# Patient Record
Sex: Female | Born: 1970 | Race: White | Hispanic: No | Marital: Married
Health system: Midwestern US, Academic
[De-identification: ages and names within clinical notes are randomized; demographics above are authoritative.]

## PROBLEM LIST (undated history)

## (undated) DIAGNOSIS — J45909 Unspecified asthma, uncomplicated: Secondary | ICD-10-CM

## (undated) LAB — PFT13-PULMONARY FUNCTION TEST
DLCO%: 124
DLCO: 26.2 ml/mmHg sec
FEV1%: 56
FEV1/FVC EXP: 81.03
FEV1/FVC: 48.69 %
FEV1: 1.73 liters
FVC%: 92
FVC: 3.56 liters
RV%: 149
RV: 2.8
TLC%: 107
TLC: 6.15 liters
VC%: 87
VC: 3.35

## (undated) LAB — TOX 9, UR
Amphetamine: >20000 ng/mL
Amphetamine: POSITIVE
Barbiturates: NEGATIVE
Benzodiazepines: NEGATIVE
Cocaine: NEGATIVE
Creatinine, Urine: 269 mg/dL
Mairjuana: NEGATIVE
Methadone, UR: NEGATIVE
Methamphetamine: NEGATIVE
Opiate Quant, Ur: NEGATIVE
PCP Quant, Ur: NEGATIVE
Propoxyphene: NEGATIVE

## (undated) LAB — HM COLOGUARD: HM ColoGuard: NEGATIVE

## (undated) LAB — HM PAP SMEAR
HM Pap smear: NEGATIVE
HM Pap smear: NEGATIVE
HM Pap smear: NORMAL

## (undated) LAB — TSH: TSH: 2.01 u[IU]/mL (ref 0.41–5.90)

## (undated) LAB — FERRITIN: Ferritin: 95

## (undated) LAB — VITAMIN D 25 HYDROXY: Vit D, 25-Hydroxy: 32.1

## (undated) MED FILL — DEXTROAMPHETAMINE-AMPHETAMINE 20 MG TABLET: 20 20 mg | ORAL | 30 days supply | Qty: 30 | Fill #0

## (undated) MED FILL — PEG 3350-ELECTROLYTES 236 GRAM-22.74 GRAM-6.74 GRAM-5.86 GRAM SOLUTION: 236-22.74-6.74 236-22.74-6.74 -5.86 gram | ORAL | 1 days supply | Qty: 4000 | Fill #0

## (undated) MED FILL — ALBUTEROL SULFATE HFA 90 MCG/ACTUATION AEROSOL INHALER: 90 90 mcg/actuation | RESPIRATORY_TRACT | 25 days supply | Qty: 6.7 | Fill #0

## (undated) MED FILL — MONTELUKAST 10 MG TABLET: 10 10 mg | ORAL | 90 days supply | Qty: 90

## (undated) MED FILL — BUPROPION HCL XL 150 MG 24 HR TABLET, EXTENDED RELEASE: 150 150 MG | ORAL | 90 days supply | Qty: 90 | Fill #0

## (undated) MED FILL — ESCITALOPRAM 20 MG TABLET: 20 20 MG | ORAL | 90 days supply | Qty: 90

## (undated) MED FILL — ESCITALOPRAM 20 MG TABLET: 20 20 MG | ORAL | 90 days supply | Qty: 90 | Fill #0

---

## 2005-06-18 NOTE — Unmapped (Signed)
Signed by Cleone Slim MD on 06/18/2005 at 15:15:58      Reason for Visit   Chief Complaint: establishing new pcp    History from: patient    Allergies  No Known Allergies    Medications     LEXAPRO 20 MG TABS (ESCITALOPRAM OXALATE) 1 tab po qd  ADDERALL XR 30 MG CP24 (AMPHETAMINE-DEXTROAMPHETAMINE) 1 tab po qd  ADDERALL 5 MG TABS (AMPHETAMINE-DEXTROAMPHETAMINE) 1 tab po qd  ALBUTEROL SULFATE NEBU (ALBUTEROL SULFATE NEBU) 1-2 puffs prn  ADVAIR DISKUS 250-50 MCG/DOSE MISC (FLUTICASONE-SALMETEROL) 1 puff qam 1 puff qhs  CLARITIN 10 MG TABS (LORATADINE) 1 tab po qd        Vital Signs:   Wt: 124 lbs.      Pulse: 86 (regular)  Resp: 16  BP: 110/62    Pulse Oximetry:   O2 Saturation: 100 % w/ room air Intake recorded by: Les Pou on June 18, 2005 10:20 AM    History of Present Illness: ADD    concentration problems: ongoing despite tx  dx 1.5 yrs ago  tx: adderall helpful x as above (no s/e reported); finds sx in pm less well controlled w/ 5mg  pm dose  past: stratera: ineffective  concerta: migraines        ATTENTION DEFICIT/ HYPERACTIVE SYMPTOMS     Depression   Chief Complaint: depression  Basis for diagnosis: previous history of depression    Associated Symptoms:   Complains of: feeling depressed  Denies: no S/I  Other: no real anxiety  Non-pharmacologic treatment: counseling, exercise  Compliance with tx: good  Medication Tolerance: good  Comments: counselling: marital    stressors: moved here from [place], marital issues, family health issues  Symptoms are: getting worse    Past History  Past Medical History:  Environmental Allergies, Asthma, cervical dysplasia, Anxiety, Depression, ADD    Surgical History:  conization 1991      Family History: father: htn  mother: cancer (?uterine)  siblings: A&W    Social History: Marital Status: married,   Employment Status: homemaker,   Alcohol Use: occasionally  Tobacco Usage:non-smoker      Review of Systems   General: Denies fatigue, weight loss, weight gain.      Respiratory: inc sx since ran out v advair & recent bronchitis (past family doctor suggested advair 500 might be more effective); peak flows usually 300's, not checkign recently  Psychiatric: Complains of see HPI.   Allergic/Immunologic: Complains of hay fever. (mild: compliant with otc claritin)      Physical Examination:     General Appearance: well developed, well nourished, in no acute distress.    Skin Palpation: Normal turgor.    Respiratory:     Effort: No apparent respiratory distress.  Auscultation: Clear to auscultation.    Cardiovascular:     Auscultation: No murmur, normal s1, normal s2.  Edema/Veins: No edema.    Abdomen   Palpation/Auscultation: Non-tender, normal bowel sounds.  Liver/Spleen:  : No organomegaly.    Psychological     Judgement/Insight: behavior within normal limits.    Orientation: oriented to person, oriented to place, oriented to time.    Memory: intact to recent events, intact to remote events.    Mood: appropriate mental status.      Assessment and Plan  Status of Existing Problems:  Assessed ADD as deteriorated - Ceasar Decandia [name] MD  Assessed DEPRESSION as deteriorated - Wilmetta Speiser [name] MD  Assessed ASTHMA NOS W/O STATUS ASTHMATICUS as deteriorated - Nitza Schmid  [name] MD  New Problems:  Dx of ASTHMA NOS W/O STATUS ASTHMATICUS (ICD-493.90)   Dx of ADD (ICD-314.00)   Dx of DEPRESSION (ICD-311)     Medications   New Prescriptions/Refills:  ADVAIR DISKUS 500-50 MCG/DOSE MISC (FLUTICASONE-SALMETEROL) 1 puff bid  #1 diskus x 0 : Alston Berrie [name] MD (06/18/2005)  ADDERALL (10MG )  TAB (AMPHETAMINE-DEXTROAMPHETAMINE) 1 by mouth every afternoon uad  #30 x 0 : Talissa Apple [name] MD (06/18/2005)  ADDERALL XR 30 MG CP24 (AMPHETAMINE-DEXTROAMPHETAMINE) 1 tab po qd  #30 x 0 : Cassondra Stachowski K Lenni Reckner MD (06/18/2005)  ALBUTEROL SULFATE NEBU (ALBUTEROL SULFATE NEBU) 1-2 puffs prn  #30 x 5 : Ethal Gotay K Karena Kinker MD (06/18/2005)  LEXAPRO 20 MG TABS (ESCITALOPRAM OXALATE) 1 tab po qd  #30 x 5 : Sahaj Bona K Teniyah Seivert MD  (06/18/2005)  WELLBUTRIN XL 150 MG TB24 (BUPROPION HCL) one po qd  #30 x 5 : Ailen Strauch [name] MD (06/18/2005)    New medications:  LEXAPRO 20 MG TABS -- 1 tab po qd  ADDERALL XR 30 MG CP24 -- 1 tab po qd  ADDERALL (10MG )  TAB -- 1 by mouth every afternoon uad  Start date: 06/18/2005  ALBUTEROL SULFATE NEBU -- 1-2 puffs prn  ADVAIR DISKUS 500-50 MCG/DOSE MISC -- 1 puff bid  Start date: 06/18/2005  CLARITIN 10 MG TABS -- 1 tab po qd  WELLBUTRIN XL 150 MG TB24 -- one po qd  Start date: 06/18/2005    Assessment and Plan Comments     1035  get records  depn: add WBXL low dose, cont lexapro & councelling  add: inc pm dose v adderall  asthma: temp'ly inc advair to 500.  PREVENTATIVE FEMALE:  pap smear:utd  lipids: utd    Today's Orders   14782 - Ofc Vst, New Level III [CPT-99203]    Disposition:   Return to clinic for Doctor Visit in 1 month(s)               Prescriptions:  ADVAIR DISKUS 500-50 MCG/DOSE MISC (FLUTICASONE-SALMETEROL) 1 puff bid  #1 diskus x 0   Entered and Authorized by: Keiasia Christianson [name] MD   Signed by: Cleone Slim MD on 06/18/2005   Method used: Faxed to ...     [facility]     [address]     [place], [place]  [address]     Ph: [phone]     Fax: [phone]  ADDERALL (10MG )  TAB (AMPHETAMINE-DEXTROAMPHETAMINE) 1 by mouth every afternoon uad  #30 x 0   Entered and Authorized by: Pranish Akhavan [name] MD   Signed by: Cleone Slim MD on 06/18/2005   Method used: Print then Give to Patient  ADDERALL XR 30 MG CP24 (AMPHETAMINE-DEXTROAMPHETAMINE) 1 tab po qd  #30 x 0   Entered and Authorized by: Carston Riedl [name] MD   Signed by: Cleone Slim MD on 06/18/2005   Method used: Print then Give to Patient  ALBUTEROL SULFATE NEBU (ALBUTEROL SULFATE NEBU) 1-2 puffs prn  #30 x 5   Entered and Authorized by: Jamise Pentland [name] MD   Signed by: Cleone Slim MD on 06/18/2005   Method used: Faxed to ...     [facility] [address]     [address]     [place], [place]   [address]     Ph: [phone]     Fax: [phone]  LEXAPRO 20 MG TABS (ESCITALOPRAM OXALATE) 1 tab po qd  #  30 x 5   Entered and Authorized by: Kyrell Ruacho [name] MD   Signed by: Cleone Slim MD on 06/18/2005   Method used: Faxed to ...     [facility] [address]     [address]     [place], [place]  [address]     Ph: [phone]     Fax: [phone]  WELLBUTRIN XL 150 MG TB24 (BUPROPION HCL) one po qd  #30 x 5   Entered and Authorized by: Jantz Main [name] MD   Signed by: Cleone Slim MD on 06/18/2005   Method used: Faxed to ...     [facility] [address]     [address]     [place], [place]  [address]     Ph: [phone]     Fax: [phone]            ]

## 2005-06-23 NOTE — Unmapped (Signed)
Signed by Les Pou on 06/24/2005 at 13:39:42    Phone Note   Call from Pharmacy  Department: Family Medicine  Call for: [facility]    Caller: [facility]  Pharmacy Phone Number: [phone]    FAX: [phone]  Summary of call: WANT TO VERIFY    RCV'D RX FOR ALBUTERAL  NEBU -- DIRECTIONS OF 1-2 PUFFS.  DOES MD WANT NEBULIZER SOLN OR MDI?   Initial call taken by: Anders Simmonds,  June 23, 2005 11:29 AM    Follow-up for Phone Call   ofc to call pt & clarify refill  Follow-up by: Cleone Slim MD,  June 23, 2005 3:10 PM    Additional Follow-up for Phone Call   called patient and left message  Additional Follow-up by: Tia Little,  June 23, 2005 5:22 PM    Additional Follow-up for Phone Call   called patient and left message to call ofc back in regards to rx clarification; no answer; will wait for response  Additional Follow-up by: Tia Little,  June 24, 2005 1:39 PM

## 2005-07-01 NOTE — Unmapped (Signed)
Signed by Lynetta Mare MA on 07/01/2005 at 17:51:54    Phone Note   Call from Pharmacy  Department: Family Medicine  Call for: [facility]    Caller: [facility]  Pharmacy Phone Number: [phone]  Summary of call: NEED TO VERIFY ALBUTEROL - NEBULIZER OR INHALER  Initial call taken by: Verlene Mayer,  July 01, 2005 8:51 AM    Follow-up for Phone Call   called and verified rx with pharmacy  Follow-up by: Fulton Mole MA,  July 01, 2005 5:51 PM

## 2005-07-16 NOTE — Unmapped (Signed)
Signed by Cleone Slim MD on 07/16/2005 at 14:57:33    [facility]  [address]  [address]  [place], [place] [address]  Ph: [phone]  Fax: [phone]     July 16, 2005        [facility]  [address] [place], [place] [address]          RE:  TEST RESULTS    Dear  Ms. Leatherbury,    The following is an interpretation of your most recent tests.  Please take note of any instructions provided or changes to medications that have resulted from your lab work.      X-RAY: Xray results were normal.      Sincerely,        Britteny Fiebelkorn [name] MD

## 2005-07-16 NOTE — Unmapped (Signed)
Signed by Cleone Slim MD on 07/16/2005 at 14:56:33  Patient: Mary [name]  Note: All result statuses are Final unless otherwise noted.    Tests: (1) DIAG-HIP MIN 2-VIEWS LT (562130)    Order NotePricilla Handler Order Number: [id number]    Order Note:     *** VERIFIED ***  [facility]  Reason:  PAIN  Dict.Staff: OJEDA-FOURNIER, HAYDEE    Verified By: Lovenia Shuck   Ver: 07/16/05   2:24 pm  Exams:  DIAG-HIP MIN 2-VIEWS LT      Left hip 2 views dated 07/16/2005.    Clinical History: 34 year old female left hip pain.    Findings:    There are no osseous, articular, or soft tissue abnormalities.    Impression:    Normal appearance to the left hip.  **** end of result ****    Note: An exclamation mark (!) indicates a result that was not dispersed into   the flowsheet.  Document Creation Date: 07/16/2005 2:22 PM  _______________________________________________________________________    (1) Order result status: Final  Collection or observation date-time: 07/16/2005 12:19:00  Requested date-time: 07/16/2005 12:19:00  Receipt date-time:   Reported date-time: 07/16/2005 14:24:43  Referring Physician: Cindi Carbon NON-STAFF  Ordering Physician: Melissa Montane [facility])  Specimen Source:   Source: Duanne Guess Order Number: [id number]  Lab site: [facility]

## 2005-07-16 NOTE — Unmapped (Signed)
Signed by Cleone Slim MD on 07/16/2005 at 12:12:47      Reason for Visit   Chief Complaint: evaluate medications    History from: patient    Allergies  No Known Allergies    Medications   LEXAPRO 20 MG TABS (ESCITALOPRAM OXALATE) 1 tab po qd  ADDERALL XR 30 MG CP24 (AMPHETAMINE-DEXTROAMPHETAMINE) 1 tab po qd  ADDERALL (10MG )  TAB (AMPHETAMINE-DEXTROAMPHETAMINE) 1 by mouth every afternoon uad  ALBUTEROL SULFATE NEBU (ALBUTEROL SULFATE NEBU) 1-2 puffs prn  ADVAIR DISKUS 500-50 MCG/DOSE MISC (FLUTICASONE-SALMETEROL) 1 puff bid  CLARITIN 10 MG TABS (LORATADINE) 1 tab po qd  WELLBUTRIN XL 150 MG TB24 (BUPROPION HCL) one po qd        Vital Signs:   Wt: 126 lbs.      Pulse: 68    Blood Pressure #1: 124/90  Blood Pressure #2: 118/86    Calculations:   Wt chg (lbs): 2  Intake recorded by: Fulton Mole MA on July 16, 2005 11:04 AM          HPI Multiple Chronic   Condition(s): Anxiety/Depression, Asthma.  Additional Dx: ADD  Comments: last visit inc adderall b/o afternoon sx  also started WBXL    Current Status:   Compliance with tx: good  Medication Tolerance: good    ROS/Symptoms:   Patient denies the following respiratory symptoms: uri exposure: sl inc chest tightness  no fever, wheezing; not checking pefr  Patient denies the following psychological symptoms: depression better  concentration better       Review of Systems   Cardiovascular: recent brief (?3 mins) palitaitons, ? irreg; spon resolution, no recurrence since then  Respiratory: Complains of see HPI.   Musculoskeletal: l hip: 12 months, past present during pregnancies; worse w/ prolonged sitting  Psychiatric: Complains of see HPI.       Physical Examination:     General Appearance: well developed, well nourished, in no acute distress.      Neck/Thyroid   Neck/Thyroid:  No thyromegaly.    Respiratory:     Effort: No apparent respiratory distress.  Auscultation: Clear to auscultation.    Cardiovascular:     Auscultation: No murmur, normal s1, normal  s2.    Psychological     Judgement/Insight: Behavior within normal limits.  Mood: Appropriate mental status.      Extremities   LLE Inspection/Palpation Intact, no tenderness.  LLE Range of Motion: Full rom.some discomfort v l hip w/ ext rot'n  Assessment and Plan  Status of Existing Problems:  Assessed ADD as improved - Miriya Cloer [name] MD  Assessed DEPRESSION as improved - Javohn Basey [name] MD  Assessed ASTHMA NOS W/O STATUS ASTHMATICUS as unchanged - Kareli Hossain [name] MD  New Problems:  Dx of HIP PAIN [phone])  Onset: 07/16/2005    Medications   New Prescriptions/Refills:  ADVAIR DISKUS 250-50 MCG/DOSE MISC (FLUTICASONE-SALMETEROL) 1 inhale twice a day  #1 diskus x 4 : Khalil Szczepanik [name] MD (07/16/2005)  ADVAIR DISKUS 500-50 MCG/DOSE MISC (FLUTICASONE-SALMETEROL) 1 puff bid  #1 diskus x 0 : Ludwin Flahive [name] MD (07/16/2005)  ADDERALL (10MG )  TAB (AMPHETAMINE-DEXTROAMPHETAMINE) 1 by mouth every afternoon uad  #30 x 0 : Leelynd Maldonado K Oksana Deberry MD (07/16/2005)  ADDERALL XR 30 MG CP24 (AMPHETAMINE-DEXTROAMPHETAMINE) 1 tab po qd  #30 x 0 : Merick Kelleher [name] MD (07/16/2005)    New medications:  ADVAIR DISKUS 250-50 MCG/DOSE MISC -- 1 inhale twice a day    Assessment and Plan Comments  1. depn: cont w/ current tx  2. VOZ:DGUY w/ current tx, can try dec pm dose v adderall later  3. asthma: cont advair 500 b/o viral exposure for now then dec back to 250.; monitor pefr  4. palpitations: reassured as single episode  5. l  hip pain: xray, prn otc nsaids    Today's Orders   X-ray, Hip, complete, 2+ views [CPT-73510]  99214 - Ofc Vst, Est Level IV [[id number]]    Disposition:   Return to clinic for Doctor Visit in 1 month(s)   Appointment Reason: add & asthma & hip pain              Prescriptions:  ADVAIR DISKUS 250-50 MCG/DOSE MISC (FLUTICASONE-SALMETEROL) 1 inhale twice a day  #1 diskus x 4   Entered and Authorized by: Serra Younan [name] MD   Signed by: Cleone Slim MD on 07/16/2005   Method used: Print then Give to Patient  ADVAIR DISKUS 500-50 MCG/DOSE  MISC (FLUTICASONE-SALMETEROL) 1 puff bid  #1 diskus x 0   Entered and Authorized by: Daquavion Catala [name] MD   Signed by: Cleone Slim MD on 07/16/2005   Method used: Print then Give to Patient  ADDERALL (10MG )  TAB (AMPHETAMINE-DEXTROAMPHETAMINE) 1 by mouth every afternoon uad  #30 x 0   Entered and Authorized by: Corderro Koloski [name] MD   Signed by: Cleone Slim MD on 07/16/2005   Method used: Print then Give to Patient  ADDERALL XR 30 MG CP24 (AMPHETAMINE-DEXTROAMPHETAMINE) 1 tab po qd  #30 x 0   Entered and Authorized by: Tron Flythe [name] MD   Signed by: Cleone Slim MD on 07/16/2005   Method used: Print then Give to Patient            ]

## 2005-08-17 NOTE — Unmapped (Signed)
Signed by Les Pou on 08/17/2005 at 16:33:55    Phone Note   Call from Patient  Call back at Home Phone: [phone]  Caller: patient  Department: Family Medicine  Call for: [facility]    Summary of Call:  *** Prescription Request ***    Requesting Medication:  ADAEROL XR  Rx. Number:     Dose/ Strength: 30MG    Instructions: 1 DAILY    Quantity:    Last Filled Date:    Last Filled By:    Pharmacy Name:    Pharmacy Phone #:    Pharmacy Fax #:    UCP Provider Name:       *** Prescription Request ***    Requesting Medication:  ADEROL  Rx. Number:     Dose/ Strength: 10MG    Instructions: 1 DAILY   Quantity:    Last Filled Date:    Last Filled By:    Pharmacy Name:    Pharmacy Phone #:    Pharmacy Fax #:    UCP Provider Name:         PLEASE CALL WHEN RX READY FOR PICK UP          Initial call taken by: Anders Simmonds,  August 17, 2005 1:11 PM    Prescriptions:  ADDERALL (10MG )  TAB (AMPHETAMINE-DEXTROAMPHETAMINE) 1 by mouth every afternoon uad  #30 x 0   Entered by: Tia Little   Authorized by: Pranathi Winfree [name] MD   Signed by: Les Pou on 08/17/2005   Method used: Print then Give to Patient  ADDERALL XR 30 MG CP24 (AMPHETAMINE-DEXTROAMPHETAMINE) 1 tab po qd  #30 x 0   Entered by: Tia Little   Authorized by: Vivika Poythress [name] MD   Signed by: Les Pou on 08/17/2005   Method used: Print then Give to Patient

## 2005-09-20 NOTE — Unmapped (Signed)
Signed by Cleone Slim MD on 09/20/2005 at 16:30:46    Phone Note   Call from Patient  Call back at Home Phone: [phone]  Patient Cell Phone #: [phone]  Caller: patient  Department: Family Medicine  Call for: [facility]    Summary of Call:  ADDERALL 30MG    ADDERALL 10MG     PT WANTS WANTS TO PICK UP SCRIPTS FOR THEM. CALL HER  AT HOME OR CELL PHONE TO LET HER KNOW WHEN TO PICK  UP.       Initial call taken by: Earnest Conroy,  September 20, 2005 10:41 AM      Follow-up for Phone Call   1 month refll ok  must b seen next month for refills  Follow-up by: Cleone Slim MD,  September 20, 2005 4:30 PM            Prescriptions:  ADDERALL (10MG )  TAB (AMPHETAMINE-DEXTROAMPHETAMINE) 1 by mouth every afternoon uad  #30 x 0   Entered and Authorized by: Zymier Rodgers [name] MD   Signed by: Cleone Slim MD on 09/20/2005   Method used: Print then Give to Patient  ADDERALL XR 30 MG CP24 (AMPHETAMINE-DEXTROAMPHETAMINE) 1 tab po qd  #30 x 0   Entered and Authorized by: Moshe Wenger [name] MD   Signed by: Cleone Slim MD on 09/20/2005   Method used: Print then Give to Patient

## 2005-10-22 NOTE — Unmapped (Signed)
Signed by Cleone Slim MD on 10/22/2005 at 16:19:04      Reason for Visit   Chief Complaint: medication Adderall follow up. c/o back, jaw and bilateral shoulder pain    History from: patient    Allergies  No Known Allergies    Medications   LEXAPRO 20 MG TABS (ESCITALOPRAM OXALATE) 1 tab po qd  ADDERALL XR 30 MG CP24 (AMPHETAMINE-DEXTROAMPHETAMINE) 1 tab po qd  ADDERALL (10MG )  TAB (AMPHETAMINE-DEXTROAMPHETAMINE) 1 by mouth every afternoon uad  ALBUTEROL SULFATE NEBU (ALBUTEROL SULFATE NEBU) 1-2 puffs prn  ADVAIR DISKUS 500-50 MCG/DOSE MISC (FLUTICASONE-SALMETEROL) 1 puff bid  CLARITIN 10 MG TABS (LORATADINE) 1 tab po qd  WELLBUTRIN XL 150 MG TB24 (BUPROPION HCL) one po qd        Vital Signs:   Wt: 132 lbs.      Temperature: 98.1  degrees F   oral  Pulse: 64  BP: 122/82    Calculations:   Wt chg (lbs): 6  Intake recorded by: Verl Dicker on October 22, 2005 2:46 PM          ATTENTION DEFICIT/ HYPERACTIVE SYMPTOMS          FU ADD    Adderall 30mg  qam, 10mg  qpm: doing well x has noticed ? inc circ sx (see ROS).    Past History  Past Medical History (reviewed - no changes required):  Environmental Allergies, Asthma, cervical dysplasia, Anxiety, Depression, ADD    Social History (reviewed - no changes required): Marital Status: married,   Employment Status: employed part-time,   Occupation: Chemical engineer  Alcohol Use: occasionally  Tobacco Usage:non-smoker      Review of Systems   Cardiovascular: episodic hands blood rush: better w/ putting hands in cold water!; not positional  Musculoskeletal: recently having neck/back pain; + h/o TMJ; past NSAIDS +/- t#3  Psychiatric: Complains of see HPI.       Physical Examination:     General Appearance: well developed, well nourished, in no acute distress.      Cardiovascular:     Right Pedal/Radial Pulses: symmetric radial pulse + 2.  normal cap refill  Left Pedal/Radial Pulses: symmetric radial pulse + 2.  normal cap refill    Psychological     Judgement/Insight: good  insight and judgement.    Mood: affect and mood appropriate.      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)     Medications   New Prescriptions/Refills:  TYLENOL/CODEINE #3 TABS (ACETAMINOPHEN-CODEINE TABS) 1 po tid prn pain  #30 x 0 : Markitta Ausburn [name] MD (10/22/2005)  DAYPRO 600 MG TABS (OXAPROZIN) 1 po BID with food prn  #60 x 0 : Nicole Hafley K Makira Holleman MD (10/22/2005)  WELLBUTRIN XL 150 MG TB24 (BUPROPION HCL) one po qd  #30 x 5 : Jessiah Steinhart K Foxx Klarich MD (10/22/2005)  LEXAPRO 20 MG TABS (ESCITALOPRAM OXALATE) 1 tab po qd  #30 x 5 : Roy Tokarz K Marvell Stavola MD (10/22/2005)  ADVAIR DISKUS 500-50 MCG/DOSE MISC (FLUTICASONE-SALMETEROL) 1 puff bid  #1 diskus x 5 : Zani Kyllonen [name] MD (10/22/2005)  ADDERALL (10MG )  TAB (AMPHETAMINE-DEXTROAMPHETAMINE) 1 by mouth every afternoon uad  #30 x 0 : Rickeya Manus [name] MD (10/22/2005)  ADDERALL XR 30 MG CP24 (AMPHETAMINE-DEXTROAMPHETAMINE) 1 tab po qd  #30 x 0 : Kacin Dancy K Thedore Mins MD (10/22/2005)  ADDERALL (10MG )  TAB (AMPHETAMINE-DEXTROAMPHETAMINE) 1 by mouth every afternoon uad  #30 x 0 : Vadhir Mcnay K Latrell Potempa MD (10/22/2005)  ADDERALL XR 30 MG CP24 (AMPHETAMINE-DEXTROAMPHETAMINE) 1 tab po qd  #30 x 0 : Taimane Stimmel K Jacarra Bobak MD (10/22/2005)  ADDERALL (10MG )  TAB (AMPHETAMINE-DEXTROAMPHETAMINE) 1 by mouth every afternoon uad  #30 x 0 : Danuel Felicetti K Shatonya Passon MD (10/22/2005)  ADDERALL XR 30 MG CP24 (AMPHETAMINE-DEXTROAMPHETAMINE) 1 tab po qd  #30 x 0 : Maricus Tanzi [name] MD (10/22/2005)    New medications:  DAYPRO 600 MG TABS -- 1 po BID with food prn  Start date: 10/22/2005  TYLENOL/CODEINE #3 TABS -- 1 po tid prn pain  Start date: 10/22/2005    Assessment and Plan Comments       1.  ADD; stable w/ current tx, cont  2. ? Raynauds: not typical. reluctant to try nifedipine, refer to vasc  surgeon  3. Depn: refill meds  4. asthma: refill advair  5. TMJ: prn DP & T#3 prn    Today's Orders   Vascular Consult [[id number]]  54098 - Ofc Vst, Est Level IV [[id number]]    Disposition:   Return to clinic for Doctor Visit in 3 month(s)               Prescriptions:  TYLENOL/CODEINE #3 TABS (ACETAMINOPHEN-CODEINE TABS) 1 po tid prn pain  #30 x 0   Entered and Authorized by: Keylie Beavers [name] MD   Signed by: Cleone Slim MD on 10/22/2005   Method used: Print then Give to Patient  DAYPRO 600 MG TABS (OXAPROZIN) 1 po BID with food prn  #60 x 0   Entered and Authorized by: Ranessa Kosta [name] MD   Signed by: Cleone Slim MD on 10/22/2005   Method used: Print then Give to Patient  WELLBUTRIN XL 150 MG TB24 (BUPROPION HCL) one po qd  #30 x 5   Entered and Authorized by: Denny Lave [name] MD   Signed by: Cleone Slim MD on 10/22/2005   Method used: Print then Give to Patient  LEXAPRO 20 MG TABS (ESCITALOPRAM OXALATE) 1 tab po qd  #30 x 5   Entered and Authorized by: Fayette Gasner [name] MD   Signed by: Cleone Slim MD on 10/22/2005   Method used: Print then Give to Patient  ADVAIR DISKUS 500-50 MCG/DOSE MISC (FLUTICASONE-SALMETEROL) 1 puff bid  #1 diskus x 5   Entered and Authorized by: Arlin Savona [name] MD   Signed by: Cleone Slim MD on 10/22/2005   Method used: Print then Give to Patient  ADDERALL (10MG )  TAB (AMPHETAMINE-DEXTROAMPHETAMINE) 1 by mouth every afternoon uad  #30 x 0   Entered and Authorized by: Amya Hlad [name] MD   Signed by: Cleone Slim MD on 10/22/2005   Method used: Print then Give to Patient  ADDERALL XR 30 MG CP24 (AMPHETAMINE-DEXTROAMPHETAMINE) 1 tab po qd  #30 x 0   Entered and Authorized by: Blanca Thornton [name] MD   Signed by: Cleone Slim MD on 10/22/2005   Method used: Print then Give to Patient  ADDERALL (10MG )  TAB (AMPHETAMINE-DEXTROAMPHETAMINE) 1 by mouth every afternoon uad  #30 x 0   Entered and Authorized by: Xitlali Kastens [name] MD   Signed by: Cleone Slim MD on 10/22/2005   Method used: Print then Give to  Patient  ADDERALL XR 30 MG CP24 (AMPHETAMINE-DEXTROAMPHETAMINE) 1 tab po qd  #30 x 0   Entered and Authorized by: Ahniya Mitchum [name] MD   Signed by: Cleone Slim MD on 10/22/2005   Method used: Print then Give to Patient  ADDERALL (10MG )  TAB (AMPHETAMINE-DEXTROAMPHETAMINE) 1 by mouth every afternoon uad  #30 x 0   Entered and Authorized by: Aldyn Toon [name] MD   Signed by: Cleone Slim MD on 10/22/2005   Method used: Print then Give to Patient  ADDERALL XR 30 MG CP24 (AMPHETAMINE-DEXTROAMPHETAMINE) 1 tab po qd  #30 x 0   Entered and Authorized by: Xzayvier Fagin [name] MD   Signed by: Cleone Slim MD on 10/22/2005   Method used: Print then Give to Patient            ]

## 2006-01-24 NOTE — Unmapped (Signed)
Signed by Cleone Slim MD on 01/24/2006 at 12:38:47    Phone Note   Patient Call  Callers Cell Phone #: [phone]  Caller: patient  Department: Family Medicine  Call for: [facility]    Reason for Call: refill medication  Summary of Call: ADEROL XR 1 X DAY  ADEROL 10MG  1 X DAY   COMPLETELY OUT OF ADEROL XR  PLEASE CALL : [phone] WHEN READY FOR PICK UP            Initial call taken by: Anders Simmonds,  January 24, 2006 9:57 AM      Follow-up for Phone Call   we cant phone in adderall    must make a fu appt for refills  Follow-up by: Cleone Slim MD,  January 24, 2006 10:42 AM  Prescriptions:  ADDERALL (10MG )  TAB (AMPHETAMINE-DEXTROAMPHETAMINE) 1 by mouth every afternoon uad  #30 x 0   Entered and Authorized by: Deaunna Olarte [name] MD   Signed by: Cleone Slim MD on 01/24/2006   Method used: Print then Give to Patient  ADDERALL XR 30 MG CP24 (AMPHETAMINE-DEXTROAMPHETAMINE) 1 tab po qd  #30 x 0   Entered and Authorized by: Decorey Wahlert [name] MD   Signed by: Cleone Slim MD on 01/24/2006   Method used: Print then Give to Patient

## 2006-02-17 NOTE — Unmapped (Signed)
Signed by Verl Dicker MA on 02/18/2006 at 18:36:25    Phone Note   Patient Call  Callers Cell Phone #: [phone]  Caller: patient  Department: Family Medicine  Call for: [facility]    Reason for Call: refill medication  Summary of Call: REFILL:    ADDERALL XR AND ADDERALL 10MG   ONLY 1 OR 2 TAB LEFT     CALL CELL WHEN READY FOR PICK UP      Initial call taken by: Anders Simmonds,  Feb 17, 2006 12:06 PM      Follow-up for Phone Call   can see tomorrow  its a restricted med: cant phone in, supposed to make appt for refills  Follow-up by: Cleone Slim MD,  Feb 17, 2006 1:00 PM    Additional Follow-up for Phone Call   Pt seen today in office.  phone call completed  Additional Follow-up by: Fulton Mole MA,  Feb 17, 2006 1:10 PM

## 2006-02-18 NOTE — Unmapped (Signed)
Signed by Cleone Slim MD on 02/18/2006 at 10:28:02      Reason for Visit   Chief Complaint: Adderall use follow up and refill.    History from: patient    Allergies  No Known Allergies    Medications   LEXAPRO 20 MG TABS (ESCITALOPRAM OXALATE) 1 tab po qd  ADDERALL XR 30 MG CP24 (AMPHETAMINE-DEXTROAMPHETAMINE) 1 tab po qd  ADDERALL (10MG )  TAB (AMPHETAMINE-DEXTROAMPHETAMINE) 1 by mouth every afternoon uad  ALBUTEROL SULFATE NEBU (ALBUTEROL SULFATE NEBU) 1-2 puffs prn  ADVAIR DISKUS 500-50 MCG/DOSE MISC (FLUTICASONE-SALMETEROL) 1 puff bid  CLARITIN 10 MG TABS (LORATADINE) 1 tab po qd  WELLBUTRIN XL 150 MG TB24 (BUPROPION HCL) one po qd        Vital Signs:   Wt: 127 lbs.      Temperature: 97.8  degrees F   oral  Pulse: 70  BP: 122/80    Pulse Oximetry:   O2 Saturation: 100 %   Calculations:   Wt chg (lbs): -5  Intake recorded by: Verl Dicker on Feb 18, 2006 9:53 AM    Peak Flow   Peak Flow: 380 (goal 500) L/min.          ATTENTION DEFICIT/ HYPERACTIVE SYMPTOMS            ADD: doing ok  concentration: able to stay focused but seems to forget things easily  Tx: Adderall dosing a/t work shifts w/ prn extra 10mg   s/e: none reported.    Past History  Past Medical History (reviewed - no changes required):  Environmental Allergies, Asthma, cervical dysplasia, Anxiety, Depression, ADD  Family History: father: htn  mother: cancer (?uterine)  siblings: A&W  children: asthma/ allergy  Social History (reviewed - no changes required): Marital Status: married,   Employment Status: employed part-time,   Occupation: Charity fundraiser @ [employer]  Alcohol Use: occasionally  Tobacco Usage:non-smoker    Review of Systems   Respiratory: recent mild inc in asthma sx: compliant with advair; using albutrol 1-2x/d  has PF meter, rarely checks it  Psychiatric: Complains of see HPI. depn well controlled w/ both lexapro & WBXL      Physical Examination:     General Appearance: well developed, well nourished, in no acute distress.      Respiratory:        Effort: No apparent respiratory distress.  Auscultation: Clear to auscultation.    Cardiovascular:     Auscultation: No murmur, normal s1, normal s2.    Psychological     Judgement/Insight: Good insight and judgement, behavior within normal limits.  Mood: Appropriate mental status.    Assessment and Plan  Status of Existing Problems:  Assessed ADD as deteriorated - Grizelda Piscopo [name] MD  Assessed DEPRESSION as improved - Ashlan Dignan [name] MD  Assessed ASTHMA NOS W/O STATUS ASTHMATICUS as deteriorated - Tonnie Stillman [name] MD    Medications   New Prescriptions/Refills:  SINGULAIR 10 MG TAB (MONTELUKAST SODIUM) 1 po daily  #30 x 5 : Jermone Geister [name] MD (02/18/2006)  ADDERALL (10MG )  TAB (AMPHETAMINE-DEXTROAMPHETAMINE) 1 by mouth every afternoon uad  #30 x 0 : Marshel Golubski [name] MD (02/18/2006)  ADDERALL (10MG )  TAB (AMPHETAMINE-DEXTROAMPHETAMINE) 1 by mouth every afternoon uad  #30 x 0 : Dashauna Heymann K Carroll Ranney MD (02/18/2006)  ADDERALL XR 15 MG CP24 (AMPHETAMINE-DEXTROAMPHETAMINE) 3 tablets by mouth daily  #90 x 0 : Amar Sippel [name] MD (02/18/2006)  ADDERALL (10MG )  TAB (AMPHETAMINE-DEXTROAMPHETAMINE) 1 by mouth every afternoon uad  #30  x 0 : Clarissa Laird [name] MD (02/18/2006)  ADDERALL XR 15 MG CP24 (AMPHETAMINE-DEXTROAMPHETAMINE) 3 tablets by mouth daily  #90 x 0 : Haedyn Breau K Yanelle Sousa MD (02/18/2006)  ADDERALL XR 15 MG CP24 (AMPHETAMINE-DEXTROAMPHETAMINE) 3 tablets by mouth daily  #90 x 0 : Deoni Cosey [name] MD (02/18/2006)    New medications:  ADDERALL XR 15 MG CP24 -- 3 tablets by mouth daily  Start date: 02/18/2006  SINGULAIR 10 MG TAB -- 1 po daily  Start date: 02/18/2006    Assessment and Plan Comments       1. ADD: inc to 45-55mg /d  2. asthma/allergy: continue w/ current treatment; add singulair  3. Depn: continue w/ current treatment       Today's Orders   99214 - Ofc Vst, Est Level IV [CPT-99214]  Peak Flow [CPT-94150]  Pulse oximetry [CPT-94760]    Disposition:   Return to clinic for Doctor Visit in 3 month(s)               Prescriptions:  SINGULAIR 10  MG TAB (MONTELUKAST SODIUM) 1 po daily  #30 x 5   Entered and Authorized by: Benjamim Harnish [name] MD   Signed by: Cleone Slim MD on 02/18/2006   Method used: Print then Give to Patient  ADDERALL (10MG )  TAB (AMPHETAMINE-DEXTROAMPHETAMINE) 1 by mouth every afternoon uad  #30 x 0   Entered and Authorized by: Marnisha Stampley [name] MD   Signed by: Cleone Slim MD on 02/18/2006   Method used: Print then Give to Patient  ADDERALL (10MG )  TAB (AMPHETAMINE-DEXTROAMPHETAMINE) 1 by mouth every afternoon uad  #30 x 0   Entered and Authorized by: Minnette Merida [name] MD   Signed by: Cleone Slim MD on 02/18/2006   Method used: Print then Give to Patient  ADDERALL XR 15 MG CP24 (AMPHETAMINE-DEXTROAMPHETAMINE) 3 tablets by mouth daily  #90 x 0   Entered and Authorized by: Millicent Blazejewski [name] MD   Signed by: Cleone Slim MD on 02/18/2006   Method used: Print then Give to Patient  ADDERALL (10MG )  TAB (AMPHETAMINE-DEXTROAMPHETAMINE) 1 by mouth every afternoon uad  #30 x 0   Entered and Authorized by: Eudell Mcphee [name] MD   Signed by: Cleone Slim MD on 02/18/2006   Method used: Print then Give to Patient  ADDERALL XR 15 MG CP24 (AMPHETAMINE-DEXTROAMPHETAMINE) 3 tablets by mouth daily  #90 x 0   Entered and Authorized by: Telisa Ohlsen [name] MD   Signed by: Cleone Slim MD on 02/18/2006   Method used: Print then Give to Patient  ADDERALL XR 15 MG CP24 (AMPHETAMINE-DEXTROAMPHETAMINE) 3 tablets by mouth daily  #90 x 0   Entered and Authorized by: Nox Talent [name] MD   Signed by: Cleone Slim MD on 02/18/2006   Method used: Print then Give to Patient            ]

## 2006-05-19 NOTE — Unmapped (Signed)
Signed by Kerrie Pleasure MA on 05/20/2006 at 08:56:47    Phone Note   Patient Call  Call back at Home Phone: [phone]  Caller: patient  Department: Family Medicine  Call for: [facility]    Reason for Call: referral  Summary of Call: *** Prescription Request ***    Requesting Medication:  ADEROL XR  Rx. Number:     Dose/ Strength: 45MG    Instructions:3 15MG S X A DAY    Quantity:    Last Filled Date:    Last Filled By:    Pharmacy Name:    Pharmacy Phone #:    Pharmacy Fax #:    UCP Provider Name:         *** Prescription Request ***    Requesting Medication:  ADEROL   Rx. Number:     Dose/ Strength: 10MG     Instructions:    Quantity:    Last Filled Date:    Last Filled By:    Pharmacy Name:    Pharmacy Phone #:    Pharmacy Fax #:    UCP Provider Name:         WOULD LIKE TO PICK UP TOMORROW MORNING   GOING OUT OF TOWN .Marland KitchenMarland Kitchen         Initial call taken by: Anders Simmonds,  May 19, 2006 4:23 PM      Follow-up for Phone Call   rx printed, can pick up tomorrow  Please call our office to make a follow up appointment.   pls don't call for refills of this medicine in the future: it is a restricted med & the medical board frowns on phone in refills...when its time for a refill its time to be seen   Follow-up by: Cleone Slim MD,  May 19, 2006 5:43 PM    Additional Follow-up for Phone Call   left message for patient  Additional Follow-up by: Fulton Mole MA,  May 19, 2006 6:41 PM    Additional Follow-up for Phone Call   PT INFORMED - WILL PICK UP RX & SCHED APPT  Additional Follow-up by: Verlene Mayer,  May 20, 2006 8:19 AM    Additional Follow-up for Phone Call   this printed under my name yesterday for some reason.please do again  Additional Follow-up by: Kerrie Pleasure MA,  May 20, 2006 8:29 AM  Prescriptions:  ADDERALL (10MG )  TAB (AMPHETAMINE-DEXTROAMPHETAMINE) 1 by mouth every afternoon uad  #30 x 0   Entered by: Kerrie Pleasure MA   Authorized by: Isahia Hollerbach [name] MD   Signed by: Kerrie Pleasure MA on 05/20/2006   Method  used: Print then Give to Patient  ADDERALL XR 15 MG CP24 (AMPHETAMINE-DEXTROAMPHETAMINE) 3 tablets by mouth daily  #90 x 0   Entered by: Kerrie Pleasure MA   Authorized by: Drishti Pepperman [name] MD   Signed by: Kerrie Pleasure MA on 05/20/2006   Method used: Print then Give to Patient  ADDERALL XR 15 MG CP24 (AMPHETAMINE-DEXTROAMPHETAMINE) 3 tablets by mouth daily  #90 x 0   Entered by: Debbie Yearick [name] MD   Authorized by: Kerrie Pleasure MA   Signed by: Cleone Slim MD on 05/19/2006   Method used: Print then Give to Patient  ADDERALL (10MG )  TAB (AMPHETAMINE-DEXTROAMPHETAMINE) 1 by mouth every afternoon uad  #30 x 0   Entered by: Unique Sillas [name] MD   Authorized by: Kerrie Pleasure MA   Signed by: Cleone Slim MD on 05/19/2006  Method used: Print then Give to Patient

## 2006-06-07 NOTE — Unmapped (Signed)
Signed by   LinkLog[employer] on 06/07/2006 at 11:35:08    Appointment status changed to no show by  [employer] on 06/07/2006 11:35 AM.    No Show Comments  ----------------  (UP) FOLLOW UP    Appointment Information  -----------------------  Appt Type:         Date:  Tuesday, June 07, 2006       Time:  11:15 AM for 15 min    Urgency:  Routine    Made By:  Verlene Mayer   To Visit:  [facility] K MD     Reason:  (UP) FOLLOW UP    Appt Comments  -------------  -- 06/07/06 11:35: ([facility]) NO SHOW --  (UP) FOLLOW UP  FOLLOW UP ADD    -- 06/03/06 9:24: ([facility]) BOOKED --  Routine  at 06/07/2006 11:15 AM for 15 min  (UP) FOLLOW UP  FOLLOW UP ADD

## 2006-06-22 NOTE — Unmapped (Signed)
Signed by Cleone Slim MD on 06/22/2006 at 15:05:44      Reason for Visit   Chief Complaint: allergies    History from: patient    Allergies  No Known Allergies    Medications   LEXAPRO 20 MG TABS (ESCITALOPRAM OXALATE) 1 tab po qd  ADDERALL (10MG )  TAB (AMPHETAMINE-DEXTROAMPHETAMINE) 1 by mouth every afternoon uad  ALBUTEROL SULFATE NEBU (ALBUTEROL SULFATE NEBU) 1-2 puffs prn  ADVAIR DISKUS 500-50 MCG/DOSE MISC (FLUTICASONE-SALMETEROL) 1 puff bid  CLARITIN 10 MG TABS (LORATADINE) 1 tab po qd  WELLBUTRIN XL 150 MG TB24 (BUPROPION HCL) one po qd  ADDERALL XR 15 MG CP24 (AMPHETAMINE-DEXTROAMPHETAMINE) 3 tablets by mouth daily  SINGULAIR 10 MG TAB (MONTELUKAST SODIUM) 1 po daily        Vital Signs:       Temperature: 98.1  degrees F  oral  Pulse: 73 (regular)    Blood Pressure #1: 130/90  Blood Pressure #2: 124/84    Pulse Oximetry:   O2 Saturation: 100 %       HPI Multiple Chronic   Condition(s): Anxiety/Depression.  Additional Dx: ADD, allergies/asthma    Current Status:   Compliance with tx: good  Medication Tolerance: good    ROS/Symptoms:   Patient denies any respiratory symptoms.  Patient denies the following psychological symptoms: depn/anxiety  Patient complains of the following psychological symptoms: concentration issues controlled w/ adderall w/o s/e  Denies other symptoms of: worsened allergy sx recently; past ? 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      Physical Examination:     Additional Vitals:   BP: 130/  90    Physical Exam- Detail:   General Appearance: well-developed, well-nourished  and in no acute distress.  Ears: No lesions.  Tympanic membranes translucent, non-bulging.  Canal walls pink, without discharge.  Hearing grossly intact.  Nose/Face: nasal mucosa pale but not swollen  Oral Cavity: Gums pink, good dentition.  Oral mucosa and tongue without lesions.  Respiratory: Respiration un-labored.  Lung fields clear to auscultation.  No wheezing, rales, rhonchi or pleural rub.  Cardiac: S1 and S2 normal.  RRR without murmurs, rubs, gallops.  No JVD.  Psychiatric: Judgement and insight are within normal limits.  Alert and oriented x3.  No mood disorders noted, appropriate affect.        New Medications:  NASONEX 50 MCG/ACT SUSPN (MOMETASONE FUROATE) one spray each nostril bid      Assessment and Plan  Status of Existing Problems:  Assessed ASTHMA NOS W/O STATUS ASTHMATICUS as comment only - Javayah Magaw [name] MD  Assessed DEPRESSION as improved - Lener Ventresca [name] MD  Assessed ADD as improved - Priest Lockridge [name] MD    Medications   New Prescriptions/Refills:  ADDERALL XR 15 MG CP24 (AMPHETAMINE-DEXTROAMPHETAMINE) 3 tablets by mouth daily  #90 x 0 : Laiana Fratus [name] MD (06/22/2006)  ADDERALL (10MG )  TAB (AMPHETAMINE-DEXTROAMPHETAMINE) 1 by mouth every afternoon uad  #30 x 0 : Cassara Nida [name] MD (06/22/2006)  ADDERALL (10MG )  TAB (AMPHETAMINE-DEXTROAMPHETAMINE) 1 by mouth every afternoon uad  #30 x 0 :  Kindra Bickham [name] MD (06/22/2006)  ADDERALL XR 15 MG CP24 (AMPHETAMINE-DEXTROAMPHETAMINE) 3 tablets by mouth daily  #90 x 0 : Atsushi Yom K Onyx Edgley MD (06/22/2006)  SINGULAIR 10 MG TAB (MONTELUKAST SODIUM) 1 po daily  #30 x 5 : Carron Jaggi K Leatta Alewine MD (06/22/2006)  ADDERALL XR 15 MG CP24 (AMPHETAMINE-DEXTROAMPHETAMINE) 3 tablets by mouth daily  #90 x 0 : Leanor Voris K Marcelia Petersen MD (06/22/2006)  WELLBUTRIN XL 150 MG TB24 (BUPROPION HCL) one po qd  #30 x 5 : Maeleigh Buschman K Tayvion Lauder MD (06/22/2006)  ADVAIR DISKUS 500-50 MCG/DOSE MISC (FLUTICASONE-SALMETEROL) 1 puff bid  #1 diskus x 5 : Eliel Dudding K Jalaysia Lobb MD (06/22/2006)  ALBUTEROL SULFATE NEBU (ALBUTEROL SULFATE  NEBU) 1-2 puffs prn  #30 x 5 : Sylvanna Burggraf K Nialah Saravia MD (06/22/2006)  ADDERALL (10MG )  TAB (AMPHETAMINE-DEXTROAMPHETAMINE) 1 by mouth every afternoon uad  #30 x 0 : Bon Dowis K Alexee Delsanto MD (06/22/2006)  LEXAPRO 20 MG TABS (ESCITALOPRAM OXALATE) 1 tab po qd  #30 x 5 : Olanda Downie K Salena Ortlieb MD (06/22/2006)  NASONEX 50 MCG/ACT SUSPN (MOMETASONE FUROATE) one spray each nostril bid  #1 bottle x 2 : Vernette Moise [name] MD (06/22/2006)    New medications:  NASONEX 50 MCG/ACT SUSPN -- one spray each nostril bid  Start date: 06/22/2006    Assessment and Plan Comments   250-302    1. 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 depn/anx: continue w/ current treatment   3. ADD: continue w/ current treatment     Today's Orders   99214 - Ofc Vst, Est Level IV [[id number]]    Disposition:   Return to clinic for Doctor Visit in 3 month(s)             Prescriptions:  ADDERALL XR 15 MG CP24 (AMPHETAMINE-DEXTROAMPHETAMINE) 3 tablets by mouth daily  #90 x 0   Entered and Authorized by: Kian Ottaviano [name] MD   Signed by: Cleone Slim MD on 06/22/2006   Method used: Print then Give to Patient  ADDERALL (10MG )  TAB (AMPHETAMINE-DEXTROAMPHETAMINE) 1 by mouth every afternoon uad  #30 x 0   Entered and Authorized by: Angelina Neece [name] MD   Signed by: Cleone Slim MD on 06/22/2006   Method used: Print then Give to Patient  ADDERALL (10MG )  TAB (AMPHETAMINE-DEXTROAMPHETAMINE) 1 by mouth every afternoon uad  #30 x 0   Entered and Authorized by: Bradrick Kamau [name] MD   Signed by: Cleone Slim MD on 06/22/2006   Method used: Print then Give to Patient  ADDERALL XR 15 MG CP24 (AMPHETAMINE-DEXTROAMPHETAMINE) 3 tablets by mouth daily  #90 x 0   Entered and Authorized by: Willistine Ferrall [name] MD   Signed by: Cleone Slim MD on 06/22/2006   Method used: Print then Give to Patient  SINGULAIR 10 MG TAB (MONTELUKAST SODIUM) 1 po daily  #30 x 5   Entered and Authorized by: Mitchael Luckey [name] MD   Signed by: Cleone Slim MD on  06/22/2006   Method used: Print then Give to Patient  ADDERALL XR 15 MG CP24 (AMPHETAMINE-DEXTROAMPHETAMINE) 3 tablets by mouth daily  #90 x 0   Entered and Authorized by: Kalix Meinecke [name] MD   Signed by: Cleone Slim MD on 06/22/2006   Method used: Print then Give to Patient  WELLBUTRIN XL 150 MG TB24 (BUPROPION HCL) one po qd  #30 x 5   Entered and Authorized by: Tameem Pullara K Thedore Mins  MD   Signed by: Cleone Slim MD on 06/22/2006   Method used: Print then Give to Patient  ADVAIR DISKUS 500-50 MCG/DOSE MISC (FLUTICASONE-SALMETEROL) 1 puff bid  #1 diskus x 5   Entered and Authorized by: Laren Orama [name] MD   Signed by: Cleone Slim MD on 06/22/2006   Method used: Print then Give to Patient  ALBUTEROL SULFATE NEBU (ALBUTEROL SULFATE NEBU) 1-2 puffs prn  #30 x 5   Entered and Authorized by: Andreana Klingerman [name] MD   Signed by: Cleone Slim MD on 06/22/2006   Method used: Print then Give to Patient  ADDERALL (10MG )  TAB (AMPHETAMINE-DEXTROAMPHETAMINE) 1 by mouth every afternoon uad  #30 x 0   Entered and Authorized by: Isadore Bokhari [name] MD   Signed by: Cleone Slim MD on 06/22/2006   Method used: Print then Give to Patient  LEXAPRO 20 MG TABS (ESCITALOPRAM OXALATE) 1 tab po qd  #30 x 5   Entered and Authorized by: Catori Panozzo [name] MD   Signed by: Cleone Slim MD on 06/22/2006   Method used: Print then Give to Patient  NASONEX 50 MCG/ACT SUSPN (MOMETASONE FUROATE) one spray each nostril bid  #1 bottle x 2   Entered and Authorized by: Jemma Rasp [name] MD   Signed by: Cleone Slim MD on 06/22/2006   Method used: Print then Give to Patient                ]

## 2006-06-24 NOTE — Unmapped (Signed)
Signed by Danton Sewer MA on 06/24/2006 at 13:21:52    Phone Note   Other Incoming Call    Call From: [facility]  Caller Phone #: [phone]  Call For: [facility]   Summary of call: WOULD LIKE A CLARIFICATION REGARDING THE ALBUTEROL.  IS IT FOR THE INHALER OR NEBULIZER?  Initial call taken by: Elberta Fortis,  June 24, 2006 11:27 AM      New Medications:  ALBUTEROL SULFATE HFA 108 MCG/ACT AERS (ALBUTEROL SULFATE) two puffs every four hours as needed    Follow-up for Phone Call   please advise  provider notified  Follow-up by: Danton Sewer MA,  June 24, 2006 11:36 AM    Additional Follow-up for Phone Call   sorry: its for the hfa inhaler (its now changed in the emr)  Additional Follow-up by: Cleone Slim MD,  June 24, 2006 12:12 PM    Additional Follow-up for Phone Call   rtnd call to pharmacy to clarify what med  phone call completed  Additional Follow-up by: Danton Sewer MA,  June 24, 2006 1:21 PM

## 2006-07-12 NOTE — Unmapped (Signed)
Signed by Peggye Fothergill MD on 07/14/2006 at 13:40:24      Reason for Visit   Chief Complaint: left hip pain x 4days 8 on pain level scale/ pain moving into right hip as well pain level in right 6    History from: patient    Allergies  No Known Allergies    Medications   LEXAPRO 20 MG TABS (ESCITALOPRAM OXALATE) 1 tab po qd  ADDERALL (10MG )  TAB (AMPHETAMINE-DEXTROAMPHETAMINE) 1 by mouth every afternoon uad  ALBUTEROL SULFATE HFA 108 MCG/ACT AERS (ALBUTEROL SULFATE) two puffs every four hours as needed  ADVAIR DISKUS 500-50 MCG/DOSE MISC (FLUTICASONE-SALMETEROL) 1 puff bid  CLARITIN 10 MG TABS (LORATADINE) 1 tab po qd  WELLBUTRIN XL 150 MG TB24 (BUPROPION HCL) one po qd  ADDERALL XR 15 MG CP24 (AMPHETAMINE-DEXTROAMPHETAMINE) 3 tablets by mouth daily  SINGULAIR 10 MG TAB (MONTELUKAST SODIUM) 1 po daily  NASONEX 50 MCG/ACT SUSPN (MOMETASONE FUROATE) one spray each nostril bid        Vital Signs:   Wt: 128 lbs.      Wt chg (lbs): 1  Temperature: 98.0  degrees F  oral  Pulse: 71 (regular)  BP: 118/78    Pulse Oximetry:   O2 Saturation: 99 %       Joint Pain #1   Chief Complaint: L hip pain  History from: patient  Duration: 4 day(s)    Location:   left hip(s)     Severity:   Current Pain: 6  Typical/Average Pain: 8  Pain Quality/Pattern: throbbing, sharp/stabbing    Modifying Factors:     Aggravating Factors:   Aggravates: bending, heat, job activities, lifting, lying, sitting, standing, walking  Does not aggravate: cold, cough/sneeze, eating    Alleviating Factors:   Alleviates: none  Other: pt took tylenol with n ot much relief.Pain better today than yesterdAY    Past History  Past Medical History (reviewed - no changes required):  Environmental Allergies, Asthma, cervical dysplasia, Anxiety, Depression, ADD  Surgical History (reviewed - no changes required):  conization 1991    Family History (reviewed - no changes required): father: htn  mother: cancer (?uterine)  siblings: A&W  children: asthma/ allergy  Social History  (reviewed - no changes required): Marital Status: married,   Employment Status: employed part-time,   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  Respiratory: Respiration un-labored.  Lung fields clear to auscultation.  No wheezing, rales, rhonchi or pleural rub.  Cardiac: S1 and S2 normal.  RRR without murmurs, rubs, gallops.  No JVD.  Left Lower Extremity: tender on palpation of greater trochanter and L ing ligament,pain on abduction of l hip        New Medications:  NAPROSYN 500 MG TABS (NAPROXEN) 1 by mouth BID  FLEXERIL 10 MG TABS (CYCLOBENZAPRINE HCL) 1 by mouth three times a day PRN  VICODIN 5-500 MG TABS (HYDROCODONE-ACETAMINOPHEN) 1 by mouth three times a day pRN        Medications   New Prescriptions/Refills:  VICODIN 5-500 MG TABS (HYDROCODONE-ACETAMINOPHEN) 1 by mouth three times a day pRN  #30 x 0 : [place] MD (07/12/2006)  FLEXERIL 10 MG TABS (CYCLOBENZAPRINE HCL) 1 by mouth three times a day PRN  #45 x 0 :  Peggye Fothergill MD (07/12/2006)  NAPROSYN 500 MG TABS (NAPROXEN) 1 by mouth BID  #30 x 0 : Peggye Fothergill MD (07/12/2006)    New medications:  NAPROSYN 500 MG TABS -- 1 by mouth BID  Start date: 07/12/2006  FLEXERIL 10 MG TABS -- 1 by mouth three times a day PRN  Start date: 07/12/2006  VICODIN 5-500 MG TABS -- 1 by mouth three times a day pRN  Start date: 07/12/2006    Assessment and Plan Comments   l hip pain strain vs bursitis  supportive maagement  meds  F/u in 2-4 wks or earlier if worsening    Today's Orders   99213 - Ofc Vst, Est Level III [[id number]]            Prescriptions:  VICODIN 5-500 MG TABS (HYDROCODONE-ACETAMINOPHEN) 1 by mouth three times a day pRN  #30 x 0   Entered and Authorized by: Peggye Fothergill MD   Signed by: Peggye Fothergill MD on 07/12/2006   Method used: Print then Give to  Patient  FLEXERIL 10 MG TABS (CYCLOBENZAPRINE HCL) 1 by mouth three times a day PRN  #45 x 0   Entered and Authorized by: Peggye Fothergill MD   Signed by: Peggye Fothergill MD on 07/12/2006   Method used: Print then Give to Patient  NAPROSYN 500 MG TABS (NAPROXEN) 1 by mouth BID  #30 x 0   Entered and Authorized by: Peggye Fothergill MD   Signed by: Peggye Fothergill MD on 07/12/2006   Method used: Print then Give to Patient

## 2006-07-18 LAB — CBC
Hematocrit: 36 % (ref 35.0–45.0)
Hemoglobin: 12 g/dL (ref 11.7–15.5)
MCH: 29.9 pg (ref 27.0–33.0)
MCHC: 33.5 g/dL (ref 32.0–36.0)
MCV: 89.3 fL (ref 80.0–100.0)
Platelets: 312 10*3/uL (ref 140–400)
RBC: 4.03 10*6/uL (ref 3.80–5.10)
RDW: 12.8 % (ref 11.0–15.0)
WBC: 4.7 10*3/uL (ref 3.8–10.8)

## 2006-07-18 LAB — RHEUMATOID FACTOR: Rheumatoid Factor: 4 intl units/mL (ref <14)

## 2006-07-18 LAB — SED RATE: Sed Rate: 22 mm/h — ABNORMAL HIGH (ref <=20)

## 2006-07-18 NOTE — Unmapped (Signed)
Signed by Cleone Slim MD on 07/18/2006 at 12:30:25      Reason for Visit   Chief Complaint: hip pain    History from: patient    Allergies  No Known Allergies    Medications   LEXAPRO 20 MG TABS (ESCITALOPRAM OXALATE) 1 tab po qd  ADDERALL (10MG )  TAB (AMPHETAMINE-DEXTROAMPHETAMINE) 1 by mouth every afternoon uad  ALBUTEROL SULFATE HFA 108 MCG/ACT AERS (ALBUTEROL SULFATE) two puffs every four hours as needed  ADVAIR DISKUS 500-50 MCG/DOSE MISC (FLUTICASONE-SALMETEROL) 1 puff bid  CLARITIN 10 MG TABS (LORATADINE) 1 tab po qd  WELLBUTRIN XL 150 MG TB24 (BUPROPION HCL) one po qd  ADDERALL XR 15 MG CP24 (AMPHETAMINE-DEXTROAMPHETAMINE) 3 tablets by mouth daily  SINGULAIR 10 MG TAB (MONTELUKAST SODIUM) 1 po daily  NASONEX 50 MCG/ACT SUSPN (MOMETASONE FUROATE) one spray each nostril bid  NAPROSYN 500 MG TABS (NAPROXEN) 1 by mouth BID  FLEXERIL 10 MG TABS (CYCLOBENZAPRINE HCL) 1 by mouth three times a day PRN  VICODIN 5-500 MG TABS (HYDROCODONE-ACETAMINOPHEN) 1 by mouth three times a day pRN                Joint Pain #1   Chief Complaint: hip pain    Location:   left hip(s)     Severity:   Current Pain: 1-3  Comment: intermittent severe; constant aching from thigh to knee  last severe episode 3 days ago, lasted 1 h, severe: unable to walk etc; relieved p vicodin  no swleeling or eryhtema  does have some discomfort in other joints, not as severe    Alleviating Factors:   Other: vicodin: as above;     Past History  Past Medical History (reviewed - no changes required):  Environmental Allergies, Asthma, cervical dysplasia, Anxiety, Depression, ADD  Family History (reviewed - no changes required): father: htn  mother: cancer (?uterine)  siblings: A&W  children: asthma/ allergy  remote: bone cancer  Social History (reviewed - no changes required): Marital Status: married,   Employment Status: employed part-time,   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 sleep is not refreshing  Respiratory: compliant with meds, uses albuterol daily      Physical Exam- Detail:   General Appearance: well-developed, well-nourished and in no acute distress.  Left Lower Extremity: hip: FROM, normal strength; tender over greater troch bursa      Comments: xray left hip 1 yr ago normal    New Problems:  PAIN IN JOINT, MULTIPLE SITES (ICD-719.49)      Assessment and Plan  New Problems:  Dx of PAIN IN JOINT, MULTIPLE SITES (ICD-719.49)  Onset: 07/18/2006    Medications   New Prescriptions/Refills:  VICODIN 5-500 MG TABS (HYDROCODONE-ACETAMINOPHEN) 1 by mouth three times a day pRN  #30 x 0 : Mary Alas [name] MD (07/18/2006)    Assessment and Plan Comments     Troch bursisits:Toradol 30mg  im x 1 ,continue w/ current treatment, consider PT +/- ortho referral    Today's Orders   ANA     (ANA) (249) [CPT-86038]  Sedimentation Rate   (ESR) (809) [CPT-85651]  Rheumatoid Factor, qual   (RF) (4418) [CPT-86431]  CBC without Diff   (CBC) (1759) [CPT-85027]  Orthopedic Consult [NUU-72536]  Physical Therapy Evaluation [CPT-97001]  99213 - Ofc Vst, Est Level III [CPT-99213]  Ketorolac (Toradol) per 15mg  [CPT-J1885]    Disposition:   prn  Prescriptions:  VICODIN 5-500 MG TABS (HYDROCODONE-ACETAMINOPHEN) 1 by mouth three times a day pRN  #30 x 0   Entered and Authorized by: Mary Dyches [name] MD   Signed by: Cleone Slim MD on 07/18/2006   Method used: Print then Give to Patient

## 2006-07-18 NOTE — Unmapped (Signed)
Signed by Cleone Slim MD on 07/18/2006 at 20:44:55  Patient: Mary [name]  Note: All result statuses are Final unless otherwise noted.    Tests: (1) CBC (H/H, RBC, INDICES, WBC, PLT) (QDL-1759)   WHITE BLOOD CELL COUNT                              4.7 Thousand/uL             3.8-10.8    RED BLOOD CELL COUNT      4.03 Million/uL             3.80-5.10    HEMOGLOBIN                12.0 g/dL                   56.4-33.2    HEMATOCRIT                36.0 %                      35.0-45.0    MCV                       89.3 fL                     80.0-100.0    MCH                       29.9 pg                     27.0-33.0    MCHC                      33.5 g/dL                   95.1-88.4    RDW                       12.8 %                      11.0-15.0    PLATELET COUNT            312 Thousand/uL             140-400    Note: An exclamation mark (!) indicates a result that was not dispersed into   the flowsheet.  Document Creation Date: 07/18/2006 8:05 PM  _______________________________________________________________________    (1) Order result status: Final  Collection or observation date-time: 07/18/2006 12:38  Requested date-time:   Receipt date-time: 07/18/2006 18:32  Reported date-time: 07/18/2006 20:00  Referring Physician:    Ordering Physician: Melissa Montane [facility])  Specimen Source: B  Source: [name] Order Number: [id number]  Lab site: [facility] [place]      [address]      [name]  [place]  [id number]      -----------------    The following lab values were dispersed to the flowsheet  with no units conversion:      WHITE BLOOD CELL COUNT, 4.7 THOUSAND/UL, (F)  expected units: 10*3/mm3    RED BLOOD CELL COUNT, 4.03 MILLION/UL, (F)  expected units: 10*6/mm3    PLATELET COUNT, 312 THOUSAND/UL, (F)  expected units: 10*3/mm3

## 2006-07-18 NOTE — Unmapped (Signed)
Signed by Cleone Slim MD on 07/20/2006 at 08:39:22  Patient: Mary [name]  Note: All result statuses are Final unless otherwise noted.    Tests: (1) RHEUMATOID FACTOR (QDL-4418)    RHEUMATOID FACTOR         4 IU/mL                     <14    Note: An exclamation mark (!) indicates a result that was not dispersed into   the flowsheet.  Document Creation Date: 07/19/2006 3:57 PM  _______________________________________________________________________    (1) Order result status: Final  Collection or observation date-time: 07/18/2006 12:38  Requested date-time:   Receipt date-time: 07/18/2006 18:32  Reported date-time: 07/19/2006 15:00  Referring Physician:    Ordering Physician: Melissa Montane [facility])  Specimen Source: S  Source: [name] Order Number: [id number]  Lab site: [name] [place]      [address]      [place]  [place]  [address]

## 2006-07-18 NOTE — Unmapped (Signed)
Signed by Cleone Slim MD on 07/19/2006 at 11:16:37  Patient: Mary [name]  Note: All result statuses are Final unless otherwise noted.    Tests: (1) ANACHOICE(TM) SCREEN W/REFL TO TITER, IFA (QDL-249)  ! ANACHOICE(TM) SCREEN      NEGATIVE                    NEGATIVE    Note: An exclamation mark (!) indicates a result that was not dispersed into   the flowsheet.  Document Creation Date: 07/19/2006 11:10 AM  _______________________________________________________________________    (1) Order result status: Final  Collection or observation date-time: 07/18/2006 12:38  Requested date-time:   Receipt date-time: 07/18/2006 18:32  Reported date-time: 07/19/2006 10:00  Referring Physician:    Ordering Physician: Melissa Montane [facility])  Specimen Source: S  Source: [name] Order Number: [id number]  Lab site: [name] [place]      [address]      [place]  [place]  [address]

## 2006-07-18 NOTE — Unmapped (Signed)
Signed by Cleone Slim MD on 07/19/2006 at 08:47:15  Patient: Mary [name]  Note: All result statuses are Final unless otherwise noted.    Tests: (1) SED RATE BY MODIFIED WESTERGREN (QDL-809)   SED RATE BY MODIFIED WESTERGREN                         [H]  22 mm/h                     < OR = 20    Note: An exclamation mark (!) indicates a result that was not dispersed into   the flowsheet.  Document Creation Date: 07/19/2006 2:09 AM  _______________________________________________________________________    (1) Order result status: Final  Collection or observation date-time: 07/18/2006 12:38  Requested date-time:   Receipt date-time: 07/18/2006 18:32  Reported date-time: 07/19/2006 01:00  Referring Physician:    Ordering Physician: Melissa Montane [facility])  Specimen Source: B  Source: [name] Order Number: [id number]  Lab site: [name] [place]      [address]      [place]  [place]  [address]

## 2006-07-20 NOTE — Unmapped (Addendum)
Signed by Cleone Slim MD on 07/20/2006 at 08:41:22    [facility]  [address]  [address]  [place], [place] [address]  Ph: [phone]  Fax: [phone]     July 20, 2006      [facility]  [address] [place], [place] [address]                                                                                           RE:  TEST RESULTS   FAIRES Mary [name]--1971/09/27)         Dear Ms. Mary [name]:      The following is an interpretation of your most recent tests.  Please take note of any instructions provided.  CBC results (Blood Counts):  Normal      Other lab results: Tests for lupus and rheumatoid factor were normal. The test for inflammation was slightly high (normal <20, yours was 22).     Additional Comments:   If the hip is not improving you may want to proceed with either the therapy or orthopedic referral.       Hope you're feeling better,        Cleone Slim MD        Signed by Danton Sewer MA on 07/27/2006 at 17:20:07    lab letter 07/27/2006  wk

## 2006-07-27 NOTE — Unmapped (Signed)
Signed by Lynetta Mare MA on 07/27/2006 at 10:34:30    Phone Note   Patient Call  Call back at Home Phone: [phone]  Caller's Cell Phone #: [phone]  Caller: patient  Department: Family Medicine  Call for: [facility]    Summary of Call: LAB RESULTS FROM LAST WEEK     Initial call taken by: Verlene Mayer,  July 27, 2006 8:23 AM      Follow-up for Phone Call   spoke with patient  phone call completed  Follow-up by: Fulton Mole MA,  July 27, 2006 10:34 AM

## 2006-10-06 NOTE — Unmapped (Signed)
Signed by Danton Sewer MA on 10/07/2006 at 09:55:16    Phone Note   Patient Call  Caller's Cell Phone #: [phone]  Caller: patient  Department: Family Medicine  Call for: [facility]     Summary of Call: PT WOULD LIKE A RX FOR ADDERALL XR ONCE DAILY AND ADDERALL 10 MG AS NEEDED.      WOULD LIKE TO KNOW IF SHE CAN PICK UP TOMORROW.  CAN BE REACHED ON CELL     Initial call taken by: Elberta Fortis,  October 06, 2006 4:11 PM      Follow-up for Phone Call   provider notified  Follow-up by: Danton Sewer MA,  October 06, 2006 5:16 PM    Additional Follow-up for Phone Call   last seen in oct  Please  make a follow up appointment. : i can see tomorrow  pls don't call for refills of this medicine : it is a restricted med & the medical board frowns on phone in refills...when its time for a refill its time to be seen   Additional Follow-up by: Cleone Slim MD,  October 06, 2006 5:21 PM    Additional Follow-up for Phone Call   left mssg needs follow up per dr Thedore Mins  phone call completed, left message for patient  Additional Follow-up by: Danton Sewer MA,  October 07, 2006 9:54 AM

## 2006-10-11 NOTE — Unmapped (Signed)
Signed by Cleone Slim MD on 10/11/2006 at 15:03:56      Reason for Visit   Chief Complaint: FU ADD    History from: patient    Allergies  No Known Allergies    Medications   .med  Current Meds:   LEXAPRO 20 MG TABS (ESCITALOPRAM OXALATE) 1 tab po qd  ADDERALL (10MG )  TAB (AMPHETAMINE-DEXTROAMPHETAMINE) 1 by mouth every afternoon uad  ALBUTEROL SULFATE HFA 108 MCG/ACT AERS (ALBUTEROL SULFATE) two puffs every four hours as needed  ADVAIR DISKUS 500-50 MCG/DOSE MISC (FLUTICASONE-SALMETEROL) 1 puff bid  CLARITIN 10 MG TABS (LORATADINE) 1 tab po qd  WELLBUTRIN XL 150 MG TB24 (BUPROPION HCL) one po qd  ADDERALL XR 15 MG CP24 (AMPHETAMINE-DEXTROAMPHETAMINE) 3 tablets by mouth daily  SINGULAIR 10 MG TAB (MONTELUKAST SODIUM) 1 po daily  NASONEX 50 MCG/ACT SUSPN (MOMETASONE FUROATE) one spray each nostril bid       Vital Signs:   Wt: 128 lbs.      Wt chg (lbs): 0  Temperature: 98.5  degrees F  oral  Pulse: 93 (regular)  BP: 104/68        HPI Multiple Chronic   Condition(s): Arthritis.  Additional Dx: ADD  Comments: overall doing well, compliant with meds w/o s/e; would like to change lexapro to celexa b/o $$ issues    Current Status:   Compliance Comments:   ran out v meds 1 week ago    Past History  Past Medical History (reviewed - no changes required):  Environmental Allergies, Asthma, cervical dysplasia, Anxiety, Depression, ADD  Social History (reviewed - no changes required): Marital Status: married,   Employment Status: employed part-time,   Occupation: Charity fundraiser @ [employer]  Alcohol Use: occasionally  Tobacco Usage:non-smoker    Review of Systems   Psychiatric: Complains of see HPI. depn well controlled w/ both lexapro & WBXL      Physical Examination:   BP: 104/  68    Physical Exam- Detail:   General Appearance: well-developed, well-nourished and in no acute distress.  Psychiatric: Judgement and insight are within normal limits.  Alert and oriented x3.  No mood disorders noted, appropriate affect.        New  Problems:  SCREENING FOR LIPOID DISORDERS (ICD-V77.91)  New Medications:  CELEXA 40 MG TABS (CITALOPRAM HYDROBROMIDE) one by mouth daily      Preventive Maintenance   Pap Smear Due: adv  Lipid Panel Due: adv       Assessment and Plan  Status of Existing Problems:  Assessed ADD as improved - Ethelmae Ringel [name] MD  Assessed DEPRESSION as improved - Lamiyah Schlotter [name] MD  New Problems:  Dx of SCREENING FOR LIPOID DISORDERS (ICD-V77.91)  Onset: 10/11/2006    Medications   New Prescriptions/Refills:  CELEXA 40 MG TABS (CITALOPRAM HYDROBROMIDE) one by mouth daily  #30 x 5 : Atiyah Bauer K Zoraya Fiorenza MD (10/11/2006)  ADDERALL XR 15 MG CP24 (AMPHETAMINE-DEXTROAMPHETAMINE) 3 tablets by mouth daily  #90 x 0 : Mahreen Schewe [name] MD (10/11/2006)  ADDERALL (10MG )  TAB (AMPHETAMINE-DEXTROAMPHETAMINE) 1 by mouth every afternoon uad  #30 x 0 : Clarisse Rodriges K Jonni Oelkers MD (10/11/2006)  ADDERALL XR 15 MG CP24 (AMPHETAMINE-DEXTROAMPHETAMINE) 3 tablets by mouth daily  #90 x 0 : Cailyn Houdek [name] MD (10/11/2006)  ADDERALL (10MG )  TAB (AMPHETAMINE-DEXTROAMPHETAMINE) 1 by mouth every afternoon uad  #30 x 0 : Elena Cothern K Khala Tarte MD (10/11/2006)  ADDERALL XR 15 MG CP24 (AMPHETAMINE-DEXTROAMPHETAMINE) 3 tablets by mouth daily  #90 x 0 :  Brandon Scarbrough [name] MD (10/11/2006)  WELLBUTRIN XL 150 MG TB24 (BUPROPION HCL) one po qd  #30 x 5 : Sopheap Boehle K Tharon Bomar MD (10/11/2006)  ADDERALL (10MG )  TAB (AMPHETAMINE-DEXTROAMPHETAMINE) 1 by mouth every afternoon uad  #30 x 0 : Calena Salem K Torunn Chancellor MD (10/11/2006)  LEXAPRO 20 MG TABS (ESCITALOPRAM OXALATE) 1 tab po qd  #30 x 5 : Zameria Vogl [name] MD (10/11/2006)    New medications:  CELEXA 40 MG TABS -- one by mouth daily  Start date: 10/11/2006    Assessment and Plan Comments   [id number]    1. ADD: continue w/ current treatment   2. Depn: trial w/ change lexapro to celexa    Today's Orders   Lipid Profile   (FATS) (7600) [CPT-80061]  99213 - Ofc Vst, Est Level III [LFY-10175]    Disposition:   Return to clinic for Doctor Visit in 6 month(s)             Prescriptions:  CELEXA  40 MG TABS (CITALOPRAM HYDROBROMIDE) one by mouth daily  #30 x 5   Entered and Authorized by: Elster Corbello [name] MD   Signed by: Cleone Slim MD on 10/11/2006   Method used: Print then Give to Patient  ADDERALL XR 15 MG CP24 (AMPHETAMINE-DEXTROAMPHETAMINE) 3 tablets by mouth daily  #90 x 0   Entered and Authorized by: Torey Regan [name] MD   Signed by: Cleone Slim MD on 10/11/2006   Method used: Print then Give to Patient  ADDERALL (10MG )  TAB (AMPHETAMINE-DEXTROAMPHETAMINE) 1 by mouth every afternoon uad  #30 x 0   Entered and Authorized by: Minas Bonser [name] MD   Signed by: Cleone Slim MD on 10/11/2006   Method used: Print then Give to Patient  ADDERALL XR 15 MG CP24 (AMPHETAMINE-DEXTROAMPHETAMINE) 3 tablets by mouth daily  #90 x 0   Entered and Authorized by: Elia Nunley [name] MD   Signed by: Cleone Slim MD on 10/11/2006   Method used: Print then Give to Patient  ADDERALL (10MG )  TAB (AMPHETAMINE-DEXTROAMPHETAMINE) 1 by mouth every afternoon uad  #30 x 0   Entered and Authorized by: Madelyn Tlatelpa [name] MD   Signed by: Cleone Slim MD on 10/11/2006   Method used: Print then Give to Patient  ADDERALL XR 15 MG CP24 (AMPHETAMINE-DEXTROAMPHETAMINE) 3 tablets by mouth daily  #90 x 0   Entered and Authorized by: Geana Walts [name] MD   Signed by: Cleone Slim MD on 10/11/2006   Method used: Print then Give to Patient  WELLBUTRIN XL 150 MG TB24 (BUPROPION HCL) one po qd  #30 x 5   Entered and Authorized by: Emin Foree [name] MD   Signed by: Cleone Slim MD on 10/11/2006   Method used: Print then Give to Patient  ADDERALL (10MG )  TAB (AMPHETAMINE-DEXTROAMPHETAMINE) 1 by mouth every afternoon uad  #30 x 0   Entered and Authorized by: Scotland Dost [name] MD   Signed by: Cleone Slim MD on 10/11/2006   Method used: Print then Give to Patient  LEXAPRO 20 MG TABS (ESCITALOPRAM OXALATE) 1 tab po qd  #30 x 5   Entered and Authorized by: Kavish Lafitte [name] MD   Signed by: Cleone Slim MD on 10/11/2006   Method used: Print then Give to Patient                ]

## 2006-10-13 NOTE — Unmapped (Signed)
Signed by Danton Sewer MA on 10/14/2006 at 16:34:04    Phone Note   Patient Call  Call back at Home Phone: [phone]  Caller: patient  Department: Family Medicine  Call for: [facility]     Reason for Call: speak with nurse  Summary of Call: PT IS HAVING PROBLEMS WITH FILLING ADDERALL.  NEEDS A PRIOR AUTHORIZATION.  [facility] [phone]  HER INSURANCE IS [employer] [id number] MEMBER ID [id number]    PT HAS BEEN WITH OUT THE MEDICATION FOR SEVERAL DAYS AND IS HAVING SEVERE HEADACHES AND FATIQUE.       Initial call taken by: Elberta Fortis,  October 13, 2006 2:13 PM      Follow-up for Phone Call   PRIOR AUTH NOW REQUIRED DUE TO CHANGES IN [facility] PLANS PER INSURANCE COMPANY  REFERENCE NUMBER FOR 10MG  IS [id number] -[id number]  PRIOR AUTH NUMBER [id number]  Follow-up by: Danton Sewer MA,  October 13, 2006 2:45 PM    Additional Follow-up for Phone Call   PT CALLED TO CHECK STATUS OF THE PA.  Additional Follow-up by: Elberta Fortis,  October 14, 2006 12:57 PM    Additional Follow-up for Phone Call   This caller has called for the 3rd time regarding this same issue. I've changed the priority of this call to URGENT and the patient is requesting a call back as soon as possible.   please call today  Additional Follow-up by: Verlene Mayer,  October 14, 2006 3:48 PM    Additional Follow-up for Phone Call   recvd pa today//called pharmacy-faxed to pharmacy  phone call completed, Rx completed  Additional Follow-up by: Danton Sewer MA,  October 14, 2006 4:34 PM

## 2006-10-13 NOTE — Unmapped (Signed)
Signed by Peggye Fothergill MD on 10/13/2006 at 00:00:00  Medication Prior Authorization      Imported By: Kerrie Pleasure MA 11/08/2006 10:09:35    _____________________________________________________________________    External Attachment:    Please see [employer] EMR for this document.

## 2006-10-13 NOTE — Unmapped (Signed)
Signed by Peggye Fothergill MD on 10/13/2006 at 00:00:00  Medication Prior Authorization      Imported By: Kerrie Pleasure MA 11/08/2006 10:09:43    _____________________________________________________________________    External Attachment:    Please see [employer] EMR for this document.

## 2006-10-14 NOTE — Unmapped (Signed)
Signed by Peggye Fothergill MD on 10/14/2006 at 00:00:00  Insurance Correspondence      Imported By: Kerrie Pleasure MA 11/09/2006 16:47:10    _____________________________________________________________________    External Attachment:    Please see [employer] EMR for this document.

## 2006-10-14 NOTE — Unmapped (Signed)
Signed by Peggye Fothergill MD on 10/14/2006 at 00:00:00  Insurance Correspondence      Imported By: Kerrie Pleasure MA 11/10/2006 16:02:36    _____________________________________________________________________    External Attachment:    Please see [employer] EMR for this document.

## 2006-10-14 NOTE — Unmapped (Signed)
Signed by Peggye Fothergill MD on 10/14/2006 at 00:00:00  Medication Prior Authorization      Imported By: Kerrie Pleasure MA 11/09/2006 16:45:27    _____________________________________________________________________    External Attachment:    Please see [employer] E[employer] for this document.

## 2006-10-14 NOTE — Unmapped (Signed)
Signed by Peggye Fothergill MD on 10/14/2006 at 00:00:00  Insurance Correspondence      Imported By: Kerrie Pleasure MA 11/09/2006 16:47:15    _____________________________________________________________________    External Attachment:    Please see [employer] EMR for this document.

## 2006-11-18 NOTE — Unmapped (Signed)
Signed by Danton Sewer MA on 11/18/2006 at 09:17:36    Phone Note   Patient Call  Call back at Home Phone: [phone]  Caller: patient  Department: Family Medicine  Call for: singh    Summary of Call: flu like symptoms x1wk  appt today.....................     Initial call taken by: Verlene Mayer,  November 18, 2006 8:36 AM      Follow-up for Phone Call   appointment scheduled today  Follow-up by: Danton Sewer MA,  November 18, 2006 9:17 AM

## 2006-11-18 NOTE — Unmapped (Signed)
Signed by Peggye Fothergill MD on 11/18/2006 at 18:40:55      Reason for Visit   Chief Complaint: cough cold,fever, x 4 days chest congestion,headache    History from: patient    Allergies  No Known Allergies    Medications   ADDERALL (10MG )  TAB (AMPHETAMINE-DEXTROAMPHETAMINE) 1 by mouth every afternoon uad  ALBUTEROL SULFATE HFA 108 MCG/ACT AERS (ALBUTEROL SULFATE) two puffs every four hours as needed  ADVAIR DISKUS 500-50 MCG/DOSE MISC (FLUTICASONE-SALMETEROL) 1 puff bid  ADDERALL XR 15 MG CP24 (AMPHETAMINE-DEXTROAMPHETAMINE) 3 tablets by mouth daily  SINGULAIR 10 MG TAB (MONTELUKAST SODIUM) 1 po daily        Vital Signs:   Wt: 133 lbs.      Wt chg (lbs): 5  Temperature: 99.0  degrees F  oral  Pulse: 107 (regular)  BP: 100/68    Pulse Oximetry:   O2 Saturation: 97 %           URI/LRI Symptoms:   Chief complaint: cold  Onset: 2-4 days ago  Duration: 3-4Quality- cough: productive    Sick contacts: yes    Modifying Factors:   Other: Nyquil and day quil.    Associated Symptoms:     General:   Complains of: fatigue, fever, headache    Eyes:   Denies: excessive tearing, itching, redness    ENT:   Complains of: otalgia, post nasal drip, sore throat  Denies: ear drainage, sinus pain, sneezing    GI/GU:   Complains of: loss of appetite  Denies: abdominal pain, decreased urine output, diarrhea, vomiting    Respiratory:   Complains of: cough  Denies: dyspnea, pleuritic chest pain    Musculoskeletal:   Complains of: myalgia      Past History  Past Medical History (reviewed - no changes required):  Environmental Allergies, Asthma, cervical dysplasia, Anxiety, Depression, ADD  Social History (reviewed - no changes required): Marital Status: married,   Employment Status: employed part-time,   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      Physical Examination:   BP: 100/  68    Physical Exam- Detail:   General Appearance:  well-developed, well-nourished and in no acute distress.  Eyes: Sclera white, conjunctiva without injection and pallor.  PERRLA.  EOMI  Ears: tm nl  Nose/Face: Nares boggy mild sinus trendernes  Oropharynx: pp nl  Respiratory: Respiration un-labored.  Lung fields clear to auscultation.  No wheezing, rales, rhonchi or pleural rub.  Neck: Full ROM.  No thyromegaly, no nodules, masses, or tenderness.   Lymphatic: Areas palpated not enlarged:  cervical, supraclavicular.  Cardiac: S1 and S2 normal.  RRR without murmurs, rubs, gallops.  No JVD.  Vascular: No carotid bruits.  No edema or varicosities.  Abdomen: No masses or tenderness. Bowel sounds active x4 quad.  Liver and spleen are without tenderness or enlargement.  No hernias.  Psychiatric: Judgement and insight are within normal limits.  Alert and oriented x3.  No mood disorders noted, appropriate affect.        New Problems:  UPPER RESPIRATORY INFECTION, VIRAL (ICD-465.9)  New Medications:  PROMETHAZINE-CODEINE 6.25-10 MG/5ML SYRP (PROMETHAZINE-CODEINE) 5ml by mouth every 6hrs prn      Preventive Maintenance   Pap Smear Due: adv  Lipid Panel Due: adv       Assessment and Plan  New Problems:  Dx of UPPER RESPIRATORY INFECTION,  VIRAL (ICD-465.9)  Onset: 11/18/2006    Medications   New Prescriptions/Refills:  PROMETHAZINE-CODEINE 6.25-10 MG/5ML SYRP (PROMETHAZINE-CODEINE) 5ml by mouth every 6hrs prn  #228ml x 0 : [place] MD (11/18/2006)    New medications:  PROMETHAZINE-CODEINE 6.25-10 MG/5ML SYRP -- 5ml by mouth every 6hrs prn  Start date: 11/18/2006    Assessment and Plan Comments   Viral infection  Fluids Advil cold and sinus rest   Alavert and f/u if no better.    Today's Orders   99213 - Ofc Vst, Est Level III [[id number]]            Prescriptions:  PROMETHAZINE-CODEINE 6.25-10 MG/5ML SYRP (PROMETHAZINE-CODEINE) 5ml by mouth every 6hrs prn  #274ml x 0   Entered and Authorized by: Peggye Fothergill MD   Signed by: Peggye Fothergill MD on 11/18/2006   Method used: Print then  Give to Patient

## 2006-11-28 LAB — POCT RAPID STREP A: Rapid Strep A Screen: NEGATIVE

## 2006-11-28 NOTE — Unmapped (Signed)
Signed by Cleone Slim MD on 11/28/2006 at 11:46:45      Reason for Visit   Chief Complaint: still sick seen 2-15 congestion     History from: patient    Allergies  No Known Allergies    Medications   ADDERALL (10MG )  TAB (AMPHETAMINE-DEXTROAMPHETAMINE) 1 by mouth every afternoon uad  ALBUTEROL SULFATE HFA 108 MCG/ACT AERS (ALBUTEROL SULFATE) two puffs every four hours as needed  ADVAIR DISKUS 500-50 MCG/DOSE MISC (FLUTICASONE-SALMETEROL) 1 puff bid  ADDERALL XR 15 MG CP24 (AMPHETAMINE-DEXTROAMPHETAMINE) 3 tablets by mouth daily  SINGULAIR 10 MG TAB (MONTELUKAST SODIUM) 1 po daily        Vital Signs:   Ht: 68 in.  Wt: 136 lbs.      BMI: 20.75  BSA: 1.74  Wt chg (lbs): 3  Temperature: 97.5  degrees F  oral  Pulse: 99 (regular)  Resp: 14  BP: 100/66    Intake recorded by: Kerrie Pleasure MA on November 28, 2006 11:11 AM      History of Present Illness: 1. patient presents c/o fever (101 last nite), congestion and sore throat beginning yesterday. sore throat has been very painful. taking 800 mg Advil for pain. some left ear discomfort. denies body aches, head aches, nausea/vomitting.  patient had flu last week and has residual cough. 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 history of recurrent strep.     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      Physical Examination:   BP: 100/  66    Physical Exam- Detail:   General Appearance: well-developed, well-nourished and in no acute distress.  Ears: tm nl  Nose/Face: no sinus ttp  Oropharynx: erythema pharynx w/o exudate  Lymphatic: Areas palpated not enlarged:  cervical,  supraclavicular.      In office Procedures & Tests     Other tests:   Rapid Strep: negative      New Problems:  PHARYNGITIS, ACUTE (ICD-462)      Preventive Maintenance   Pap Smear Due: adv  Lipid Panel Due: adv       Assessment and Plan  New Problems:  Dx of PHARYNGITIS, ACUTE (ICD-462)  Onset: 11/28/2006  Assessment and Plan Comments     ST: salt water gargle . rest , ibup as needed, call if worsening of symptoms     Today's Orders   99213 - Ofc Vst, Est Level III [CPT-99213]  Strep I.D (Office) [CPT-87880]    Disposition:   as scheduled

## 2006-11-30 NOTE — Unmapped (Addendum)
Signed by Claudette Head MD on 11/30/2006 at 17:16:14    Phone Note   Patient Call  Call back at Home Phone: [phone]  Caller: patient  Department: Family Medicine  Call for: Raymir Frommelt    Reason for Call: speak with nurse  Summary of Call: HAD APPT ON [facility] 11/28/06   WANTS TO DISCUSS THE APPT -  REGARDING SYMPTOMS SHE'S STILL HAVING      Initial call taken by: Anders Simmonds,  November 30, 2006 12:11 PM      New Medications:  PENICILLIN V POTASSIUM 250 MG TAB (PENICILLIN V POTASSIUM) one by mouth four times a day x 10 days    Follow-up for Phone Call   provider notified  Follow-up by: Danton Sewer MA,  November 30, 2006 12:15 PM    Additional Follow-up for Phone Call   CALLED AGAIN  --   Additional Follow-up by: Anders Simmonds,  November 30, 2006 3:39 PM    Additional Follow-up for Phone Call   had flu last wk, improved, h/o strep, then had st just like prior strep, and now on 4th day of painful soree throat, awoakens in middle of the night snoring d/t swelling, pain is awful, worried that it could be strep.  not allergic to medications.  No cx done at visit, just rapid.  Will treat presumptively, consider having husband cultured as he also has sx.  If fails to improve in next 2-3 days, recommended to return for rapid and culture, and monospot, eval for tonsillar hypertrophy, consider prednisone if sig. swelling/obstructive sx.    [facility]  [phone]  Additional Follow-up by: Claudette Head MD,  November 30, 2006 5:11 PM    Prescriptions:  PENICILLIN V POTASSIUM 250 MG TAB (PENICILLIN V POTASSIUM) one by mouth four times a day x 10 days  #40 x none   Entered and Authorized by: Claudette Head MD   Signed by: Claudette Head MD on 11/30/2006   Method used: Telephoned to ...     [facility] [address]     [address]     [place], [place]  [address]     Ph: [phone]     Fax: [phone]      Signed by Danton Sewer MA on 11/30/2006 at 17:23:23    called rx in  11/30/06  wk

## 2007-01-16 NOTE — Unmapped (Signed)
Signed by Danton Sewer MA on 01/16/2007 at 15:06:06    Phone Note   Call from Pharmacy    Pharmacy Name: [facility]  Pharmacy Phone Number: [phone]  Call for: Mary [name]  Summary of call: singulair 10mg  #30  last refill 3.5.08  Initial call taken by: Verlene Mayer,  January 16, 2007 2:43 PM      Follow-up for Phone Call   pharmacist called  Follow-up by: Danton Sewer MA,  January 16, 2007 3:06 PM

## 2007-02-08 NOTE — Unmapped (Signed)
Signed by Richarda Osmond Dragisic on 02/08/2007 at 13:22:31    Phone Note   Patient Call  Caller's Cell Phone #: [phone]  Caller: patient  Department: Family Medicine  Call for: [facility]    Reason for Call: speak with nurse  Summary of Call: HAS QUESTION REGARDING MEDICATION - ADDERALL     Initial call taken by: Anders Simmonds,  Feb 08, 2007 12:06 PM      Follow-up for Phone Call   Patient is currently taking Adderall and she is out.  She has an appointment Friday.  Can you hand write another prescription to get her until then.  I will forward this to Dr August Saucer as well since Dr Thedore Mins is not in until Tomorrow and she is struggling.  [phone]  Follow-up by: Richarda Osmond Dragisic,  Feb 08, 2007 12:30 PM    Additional Follow-up for Phone Call   Dr August Saucer printed and signed the RX.  I called patient and left message to say that they were ready at the desk  left message for patient  Additional Follow-up by: Richarda Osmond Dragisic,  Feb 08, 2007 1:22 PM    Prescriptions:  ADDERALL XR 15 MG CP24 (AMPHETAMINE-DEXTROAMPHETAMINE) 3 tablets by mouth daily  #90 x 0   Entered by: Richarda Osmond Dragisic   Authorized by: Peggye Fothergill MD   Signed by: Richarda Osmond Dragisic on 02/08/2007   Method used: Reprint   RxID: [id number]  ADDERALL (10MG )  TAB (AMPHETAMINE-DEXTROAMPHETAMINE) 1 by mouth every afternoon uad  #30 x 0   Entered by: Richarda Osmond Dragisic   Authorized by: Peggye Fothergill MD   Signed by: Richarda Osmond Dragisic on 02/08/2007   Method used: Reprint   RxID: [id number]  ADDERALL (10MG )  TAB (AMPHETAMINE-DEXTROAMPHETAMINE) 1 by mouth every afternoon uad  #30 x 0   Entered by: Peggye Fothergill MD   Authorized by: Cleone Slim MD   Signed by: Peggye Fothergill MD on 02/08/2007   Method used: Print then Give to Patient   RxID: [id number]  ADDERALL XR 15 MG CP24 (AMPHETAMINE-DEXTROAMPHETAMINE) 3 tablets by mouth daily  #90 x 0   Entered by: Peggye Fothergill MD   Authorized by: Cleone Slim MD   Signed by: Peggye Fothergill MD on 02/08/2007   Method used: Print  then Give to Patient   RxID: [id number]

## 2007-02-10 NOTE — Unmapped (Signed)
Signed by Cleone Slim MD on 02/10/2007 at 12:07:20      Reason for Visit   Chief Complaint: add check up     History from: patient    Allergies  No Known Allergies    Medications   ADDERALL (10MG )  TAB (AMPHETAMINE-DEXTROAMPHETAMINE) 1 by mouth every afternoon uad  ALBUTEROL SULFATE HFA 108 MCG/ACT AERS (ALBUTEROL SULFATE) two puffs every four hours as needed  ADVAIR DISKUS 500-50 MCG/DOSE MISC (FLUTICASONE-SALMETEROL) 1 puff bid  ADDERALL XR 15 MG CP24 (AMPHETAMINE-DEXTROAMPHETAMINE) 3 tablets by mouth daily  SINGULAIR 10 MG TAB (MONTELUKAST SODIUM) 1 po daily    Vital Signs:   Ht: 68 in.  Wt: 132 lbs.      BMI: 20.14  BSA: 1.71  Wt chg (lbs): -4  Pulse: 98 (regular)  Resp: 18  BP: 128/76    Intake recorded by: Kerrie Pleasure MA on Feb 10, 2007 11:32 AM        HPI Multiple Chronic   Condition(s): Asthma.  Additional Dx: ADD, Depn  Comments: peak flows: 300-315)  (goal: 475)    ROS/Symptoms:   Patient denies the following respiratory symptoms: wheezing  Patient complains of the following respiratory symptoms: chest tight ? 2o allergies  Patient complains of the following psychological symptoms: has been off all psych meds x several weeks, but mood & concentration have not been as well recently; wants to resume WBXL, would rather not resume lexapro      Past History  Past Medical History (reviewed - no changes required):  Environmental Allergies, Asthma, cervical dysplasia, Anxiety, Depression, ADD  Social History (reviewed - no changes required): Marital Status: married,   Employment Status: employed part-time,   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      Physical Examination:   BP: 128/  76    Physical Exam- Detail:   General Appearance: well-developed, well-nourished and in no acute distress.  Respiratory: dec a/e, soft wheezes bilat  Psychiatric: Judgement and insight are within normal limits.  Alert and  oriented x3.  No mood disorders noted, appropriate affect.        Follow-up for Test Results:     New Medications:  PREDNISONE 10 MG TABS (PREDNISONE) four by mouth daily x 4 days then 2 by mouth daily x 4 days then Use as directed.      Preventive Maintenance   Pap Smear Due: adv  Lipid Panel Due: adv       Assessment and Plan  Status of Existing Problems:  Assessed ADD as improved - Chalisa Kobler [name] MD  Assessed ASTHMA NOS W/O STATUS ASTHMATICUS as deteriorated - Kerrilynn Derenzo [name] MD    Medications   New Prescriptions/Refills:  ADDERALL XR 15 MG CP24 (AMPHETAMINE-DEXTROAMPHETAMINE) 3 tablets by mouth daily  #90 x 0, 02/10/2007, Bay Wayson [name] MD  ADDERALL (10MG )  TAB (AMPHETAMINE-DEXTROAMPHETAMINE) 1 by mouth every afternoon uad  #30 x 0, 02/10/2007, Kartik Fernando K Marialena Wollen MD  ADDERALL XR 15 MG CP24 (AMPHETAMINE-DEXTROAMPHETAMINE) 3 tablets by mouth daily  #90 x 0, 02/10/2007, Kristopher Attwood K Anjelique Makar MD  ADDERALL (10MG )  TAB (AMPHETAMINE-DEXTROAMPHETAMINE) 1 by mouth every afternoon uad  #30 x 0, 02/10/2007, Vinicius Brockman K Lanyla Costello MD  PREDNISONE 10 MG TABS (PREDNISONE) four by mouth daily x 4 days then 2 by mouth daily x 4 days then Use as directed.  #40 x 0, 02/10/2007, Jancie Kercher  [name] MD    New medications:  PREDNISONE 10 MG TABS -- four by mouth daily x 4 days then 2 by mouth daily x 4 days then Use as directed.  Start date: 02/10/2007    Assessment and Plan Comments   [phone]    1. ADD: continue w/ current treatment   2. Exacn v asthma: add prednisone burst  3. Depn: resume WBXL    Today's Orders   99213 - Ofc Vst, Est Level III [[id number]]    Disposition:   Return to clinic for Doctor Visit in 3 month(s)             Prescriptions:  ADDERALL XR 15 MG CP24 (AMPHETAMINE-DEXTROAMPHETAMINE) 3 tablets by mouth daily  #90 x 0   Entered and Authorized by: Toris Laverdiere [name] MD   Signed by: Cleone Slim MD on 02/10/2007   Method used: Print then Give to Patient   RxID: [id number]  ADDERALL (10MG )  TAB (AMPHETAMINE-DEXTROAMPHETAMINE) 1 by mouth every  afternoon uad  #30 x 0   Entered and Authorized by: Kyla Duffy [name] MD   Signed by: Cleone Slim MD on 02/10/2007   Method used: Print then Give to Patient   RxID: [id number]  ADDERALL XR 15 MG CP24 (AMPHETAMINE-DEXTROAMPHETAMINE) 3 tablets by mouth daily  #90 x 0   Entered and Authorized by: Stepanie Graver [name] MD   Signed by: Cleone Slim MD on 02/10/2007   Method used: Print then Give to Patient   RxID: [id number]  ADDERALL (10MG )  TAB (AMPHETAMINE-DEXTROAMPHETAMINE) 1 by mouth every afternoon uad  #30 x 0   Entered and Authorized by: Jakaleb Payer [name] MD   Signed by: Cleone Slim MD on 02/10/2007   Method used: Print then Give to Patient   RxID: [id number]  PREDNISONE 10 MG TABS (PREDNISONE) four by mouth daily x 4 days then 2 by mouth daily x 4 days then Use as directed.  #40 x 0   Entered and Authorized by: Jakalyn Kratky [name] MD   Signed by: Cleone Slim MD on 02/10/2007   Method used: Print then Give to Patient   RxID: [id number]                ]

## 2007-03-20 NOTE — Unmapped (Signed)
Signed by Richarda Osmond Dragisic on 03/20/2007 at 13:21:26    Phone Note   Patient Call  Call back at Home Phone: [phone]  Caller: patient  Department: Family Medicine  Call for: [facility]    Summary of Call: PT SAID SHE LOST HER ADDERALL SCRIPTS AND ADDERALL XR     Initial call taken by: Earnest Conroy,  March 20, 2007 12:07 PM      Follow-up for Phone Call   forwarded to DR Thedore Mins  Follow-up by: Richarda Osmond Dragisic,  March 20, 2007 12:08 PM    Additional Follow-up for Phone Call   as it is a restricted med we can replace this 1 time, but wont be able to do again in the future  Additional Follow-up by: Cleone Slim MD,  March 20, 2007 12:40 PM    Additional Follow-up for Phone Call   patient called and RX left with log at front desk  Additional Follow-up by: Richarda Osmond Dragisic,  March 20, 2007 1:20 PM    Prescriptions:  ADDERALL XR 15 MG CP24 (AMPHETAMINE-DEXTROAMPHETAMINE) 3 tablets by mouth daily  #90 x 0   Entered and Authorized by: Zayven Powe [name] MD   Signed by: Cleone Slim MD on 03/20/2007   Method used: Print then Give to Patient   RxID: [id number]  ADDERALL (10MG )  TAB (AMPHETAMINE-DEXTROAMPHETAMINE) 1 by mouth every afternoon uad  #30 x 0   Entered and Authorized by: Adreena Willits [name] MD   Signed by: Cleone Slim MD on 03/20/2007   Method used: Print then Give to Patient   RxID: [id number]  ADDERALL XR 15 MG CP24 (AMPHETAMINE-DEXTROAMPHETAMINE) 3 tablets by mouth daily  #90 x 0   Entered and Authorized by: Jiyaan Steinhauser [name] MD   Signed by: Cleone Slim MD on 03/20/2007   Method used: Print then Give to Patient   RxID: [id number]  ADDERALL (10MG )  TAB (AMPHETAMINE-DEXTROAMPHETAMINE) 1 by mouth every afternoon uad  #30 x 0   Entered and Authorized by: Luwanda Starr [name] MD   Signed by: Cleone Slim MD on 03/20/2007   Method used: Print then Give to Patient   RxID: [id number]

## 2007-05-18 NOTE — Unmapped (Signed)
Signed by Cleone Slim MD on 05/19/2007 at 07:30:44    Phone Note   Patient Call  Caller: spouse -    Summary of Call: spouse here to for his own appt  Kyeshia c/w adderall & doing well  unable to make fu appt b/o work & getting kids ready for school  asks for 1 month refill & will fu next month  refill given to husband     Initial call taken by: Cleone Slim MD,  May 19, 2007 7:30 AM      Prescriptions:  ADDERALL (10MG )  TAB (AMPHETAMINE-DEXTROAMPHETAMINE) 1 by mouth every afternoon uad  #30 x 0   Entered and Authorized by: Jestine Bicknell [name] MD   Signed by: Cleone Slim MD on 05/18/2007   Method used: Print then Give to Patient   RxID: [id number]

## 2007-05-23 NOTE — Unmapped (Signed)
Signed by Danton Sewer MA on 05/23/2007 at 11:52:36    Phone Note   Patient Call  Caller's Cell Phone #: [phone]  Caller: patient  Department: Family Medicine  Call for: Thedore Mins     Reason for Call: speak with nurse  Summary of Call: DR. Thedore Mins wrote only one part of the adderall medication.  Pt needs the rx for adderall xr as well.       Initial call taken by: Elberta Fortis,  May 23, 2007 10:40 AM      Follow-up for Phone Call   mssg left-pt needs follow up before she runs out  phone call completed, left message for patient  Follow-up by: Danton Sewer MA,  May 23, 2007 11:52 AM    Prescriptions:  ADDERALL XR 15 MG CP24 (AMPHETAMINE-DEXTROAMPHETAMINE) 3 tablets by mouth daily  #90 x 0   Entered by: Danton Sewer MA   Authorized by: Cleone Slim MD   Signed by: Danton Sewer MA on 05/23/2007   Method used: Print then Give to Patient   RxID: [id number]

## 2007-05-25 NOTE — Unmapped (Signed)
Signed by Danton Sewer MA on 05/25/2007 at 10:39:46    Phone Note   Call from Pharmacy    Pharmacy Name: [facility]  Pharmacy Phone Number: [phone]  Pharmacy Fax Number: [phone]  Call for: [facility]  Summary of call: REFILL: SINGULAIR 10MG .  TAKE 1 daily #30.  LAST FILLED: 04/01/07.  RX# [id number].  Initial call taken by: Blanch Media,  May 25, 2007 9:00 AM      Follow-up for Phone Call   phone call completed, pharmacist called  Follow-up by: Danton Sewer MA,  May 25, 2007 10:39 AM    Prescriptions:  SINGULAIR 10 MG TAB (MONTELUKAST SODIUM) 1 po daily  #30 x 1   Entered by: Danton Sewer MA   Authorized by: Cleone Slim MD   Signed by: Danton Sewer MA on 05/25/2007   Method used: Print then Give to Patient   RxID: [id number]

## 2007-05-31 NOTE — Unmapped (Signed)
Signed by Richarda Osmond Dragisic on 06/01/2007 at 20:18:20    Phone Note   Call from Pharmacy    Pharmacy Name: [facility]  Caller: [facility]  Pharmacy Phone Number: [phone]  Call for: Thedore Mins  Summary of call: P.A for Singulair 10mg .   Ins # [id number].  ID# [id number].  Initial call taken by: Blanch Media,  May 31, 2007 2:24 PM      Follow-up for Phone Call   insurance company is faxing a form for dr Isabelle Matt to fill out  Follow-up by: Danton Sewer MA,  May 31, 2007 3:10 PM    Additional Follow-up for Phone Call   FAXED FORMS TO PHARM[facility]Y-[facility] APPROVAL OF SINGULAIR  Additional Follow-up by: Danton Sewer MA,  June 01, 2007 4:41 PM    Additional Follow-up for Phone Call   SINGULAIR HAS BEEN APPROVED, PHARMACY CALLED  Additional Follow-up by: Richarda Osmond Dragisic,  June 01, 2007 8:18 PM

## 2007-05-31 NOTE — Unmapped (Signed)
Signed by Cleone Slim MD on 05/31/2007 at 00:00:00  Insurance Correspondence      Imported By: Kerrie Pleasure MA 07/06/2007 09:03:57    _____________________________________________________________________    External Attachment:    Please see [employer] EMR for this document.

## 2007-06-01 NOTE — Unmapped (Signed)
Signed by Cleone Slim MD on 06/01/2007 at 00:00:00  Insurance Correspondence      Imported By: Kerrie Pleasure MA 06/22/2007 11:18:06    _____________________________________________________________________    External Attachment:    Please see [employer] EMR for this document.

## 2007-06-01 NOTE — Unmapped (Signed)
Signed by Cleone Slim MD on 06/01/2007 at 00:00:00  Insurance Correspondence      Imported By: Kerrie Pleasure MA 06/07/2007 11:08:42    _____________________________________________________________________    External Attachment:    Please see [employer] EMR for this document.

## 2007-06-09 NOTE — Unmapped (Signed)
Signed by Cleone Slim MD on 06/09/2007 at 10:50:32      Reason for Visit   Chief Complaint: regular 3 month follow up and 90 day refills of all medication    History from: patient    Allergies  No Known Allergies    Medications   ADDERALL (10MG )  TAB (AMPHETAMINE-DEXTROAMPHETAMINE) 1 by mouth every afternoon uad  ALBUTEROL SULFATE HFA 108 MCG/ACT AERS (ALBUTEROL SULFATE) two puffs every four hours as needed  ADVAIR DISKUS 500-50 MCG/DOSE MISC (FLUTICASONE-SALMETEROL) 1 puff bid  ADDERALL XR 15 MG CP24 (AMPHETAMINE-DEXTROAMPHETAMINE) 3 tablets by mouth daily  SINGULAIR 10 MG TAB (MONTELUKAST SODIUM) 1 po daily        Vital Signs:   Ht: 68 in.  Wt: 125 lbs.      BMI: 19.07  BSA: 1.68  Wt chg (lbs): -7  Pulse: 78 (regular)  Resp: 18  BP: 112/70    Intake recorded by: Richarda Osmond Dragisic on June 09, 2007 10:18 AM        HPI Multiple Chronic   Condition(s): Allergic Rhinitis, Asthma.  Additional Dx: ADD; arthralgias: hips, recently also b knees & shldrs  Comments: asthma symptoms  worse w/ inc allergy symptoms  recently  5/08: did have to take prednisone    Current Status:   Compliance with tx: good  Compliance Comments:   as a child had to take theophyllines    ROS/Symptoms:   Patient complains of the following respiratory symptoms: having to use albuterol 4-5x/d; chest feels tight; peak flows are lower than usual (usu avg 370; recently averaging  310)    Past History  Past Medical History (reviewed - no changes required):  Environmental Allergies, Asthma, cervical dysplasia, Anxiety, Depression, ADD  Social History (reviewed - no changes required): Marital Status: married,   Employment Status: employed part-time,   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      Physical Examination:   BP: 112/  70    Additional Vitals:   Comments: examined w/ dr Dorna Bloom, resident    Physical Exam- Detail:   General Appearance:  well-developed, well-nourished and in no acute distress.  Respiratory: Respiration un-labored.  Lung fields clear to auscultation.  No wheezing, rales, rhonchi or pleural rub.  Psychiatric: Judgement and insight are within normal limits.  Alert and oriented x3.  No mood disorders noted, appropriate affect.      Comments:   10/07: ANA/RF neg; ESR 22    Follow-up for Test Results:     New Problems:  ALLERGIC RHINITIS, SEASONAL (ICD-477.9)  New Medications:  PREDNISONE 20 MG TABS (PREDNISONE) one by mouth twice daily X 3 days, then one by mouth daily X 4 days  SPIRIVA HANDIHALER CAPS (TIOTROPIUM BROMIDE MONOHYDRATE CAPS) one daily      Preventive Maintenance   Pap Smear Due: adv  Lipid Panel Due: adv       Assessment and Plan  Status of Existing Problems:  Assessed ASTHMA NOS W/O STATUS ASTHMATICUS as deteriorated - Tyrann Donaho [name] MD  Assessed ADD as unchanged - Lanna Labella [name] MD  New Problems:  Dx of ALLERGIC RHINITIS, SEASONAL (ICD-477.9)  Onset: 06/09/2007    Medications   New Prescriptions/Refills:  SPIRIVA HANDIHALER CAPS (TIOTROPIUM BROMIDE MONOHYDRATE CAPS) one daily  #10 x 0, 06/09/2007, Luzelena Heeg K Jefferson Fullam MD  PREDNISONE 20 MG TABS (PREDNISONE) one by mouth twice  daily X 3 days, then one by mouth daily X 4 days  #10 x 0, 06/09/2007, Tevis Conger K Ignacio Lowder MD  ADDERALL XR 15 MG CP24 (AMPHETAMINE-DEXTROAMPHETAMINE) 3 tablets by mouth daily  #90 x 0, 06/09/2007, Alonna Bartling [name] MD  ADDERALL (10MG )  TAB (AMPHETAMINE-DEXTROAMPHETAMINE) 1 by mouth every afternoon uad  #30 x 0, 06/09/2007, Clayden Withem K Daneen Volcy MD  ADDERALL XR 15 MG CP24 (AMPHETAMINE-DEXTROAMPHETAMINE) 3 tablets by mouth daily  #90 x 0, 06/09/2007, Arda Keadle [name] MD  ADDERALL (10MG )  TAB (AMPHETAMINE-DEXTROAMPHETAMINE) 1 by mouth every afternoon uad  #30 x 0, 06/09/2007, Stepfanie Yott [name] MD  ADDERALL (10MG )  TAB (AMPHETAMINE-DEXTROAMPHETAMINE) 1 by mouth every afternoon uad  #30 x 0, 06/09/2007, Lizzette Carbonell K Rayla Pember MD  ADDERALL XR 15 MG CP24 (AMPHETAMINE-DEXTROAMPHETAMINE) 3 tablets by  mouth daily  #90 x 0, 06/09/2007, Oluwadamilare Tobler K Brilyn Tuller MD  ALBUTEROL SULFATE HFA 108 MCG/ACT AERS (ALBUTEROL SULFATE) two puffs every four hours as needed  #1 hfa x 5, 06/09/2007, Laporshia Hogen K Vylette Strubel MD  ADVAIR DISKUS 500-50 MCG/DOSE MISC (FLUTICASONE-SALMETEROL) 1 puff bid  #3 diskus x 3, 06/09/2007, Remi Rester K Kynedi Profitt MD  SINGULAIR 10 MG TAB (MONTELUKAST SODIUM) 1 po daily  #90 x 3, 06/09/2007, Kinslei Labine [name] MD    New medications:  PREDNISONE 20 MG TABS -- one by mouth twice daily X 3 days, then one by mouth daily X 4 days  Start date: 06/09/2007  [facility] HANDIHALER CAPS -- one daily  Start date: 06/09/2007    Assessment and Plan Comments   1035-1049    1. ADD: continue w/ current treatment   2. asthma: continue w/ current treatment, trial w/ prednisone for few days and trial w/ samples v spiriva; refer to dr Ninfa Linden  3. arthralgias: will monitor    Today's Orders   Allergy Consult [[id number]]  99214 - Ofc Vst, Est Level IV [[id number]]    Disposition:   Return to clinic for Doctor Visit in 3 month(s)             Prescriptions:  SPIRIVA HANDIHALER CAPS (TIOTROPIUM BROMIDE MONOHYDRATE CAPS) one daily  #10 x 0   Entered and Authorized by: Lesieli Bresee [name] MD   Signed by: Cleone Slim MD on 06/09/2007   Method used: Samples Given   RxID: [id number]  PREDNISONE 20 MG TABS (PREDNISONE) one by mouth twice daily X 3 days, then one by mouth daily X 4 days  #10 x 0   Entered and Authorized by: Luana Tatro [name] MD   Signed by: Cleone Slim MD on 06/09/2007   Method used: Print then Give to Patient   RxID: [id number]  ADDERALL XR 15 MG CP24 (AMPHETAMINE-DEXTROAMPHETAMINE) 3 tablets by mouth daily  #90 x 0   Entered and Authorized by: Dolly Harbach [name] MD   Signed by: Cleone Slim MD on 06/09/2007   Method used: Print then Give to Patient   RxID: [id number]  ADDERALL (10MG )  TAB (AMPHETAMINE-DEXTROAMPHETAMINE) 1 by mouth every afternoon uad  #30 x 0   Entered and Authorized by: Idell Hissong [name] MD   Signed by: Cleone Slim MD on  06/09/2007   Method used: Print then Give to Patient   RxID: [id number]  ADDERALL XR 15 MG CP24 (AMPHETAMINE-DEXTROAMPHETAMINE) 3 tablets by mouth daily  #90 x 0   Entered and Authorized by: Contrina Orona [name] MD   Signed by: Cleone Slim MD on 06/09/2007   Method used: Print then Give to Patient   RxID:  [id number]  ADDERALL (10MG )  TAB (AMPHETAMINE-DEXTROAMPHETAMINE) 1 by mouth every afternoon uad  #30 x 0   Entered and Authorized by: Lakiah Dhingra [name] MD   Signed by: Cleone Slim MD on 06/09/2007   Method used: Print then Give to Patient   RxID: [id number]  ADDERALL (10MG )  TAB (AMPHETAMINE-DEXTROAMPHETAMINE) 1 by mouth every afternoon uad  #30 x 0   Entered and Authorized by: Georgana Romain [name] MD   Signed by: Cleone Slim MD on 06/09/2007   Method used: Print then Give to Patient   RxID: [id number]  ADDERALL XR 15 MG CP24 (AMPHETAMINE-DEXTROAMPHETAMINE) 3 tablets by mouth daily  #90 x 0   Entered and Authorized by: Bryker Fletchall [name] MD   Signed by: Cleone Slim MD on 06/09/2007   Method used: Print then Give to Patient   RxID: [id number]  ALBUTEROL SULFATE HFA 108 MCG/ACT AERS (ALBUTEROL SULFATE) two puffs every four hours as needed  #1 hfa x 5   Entered and Authorized by: Lue Sykora [name] MD   Signed by: Cleone Slim MD on 06/09/2007   Method used: Print then Give to Patient   RxID: [id number]  ADVAIR DISKUS 500-50 MCG/DOSE MISC (FLUTICASONE-SALMETEROL) 1 puff bid  #3 diskus x 3   Entered and Authorized by: Bedford Winsor [name] MD   Signed by: Cleone Slim MD on 06/09/2007   Method used: Print then Give to Patient   RxID: [id number]  SINGULAIR 10 MG TAB (MONTELUKAST SODIUM) 1 po daily  #90 x 3   Entered and Authorized by: Storm Sovine [name] MD   Signed by: Cleone Slim MD on 06/09/2007   Method used: Print then Give to Patient   RxID: [id number]                ]

## 2007-08-21 LAB — OFFICE VISIT LAB RESULTS
Bilirubin Urine: NEGATIVE
Glucose, UA: NEGATIVE
Ketones, UA: NEGATIVE
Leukocytes, UA: NEGATIVE
Nitrite, UA: NEGATIVE
Specific Gravity, UA: 1.01
Urobilinogen, UA: NORMAL
pH, UA: 5

## 2007-08-21 NOTE — Unmapped (Signed)
Signed by Peggye Fothergill MD on 08/22/2007 at 16:23:25      Reason for Visit   Chief Complaint: rule out uti    History from: patient    Allergies  No Known Allergies    Medications   ADDERALL (10MG )  TAB (AMPHETAMINE-DEXTROAMPHETAMINE) 1 by mouth every afternoon uad  ALBUTEROL SULFATE HFA 108 MCG/ACT AERS (ALBUTEROL SULFATE) two puffs every four hours as needed  ADVAIR DISKUS 500-50 MCG/DOSE MISC (FLUTICASONE-SALMETEROL) 1 puff bid  ADDERALL XR 15 MG CP24 (AMPHETAMINE-DEXTROAMPHETAMINE) 3 tablets by mouth daily  SINGULAIR 10 MG TAB (MONTELUKAST SODIUM) 1 po daily  SPIRIVA HANDIHALER CAPS (TIOTROPIUM BROMIDE MONOHYDRATE CAPS) one daily        Vital Signs:   Ht: 66.5 in.  Wt: 128 lbs.      BMI: 20.42  BSA: 1.67  Wt chg (lbs): 3  Temperature: 98.1   oral  Pulse: 60 (regular)  Resp: 18  BP: 112/76  Cuff size: regular    Pulse Oximetry:   O2 Saturation: 99 % Urine Dip: Done       History of Present Illness: Mary [name]  here w ith  symptoms  of  pubic  pressure  f or  2  mths  Urinary  frequency  dysuria  s ince   3  days   No blood  in urineNo  vaginal symptoms  Mary [name]  tells  me   she  is  so  busy a t  work  that s he  has  to hold  her  urine  for  12  hrs  before  she  can  relieve  self  No  odor   or  blood  in the  urine  Denies  back  pain  also  Mary [name]  wants a   refill of  spiriva  She  thinks  it  is  helping  her  s x  of  asthma  she  ahs  been  feeling  better  no a cute  s x     Past History  Past Medical History (reviewed - no changes required):  Environmental Allergies, Asthma, cervical dysplasia, Anxiety, Depression, ADD  Surgical History (reviewed - no changes required):  conization 1991    Family History (reviewed - no changes required): father: htn  mother: cancer (?uterine)  siblings: A&W  children: asthma/ allergy  remote: bone cancer  Social History (reviewed - no changes required): Marital Status: married,   Employment Status: employed part-time,   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      Physical Examination:   BP: 112/  76    Physical Exam- Detail:   General Appearance: well-developed, well-nourished and in no acute distress.  Respiratory: Respiration un-labored.  Lung fields clear to auscultation.  No wheezing, rales, rhonchi or pleural rub.  Neck: No thyromegaly.  No nodules, masses or tenderness.  Lymphatic: Areas palpated not enlarged:  cervical, supraclavicular.  Cardiac: S1 and S2 normal.  RRR without murmurs, rubs, gallops.  No JVD.  Vascular: No carotid bruits.  No edema or varicosities.  Abdomen: No masses ,only  suprapubic  tenderness. Bowel sounds active x4 quad.  Liver and spleen are without tenderness or enlargement.  No hernias.No  CVA  tenderness  Psychiatric: Judgement and insight are within normal limits.  Alert and oriented x3.  No mood  disorders noted, appropriate affect.  Musculoskeletal: Gait coordinated and smooth.  Digits are without clubbing or cyanosis.      In office Procedures & Tests     Routine Urinalysis     Physical characteristics   Color: yellow  Appearance: clear    Chemical measurements   Glucose (mg/dL): negative  Bilirubin: negative  Ketone (mg/dL): negative  Spec. Gravity: 1.010  Blood: non-hemolyzed trace  pH: 5  Protein (mg/dL): trace  Urobilinogen (mg/dL): normal  Nitrite: negative  Leukocytes: negative         Follow-up for Test Results:     New Problems:  FREQUENCY, URINARY (ICD-788.41)  New Medications:  BACTRIM DS   TABS (SULFAMETHOXAZOLE-TRIMETHOPRIM TABS) 1  by mouth  bid      Preventive Maintenance   Pap Smear Due: adv  Lipid Panel Due: adv    Assessment and Plan  New Problems:  Dx of FREQUENCY, URINARY (ICD-788.41)  Onset: 08/21/2007    Medications   New Prescriptions/Refills:  ADVAIR DISKUS 500-50 MCG/DOSE MISC (FLUTICASONE-SALMETEROL) 1 puff bid  #1 x 0, 08/22/2007, Peggye Fothergill MD  SPIRIVA HANDIHALER CAPS (TIOTROPIUM BROMIDE MONOHYDRATE CAPS) one daily   #20 x 0, 08/22/2007, Peggye Fothergill MD  SPIRIVA HANDIHALER CAPS (TIOTROPIUM BROMIDE MONOHYDRATE CAPS) one daily  #10 x 0, 08/21/2007, Peggye Fothergill MD  BACTRIM DS   TABS (SULFAMETHOXAZOLE-TRIMETHOPRIM TABS) 1  by mouth  bid  #6 x 0, 08/21/2007, Peggye Fothergill MD    New medications:  BACTRIM DS   TABS -- 1  by mouth  bid  Start date: 08/21/2007    Assessment and Plan Comments   Urinary  symptoms  Urine f or  culture  Bactrim  presumptively  asthma  controlled   spiriva samples  a nd  rx  given continue  singulair  f/u  for   CPE.      Today's Orders   Urine Culture   (UC) (395) [CPT-87088]  99214 - Ofc Vst, Est Level IV [CPT-99214]  UA Dipstick (Office) [CPT-81002]            Prescriptions:  ADVAIR DISKUS 500-50 MCG/DOSE MISC (FLUTICASONE-SALMETEROL) 1 puff bid  #1 x 0   Entered and Authorized by: Peggye Fothergill MD   Signed by: Peggye Fothergill MD on 08/22/2007   Method used: Samples Given   RxID: [id number]  SPIRIVA HANDIHALER CAPS (TIOTROPIUM BROMIDE MONOHYDRATE CAPS) one daily  #20 x 0   Entered and Authorized by: Peggye Fothergill MD   Signed by: Peggye Fothergill MD on 08/22/2007   Method used: Samples Given   RxID: [id number]  SPIRIVA HANDIHALER CAPS (TIOTROPIUM BROMIDE MONOHYDRATE CAPS) one daily  #10 x 0   Entered and Authorized by: Peggye Fothergill MD   Signed by: Peggye Fothergill MD on 08/21/2007   Method used: Print then Give to Patient   RxID: [id number]  BACTRIM DS   TABS (SULFAMETHOXAZOLE-TRIMETHOPRIM TABS) 1  by mouth  bid  #6 x 0   Entered and Authorized by: Peggye Fothergill MD   Signed by: Peggye Fothergill MD on 08/21/2007   Method used: Print then Give to Patient   RxID: [id number]                ]

## 2007-08-21 NOTE — Unmapped (Addendum)
Signed by Peggye Fothergill MD on 08/23/2007 at 06:41:03  Patient: Mary [name]  Note: All result statuses are Final unless otherwise noted.    Tests: (1) CULTURE, URINE, ROUTINE (QDL-395)  ! CULTURE                   .              CULTURE, URINE, ROUTINE                 MICRO NUMBER:      [id number]        TEST STATUS:       FINAL        SPECIMEN SOURCE:   URINE        SPECIMEN COMMENTS: ADEQUATE        RESULT:            NO GROWTH    Note: An exclamation mark (!) indicates a result that was not dispersed into   the flowsheet.  Document Creation Date: 08/22/2007 5:20 PM  _______________________________________________________________________    (1) Order result status: Final  Collection or observation date-time: 08/21/2007  Requested date-time:   Receipt date-time: 08/21/2007 18:08  Reported date-time: 08/22/2007 17:00  Referring Physician:    Ordering Physician: Maree Erie [facility])  Specimen Source: R  Source: [name] Order Number: [id number]  Lab site: [name]      [address]      [place]  [place]  [address]  Signed by Peggye Fothergill MD on 08/23/2007 at 06:42:33    Pt has  appt t omorrow,will  discuss  the  results  then.

## 2007-08-24 NOTE — Unmapped (Signed)
Signed by Peggye Fothergill MD on 08/25/2007 at 10:37:59      Reason for Visit   Chief Complaint: CPE and Pap    History from: patient    Allergies  No Known Allergies    Medications     Vital Signs:   Ht: 65.75 in.  Wt: 126 lbs.      BMI: 20.57  BSA: 1.64  Wt chg (lbs): -2  Pulse: 80  LMP: 08/10/2007  BP: 120/76    Visual Exam   Corrective lenses: none    Acuity   Left: 20/20  Right: 20/25    Audiometry Screening   Left ear-500 hz: 25  Right ear-500 hz: 25  Left ear-1000 hz: 25  Right ear-1000 hz: 25  Left ear-2000 hz: 25  Right ear-2000 hz: 25  Left ear-4000 hz: 40  Right ear-4000 hz: 40        History of Present Illness   Chief Complaint: pap    Menses   Still having periods? Yes  LMP: 08/10/2007  Reliability: approximate  Character: normal  Dysmenorrhea: mild to  moderate  Duration of flow: 3 days  Frequency: every 30 days    GU Cancer Screening   Uterus: present  Ovaries: present  Abnormal Bleeding: No  Past Abnormal Pap: Yes    Breast Cancer Screening   Mass/Discharge: no  FH Breast CA: No  Comments: Ha s  saline  implants    Sexuality   Sexual Contact: yes  Birth Control: yes  Type: Vasectomy  Sexual Orientation: heterosexual  # of Partners: 1    Associated Symptoms:   Denies: dysuria, dyspareunia, fever, pelvic pain, perineal pain, vaginal discharge      Past History  Past Medical History (reviewed - no changes required):  Environmental Allergies, Asthma, cervical dysplasia, Anxiety, Depression, ADD  Surgical History (reviewed - no changes required):  conization 1991    Family History (reviewed - no changes required): father: htn  mother: cancer (?uterine)  siblings: A&W  children: asthma/ allergy  remote: bone cancer  Social History (reviewed - no changes required): Marital Status: married,   Employment Status: employed part-time,   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   Eyes: Denies any specific  issues at this time.   Ears/Nose/Throat: Denies any specific issues at this time.   Breast: Denies any specific issues at this time.   Cardiovascular: Denies any specific issues at this time.   Respiratory: Denies any specific issues at this time.   Gastrointestinal: Denies any specific issues at this time.   Genitourinary: Denies any specific issues at this time.   Musculoskeletal: Denies any specific issues at this time. c/o  of  her  fingers  occ  going  red  and s wollen  .No  numbness or  tingling.  Skin: Denies any specific issues at this time.   Neurologic: Denies any specific issues at this time.   Psychiatric: Denies any specific issues at this time.   Endocrine: Denies any specific issues at this time.   Heme/Lymphatic: Denies any specific issues at this time.   Allergic/Immunologic: Complains of seasonal allergy symptoms. Denies any specific issues at this time.       Physical Examination:   BP: 120/  76    Physical Exam- Detail:   General Appearance: well-developed, well-nourished and in no acute distress.  Eyes: Sclera  white, conjunctiva without injection and pallor.  PERRLA.  EOMI  Ears: No lesions.  Tympanic membranes translucent, non-bulging.  Canal walls pink, without discharge.  Hearing grossly intact.  Nose/Face: Mucosa and turbinates pink, septum midline. No polyps, no discharge, no lesions.  Oropharynx: Normal appearance.  No erythema, exudate or mass. No tonsillar swelling.  Oral Cavity: Gums pink, good dentition.  Oral mucosa and tongue without lesions.  Respiratory: Respiration un-labored.  Lung fields clear to auscultation.  No wheezing, rales, rhonchi or pleural rub.  Neck: No thyromegaly.  No nodules, masses or tenderness.  Lymphatic: Areas palpated not enlarged:  cervical, supraclavicular.  Breast: Breasts symmetrical.  No lumps, masses, discharge, tenderness or dimpling.  Cardiac: S1 and S2 normal.  RRR without murmurs, rubs, gallops.  No JVD.  Vascular: No carotid bruits.  No edema or  varicosities.  Abdomen: No masses or tenderness. Bowel sounds active x4 quad.  Liver and spleen are without tenderness or enlargement.  No hernias.  Rectal Exam: ext  hemmorrhoids  present.  Female G/U: No external masses, lesions, scars, rashes, or swelling of vulva.  Labia, clitoris, vaginal orifice, and urethral meatus intact without discharge.    Uterus/Adnexa Uterus midline, not enlarged, non-tender, firm and smooth.  No adnexal masses or tenderness.  Cervix Cervix pink and without lesions or discharge.  Chaperone: present  Initials: ellison  Neurologic: Cranial nerves 2 through 12 intact.  Deep tendon reflexes 2+ bilaterally.  Sensation intact.  No spasticity.  Strength is 5/5 in upper and lower extremities bilaterally.  Psychiatric: Judgement and insight are within normal limits.  Alert and oriented x3.  No mood disorders noted, appropriate affect.  Musculoskeletal: Gait coordinated and smooth.  Digits are without clubbing or cyanosis.           Follow-up for Test Results:     New Problems:  PREVENTIVE HEALTH CARE (ICD-V70.0)  ROUTINE GYNECOLOGICAL EXAMINATION (ICD-V72.31)  SCREENING OTHER&UNSPEC CARDIOVASCULAR CONDITIONS (ICD-V81.2)  SCREENING, DIABETES MELLITUS (ICD-V77.1)  SCREENING FOR THYROID DISORDER (ICD-V77.0)  SCREENING, IRON DEFICIENCY ANEMIA (ICD-V78.0)      Preventive Maintenance   Pap Smear Due: adv  Lipid Panel Due: adv    Assessment and Plan  New Problems:  Dx of PREVENTIVE HEALTH CARE (ICD-V70.0)  Onset: 08/24/2007  Dx of ROUTINE GYNECOLOGICAL EXAMINATION (ICD-V72.31)  Onset: 08/24/2007  Dx of SCREENING OTHER&UNSPEC CARDIOVASCULAR CONDITIONS (ICD-V81.2)  Onset: 08/24/2007  Dx of SCREENING, DIABETES MELLITUS (ICD-V77.1)  Onset: 08/24/2007  Dx of SCREENING FOR THYROID DISORDER (ICD-V77.0)  Onset: 08/24/2007  Dx of SCREENING, IRON DEFICIENCY ANEMIA (ICD-V78.0)  Onset: 08/24/2007  Assessment and Plan Comments   Cpe  screening labs.    Today's Orders   Lipid Profile   (FATS) (7600) [CPT-80061]  CBC  without Diff   (CBC) (1759) [CPT-85027]  Glucose   (GLU) (483) [CPT-82947]  TSH   (TSH) (899) [MWN-02725]  Cytology, Pap / HPV Reflex  (ThinPrep) (36644) [IHK-74259]  56387 - Preventive, Estab, 18-39 yr [CPT-99395]  Audiometry / Pure Tone Hearing [CPT-92552]  Visual Acuity - Bilateral [CPT-99173]    Disposition:   as needed                         ]

## 2007-09-04 NOTE — Unmapped (Addendum)
Signed by Peggye Fothergill MD on 09/04/2007 at 09:19:55              September 04, 2007      [facility]  [address] [place], [place] [address]                                                                                           RE:  TEST RESULTS   SIRMONS Lust--Aug 21, 1971)         Dear Ms. Lamphear:      The following is an interpretation of your most recent tests.  Please take note of any instructions provided.  Pap Smear:  normal - repeat in 1 year         Sincerely,        Peggye Fothergill MD          Signed by Richarda Osmond Dragisic on 09/04/2007 at 13:01:41    .ls

## 2007-09-26 NOTE — Unmapped (Signed)
Signed by Cleone Slim MD on 09/26/2007 at 13:40:22      Reason for Visit   Chief Complaint: med refills    History from: patient    Allergies  No Known Allergies    Medications   ADDERALL (10MG )  TAB (AMPHETAMINE-DEXTROAMPHETAMINE) 1 by mouth every afternoon uad  ALBUTEROL SULFATE HFA 108 MCG/ACT AERS (ALBUTEROL SULFATE) two puffs every four hours as needed  ADVAIR DISKUS 500-50 MCG/DOSE MISC (FLUTICASONE-SALMETEROL) 1 puff bid  ADDERALL XR 15 MG CP24 (AMPHETAMINE-DEXTROAMPHETAMINE) 3 tablets by mouth daily  SINGULAIR 10 MG TAB (MONTELUKAST SODIUM) 1 po daily  SPIRIVA HANDIHALER CAPS (TIOTROPIUM BROMIDE MONOHYDRATE CAPS) one daily        Vital Signs:   Ht: 65.75 in.  Wt: 131 lbs.      BMI: 21.38  BSA: 1.67  Wt chg (lbs): 5  Temperature: 98.6  degrees F  oral  Pulse: 84 (regular)  BP: 118/84    Pulse Oximetry:   O2 Saturation: 100 %   Intake recorded by: Danton Sewer MA on September 26, 2007 1:23 PM        ATTENTION DEFICIT/ HYPERACTIVE SYMPTOMS     Other Comments about ADD Symptoms:     ADD fu  tx: adderall: c/w this, does take an occl afternoon dose, dosing v afternoon depends on what she is doing at that time (ex work)  s/e: some insomnia noted occly  concentration: current tx regimen helps a lot      Past History  Past Medical History (reviewed - no changes required):  Environmental Allergies, Asthma, cervical dysplasia, Anxiety, Depression, ADD  Social History (reviewed - no changes required): Marital Status: married,   Employment Status: employed part-time,   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       Physical Examination:   BP: 118/  84    Physical Exam- Detail:   General Appearance: well-developed, well-nourished and in no acute distress.  Respiratory: Respiration un-labored.  Lung fields clear to  auscultation.  No wheezing, rales, rhonchi or pleural rub.  Cardiac: S1 and S2 normal.  RRR without murmurs, rubs, gallops.   Psychiatric: Judgement and insight are within normal limits.  Alert and oriented x3.  No mood disorders noted, appropriate affect.           Follow-up for Test Results:     New Medications:  ADVAIR DISKUS 250-50 MCG/DOSE MISC (FLUTICASONE-SALMETEROL) one inhale twice a day      Preventive Maintenance   Pap Smear Due: adv  Lipid Panel Due: adv      Medications   New Prescriptions/Refills:  ADDERALL XR 15 MG CP24 (AMPHETAMINE-DEXTROAMPHETAMINE) 3 tablets by mouth daily  #90 x 0, 09/26/2007, Dayvion Sans K Ynez Eugenio MD  ADDERALL (10MG )  TAB (AMPHETAMINE-DEXTROAMPHETAMINE) 1 by mouth every afternoon uad  #30 x 0, 09/26/2007, Blain Hunsucker K Eriyanna Kofoed MD  ADDERALL XR 15 MG CP24 (AMPHETAMINE-DEXTROAMPHETAMINE) 3 tablets by mouth daily  #90 x 0, 09/26/2007, Jace Dowe K Avery Klingbeil MD  ADDERALL (10MG )  TAB (AMPHETAMINE-DEXTROAMPHETAMINE) 1 by mouth every afternoon uad  #30 x 0, 09/26/2007, Dimetri Armitage K Koa Zoeller MD  ADDERALL XR 15 MG CP24 (AMPHETAMINE-DEXTROAMPHETAMINE) 3 tablets by mouth daily  #90 x 0, 09/26/2007, Yevonne Yokum K Jquan Egelston MD  ADDERALL (10MG )  TAB (AMPHETAMINE-DEXTROAMPHETAMINE) 1 by mouth  every afternoon uad  #30 x 0, 09/26/2007, Sai Moura [name] MD  ADVAIR DISKUS 250-50 MCG/DOSE MISC (FLUTICASONE-SALMETEROL) one inhale twice a day  #1 diskus x 3, 09/26/2007, Varnell Donate [name] MD    New medications:  ADVAIR DISKUS 250-50 MCG/DOSE MISC -- one inhale twice a day  Start date: 09/26/2007    Assessment and Plan Comments   125-    1. ADD: continue w/ current treatment   2. asthma: can try dec dose v advair to 250 dose until spring    Today's Orders   99213 - Ofc Vst, Est Level III [[id number]]            Prescriptions:  ADDERALL XR 15 MG CP24 (AMPHETAMINE-DEXTROAMPHETAMINE) 3 tablets by mouth daily  #90 x 0   Entered and Authorized by: Kilynn Fitzsimmons [name] MD   Signed by: Cleone Slim MD on 09/26/2007   Method used: Print then Give to  Patient   RxID: [id number]  ADDERALL (10MG )  TAB (AMPHETAMINE-DEXTROAMPHETAMINE) 1 by mouth every afternoon uad  #30 x 0   Entered and Authorized by: Daquan Crapps [name] MD   Signed by: Cleone Slim MD on 09/26/2007   Method used: Print then Give to Patient   RxID: [id number]  ADDERALL XR 15 MG CP24 (AMPHETAMINE-DEXTROAMPHETAMINE) 3 tablets by mouth daily  #90 x 0   Entered and Authorized by: Terralyn Matsumura [name] MD   Signed by: Cleone Slim MD on 09/26/2007   Method used: Print then Give to Patient   RxID: [id number]  ADDERALL (10MG )  TAB (AMPHETAMINE-DEXTROAMPHETAMINE) 1 by mouth every afternoon uad  #30 x 0   Entered and Authorized by: Mikailah Morel [name] MD   Signed by: Cleone Slim MD on 09/26/2007   Method used: Print then Give to Patient   RxID: [id number]  ADDERALL XR 15 MG CP24 (AMPHETAMINE-DEXTROAMPHETAMINE) 3 tablets by mouth daily  #90 x 0   Entered and Authorized by: Hildred Pharo [name] MD   Signed by: Cleone Slim MD on 09/26/2007   Method used: Print then Give to Patient   RxID: [id number]  ADDERALL (10MG )  TAB (AMPHETAMINE-DEXTROAMPHETAMINE) 1 by mouth every afternoon uad  #30 x 0   Entered and Authorized by: Casanova Schurman [name] MD   Signed by: Cleone Slim MD on 09/26/2007   Method used: Print then Give to Patient   RxID: [id number]  ADVAIR DISKUS 250-50 MCG/DOSE MISC (FLUTICASONE-SALMETEROL) one inhale twice a day  #1 diskus x 3   Entered and Authorized by: Mikhaela Zaugg [name] MD   Signed by: Cleone Slim MD on 09/26/2007   Method used: Print then Give to Patient   RxID: [id number]

## 2007-10-24 NOTE — Unmapped (Signed)
Signed by Richarda Osmond Dragisic on 10/24/2007 at 10:40:56    Phone Note   Call from Pharmacy  Pharmacy Name: [facility]  Pharmacy Phone Number: [phone]  Pharmacy Fax Number: [phone]  Summary of call: Faxed rx refill request: Budeprion XL 150mg  tab.  Qty: 30.  Take 1 tab by mouth everyday.   Initial call taken by: Marinell Blight,  October 24, 2007 9:44 AM    Follow-up for Phone Call   forwarded to Dr Thedore Mins, I do not see this medication in her medications list  Follow-up by: Richarda Osmond Dragisic,  October 24, 2007 9:50 AM      Additional Follow-up for Phone Call   looks like she was taking this (wellbutrin)last year, last refill in jan #30 w/ 5 refills  ?resuming or been taking all along?  Additional Follow-up by: Cleone Slim MD,  October 24, 2007 10:35 AM    Additional Follow-up for Phone Call   medicatino called in and attempted to call patient to find out status of taking the wellbutrin but no one was home, message left for patient to return our call  Additional Follow-up by: Richarda Osmond Dragisic,  October 24, 2007 10:40 AM  Prescriptions:  WELLBUTRIN XL 150 MG  TB24 (BUPROPION HCL) 1 by mouth daily  #30 x 5   Entered and Authorized by: Brayam Boeke [name] MD   Signed by: Richarda Osmond Dragisic on 10/24/2007   Method used: Telephoned to ...     [facility] [address]     [address]     [place], [place]  [address]     Ph: [phone]     Fax: [phone]   RxID: [id number]

## 2007-10-26 ENCOUNTER — Inpatient Hospital Stay

## 2007-10-26 NOTE — Unmapped (Signed)
Signed by   [employer] on 10/26/2007 at 17:03:29  Patient: Mary [name]  Note: All result statuses are Final unless otherwise noted.    Tests: (1) DIAG-FOOT 2-VIEWS LT ([id number])    Order NotePricilla Handler Order Number: [id number]    Order Note:     *** VERIFIED ***  [facility]  Reason:  SPRAIN THIS AM  Dict.Staff: Donia Pounds [phone]    Verified By: Donia Pounds     Ver: 10/26/07   5:03 pm  Exams:  DIAG-ANKLE MIN 3-VIEWS LT  DIAG-FOOT 2-VIEWS LT    Left ankle 3 views and left foot 4 views dated 10/26/2007    Indication: Injury    Findings:    AP, lateral and oblique views of the left ankle show no evidence  of acute fracture. There is normal alignment at the ankle joint  with normal appearance of the articular surfaces.    AP, lateral and oblique views of the left foot also show no  evidence of acute fracture or other bony abnormality. The  articular surfaces are normal in appearance.    Impression left ankle:    No acute fracture.    Impression left foot:    No acute fracture.  **** end of result ****    Note: An exclamation mark (!) indicates a result that was not dispersed into   the flowsheet.  Document Creation Date: 10/26/2007 5:03 PM  _______________________________________________________________________    (1) Order result status: Final  Collection or observation date-time: 10/26/2007 16:34:00  Requested date-time: 10/26/2007 16:34:00  Receipt date-time:   Reported date-time: 10/26/2007 17:03:30  Referring Physician: Cindi Carbon NON-STAFF  Ordering Physician: Johnnette Gourd [facility])  Specimen Source:   Source: QRS  Filler Order Number: [id number]  Lab site: [facility]

## 2007-10-26 NOTE — Unmapped (Signed)
Signed by Johnnette Gourd MD on 10/26/2007 at 17:26:25    Phone Note   Outgoing Call    Call placed by: Johnnette Gourd MD,  October 26, 2007 5:25 PM  Summary of call: called home to give results of x-ray. no answer.

## 2007-10-26 NOTE — Unmapped (Signed)
Signed by Johnnette Gourd MD on 10/26/2007 at 17:35:55      Reason for Visit   Chief Complaint: was playing tennis today and jumped up landed sideways on left foot     History from: patient    Allergies  No Known Allergies    Medications     Vital Signs:   Wt: 126 lbs.      BMI: 20.57  BSA: 1.64  Wt chg (lbs): -5  Pulse: 88  BP: 124/82    Intake recorded by: Fulton Mole MA on October 26, 2007 3:49 PM    History of Present Illness   Chief Complaint: Ankle sprain  Pt was playing tennis this afternoon and landed awkewardly on her left ankle.  She denies every having a sprained ankle before or any broken bones.      Past History  Past Medical History (reviewed - no changes required):  Environmental Allergies, Asthma, cervical dysplasia, Anxiety, Depression, ADD  Surgical History (reviewed - no changes required):  conization 1991    Family History (reviewed - no changes required): father: htn  mother: cancer (?uterine)  siblings: A&W  children: asthma/ allergy  remote: bone cancer  Social History (reviewed - no changes required): Marital Status: married,   Employment Status: employed part-time,   Occupation: Charity fundraiser @ [employer]  Alcohol Use: occasionally  Tobacco Usage:non-smoker    Review of Systems   General: Denies fevers, chills, sweats, anorexia, fatigue, malaise, weight loss, weight gain, insomnia.   Eyes: Denies blurring, diplopia, irritation, discharge, vision loss, eye pain, photophobia, redness.   Ears/Nose/Throat: Denies decreased hearing, dysphagia, ear discharge, earache, facial pain, headaches, hoarsness, mouth ulcers/sores, nasal congestion, nasal ulcers/sores, nosebleeds, post nasal drip, rhinorrhea, sinus pain, sinusitis, sneezing, sore throat, tinnitus.   Breast: Denies bleeding, bone pain, dermatitis/skin abnormality, discharge, discoloration, multiple masses, painful breast, single mass, visual change.   Cardiovascular: Denies chest pains, dizziness, dyspnea on exertion, dyspnea at rest, palpitations,  peripheral edema, PND,  presyncope, orthopnea, syncope.   Respiratory: Denies dyspnea, tachypnea, excessive sputum, hemoptysis, wheezing, productive cough, dry cough, choking, clubbing, sputum, stridor, exercise limitation, chest wall deformity, frequent throat clearing, chest pain, cyanosis, nocturnal cough, exercise-induced cough,  hypersomnelance, daytime sleepiness.   Gastrointestinal: Denies nausea, vomiting, diarrhea, constipation, change in bowel habits, abdominal pain, melena, hematochezia, jaundice, spitting, encopresis, hematemesis, abdominal distention, edema, ascites, belching, dysphagia, early satiety, heartburn/indigestion, regurgitation/reflux.   Genitourinary: Denies vaginal discharge, incontinence, day time wetting, dysuria, hematuria, urinary frequency, amenorrhea, menorrhagia, abnormal vaginal bleeding, pelvic pain, nocturia, flank pain, weak stream, precipitance, incomplete empty, retention, enuresis, perineal rash, tea-colored urine.   Musculoskeletal: Complains of joint pain, joint redness, stiffness. Denies arthritis, extremity deformity, back pain, neck pain, joint swelling, muscle cramps, muscle weakness, muscle pains, warmth, limp.   Skin: Denies hair loss, rash, skin color change, birthmarks, growths, dry skin, moles, warts, bites, pruritus, acne, nail disease, petechia, purpura, suspicious lesions.   Neurologic: Denies headache, transient paralysis, weakness, paresthesias, seizures, syncope, tremors, vertigo, poor coordination, history of falls.   Psychiatric: Denies behavioral changes, depression, anxiety, memory loss, mental disturbance, suicidal ideation, homicidal ideation, hallucinations, paranoia, learning disability, hyperactivity.   Endocrine: Denies cold intolerance, heat intolerance, polydipsia, polyphagia, polyuria, weight change, growth problems.   Heme/Lymphatic: Denies bone pain, abnormal bruising,  bleeding, enlarged lymph nodes, node tenderness, pallor.    Allergic/Immunologic: Denies hay fever, HIV exposure, itching eyes, nasal congestion, persistent infections, seasonal allergy symptoms, urticaria.       Physical Examination:   BP: 124/  82  Physical Exam- Detail:   General Appearance: well-developed, well-nourished and in no acute distress.  Eyes: Sclera white, conjunctiva without injection and pallor.  PERRLA.  EOMI  Ears: No lesions.  Tympanic membranes translucent, non-bulging.  Canal walls pink, without discharge.  Hearing grossly intact.  Nose/Face: Mucosa and turbinates pink, septum midline. No polyps, no discharge, no lesions.  Oropharynx: Normal appearance.  No erythema, exudate or mass. No tonsillar swelling.  Oral Cavity: Gums pink, good dentition.  Oral mucosa and tongue without lesions.  Respiratory: Respiration un-labored.  Lung fields clear to auscultation.  No wheezing, rales, rhonchi or pleural rub.  Neck: No thyromegaly.  No nodules, masses or tenderness.  Lymphatic: Areas palpated not enlarged:  cervical, supraclavicular.  Cardiac: S1 and S2 normal.  RRR without murmurs, rubs, gallops.   Left Lower Extremity: Abnormal - tender left dorsum and laterall part of foot proximally, movements normal and weight bearing slight discomfort      Labs/Tests Reviewed  Hemoglobin: 12.0 (07/18/2006 12:38:00 PM)  Hematocrit: 36.0 (07/18/2006 12:38:00 PM)  MCV: 89.3 (07/18/2006 12:38:00 PM)  MCH: 29.9 (07/18/2006 12:38:00 PM)  WBC: 4.7 THOUSAND/UL (07/18/2006 12:38:00 PM)  Platelets: 312 THOUSAND/UL (07/18/2006 12:38:00 PM)  Pap smear: Neg (08/24/2007 12:00:00 AM)       Follow-up for Test Results:     New Problems:  ANKLE SPRAIN (ICD-845.00)      Preventive Maintenance   Pap Smear Due: adv  Lipid Panel Due: adv              Assessment and Plan  New Problems:  Dx of ANKLE SPRAIN (ICD-845.00)   left Onset: 10/26/2007  Assessment and Plan Comments   Ice packs and ACE wrap to left ankle and foot area. can try NSAIDS and or Tylenol.  Patient Instructions   see as  needed      Today's Orders   X-ray, Foot, 2 views [CPT-73620]  X-ray, Ankle- Min 3 View [[id number]]  99213 - Ofc Vst, Est Level III [[id number]]

## 2007-10-26 NOTE — Unmapped (Signed)
Signed by   [employer] on 10/26/2007 at 17:03:30  Patient: Mary [name]  Note: All result statuses are Final unless otherwise noted.    Tests: (1) DIAG-ANKLE MIN 3-VIEWS LT (161096)    Order NotePricilla Handler Order Number: [id number]    Order Note:     *** VERIFIED ***  [facility]  Reason:  SPRAIN THIS AM  Dict.Staff: Donia Pounds [phone]    Verified By: Donia Pounds     Ver: 10/26/07   5:03 pm  Exams:  DIAG-ANKLE MIN 3-VIEWS LT  DIAG-FOOT 2-VIEWS LT    Left ankle 3 views and left foot 4 views dated 10/26/2007    Indication: Injury    Findings:    AP, lateral and oblique views of the left ankle show no evidence  of acute fracture. There is normal alignment at the ankle joint  with normal appearance of the articular surfaces.    AP, lateral and oblique views of the left foot also show no  evidence of acute fracture or other bony abnormality. The  articular surfaces are normal in appearance.    Impression left ankle:    No acute fracture.    Impression left foot:    No acute fracture.  **** end of result ****    Note: An exclamation mark (!) indicates a result that was not dispersed into   the flowsheet.  Document Creation Date: 10/26/2007 5:03 PM  _______________________________________________________________________    (1) Order result status: Final  Collection or observation date-time: 10/26/2007 16:34:00  Requested date-time: 10/26/2007 16:34:00  Receipt date-time:   Reported date-time: 10/26/2007 17:03:30  Referring Physician: Cindi Carbon NON-STAFF  Ordering Physician: Johnnette Gourd [facility])  Specimen Source:   Source: QRS  Filler Order Number: [id number]  Lab site: [facility]

## 2007-10-27 NOTE — Unmapped (Addendum)
Signed by Peggye Fothergill MD on 10/27/2007 at 16:02:37    Phone Note   Patient Call  Caller's Cell Phone #: [phone]  Caller: patient  Department: Family Medicine  Call for: [facility]     Reason for Call: insurance question, test results  Summary of Call: xray done yesterday   in a lot of pain      Initial call taken by: Anders Simmonds,  October 27, 2007 3:41 PM      Follow-up for Phone Call   forwarded to dr August Saucer  Follow-up by: Richarda Osmond Dragisic,  October 27, 2007 3:52 PM    Additional Follow-up for Phone Call   informed  Xrays  are  normal.  Additional Follow-up by: Peggye Fothergill MD,  October 27, 2007 4:02 PM    Signed by Peggye Fothergill MD on 11/07/2007 at 15:32:39          Clinical Lists Changes    Orders:  Added new Service order of Specimen Handling (CPT-99000) - Signed  Added new Service order of Pap Smear ([id number]) - Signed

## 2007-12-28 NOTE — Unmapped (Signed)
Signed by Cleone Slim MD on 12/28/2007 at 10:58:40      Reason for Visit   Chief Complaint: adderall refill     History from: patient    Allergies  No Known Allergies    Medications   ADDERALL (10MG )  TAB (AMPHETAMINE-DEXTROAMPHETAMINE) 1 by mouth every afternoon uad  ALBUTEROL SULFATE HFA 108 MCG/ACT AERS (ALBUTEROL SULFATE) two puffs every four hours as needed  ADVAIR DISKUS 500-50 MCG/DOSE MISC (FLUTICASONE-SALMETEROL) 1 puff bid  ADDERALL XR 15 MG CP24 (AMPHETAMINE-DEXTROAMPHETAMINE) 3 tablets by mouth daily  SINGULAIR 10 MG TAB (MONTELUKAST SODIUM) 1 po daily  SPIRIVA HANDIHALER CAPS (TIOTROPIUM BROMIDE MONOHYDRATE CAPS) one daily  ADVAIR DISKUS 250-50 MCG/DOSE MISC (FLUTICASONE-SALMETEROL) one inhale twice a day  WELLBUTRIN XL 150 MG  TB24 (BUPROPION HCL) 1 by mouth daily        Vital Signs:   Ht: 6575 in.  Wt: 127 lbs.      BMI: 0.00  BSA: 46.39  Wt chg (lbs): 1  Pulse: 88 (regular)  Resp: 18  BP: 118/82    Intake recorded by: Kerrie Pleasure MA on December 28, 2007 10:34 AM    History of Present Illness   Chief Complaint: fu add  c/w adderall 45mg /d; occly takes 10mg  adderall depending on work duties etc  s/e: none if doenst take for few days gets fatigue;   concentration: satisfied w/ concentration w/ current tx; if misses a dose does notice concentration worse    Past History  Past Medical History (reviewed - no changes required):  Environmental Allergies, Asthma, cervical dysplasia, Anxiety, Depression, ADD  Social History (reviewed - no changes required): Marital Status: married,   Employment Status: employed part-time,   Occupation: Chemical engineer  Alcohol Use: occasionally  Tobacco Usage:non-smoker    Review of Systems   Respiratory: recently inc advair to 500, symptoms r better controlled  Psychiatric: Complains of see HPI.       Physical Examination:   BP: 118/  82    Physical Exam- Detail:   General Appearance: well-developed, well-nourished and in no acute distress.  Respiratory: Respiration un-labored.   Lung fields clear to auscultation.  No wheezing, rales, rhonchi or pleural rub.  Psychiatric: Judgement and insight are within normal limits.  Alert and oriented x3.  No mood disorders noted, appropriate affect.           Follow-up for Test Results:         Preventive Maintenance   Pap Smear Due: adv  Lipid Panel Due: adv            Prescriptions:  ADDERALL XR 15 MG CP24 (AMPHETAMINE-DEXTROAMPHETAMINE) 3 tablets by mouth daily  #90 x 0   Entered and Authorized by: Nishika Parkhurst [name] MD   Signed by: Cleone Slim MD on 12/28/2007   Method used: Print then Give to Patient   RxID: [id number]  ADDERALL (10MG )  TAB (AMPHETAMINE-DEXTROAMPHETAMINE) 1 by mouth every afternoon uad  #30 x 0   Entered and Authorized by: Ximenna Fonseca [name] MD   Signed by: Cleone Slim MD on 12/28/2007   Method used: Print then Give to Patient   RxID: [id number]  ADDERALL XR 15 MG CP24 (AMPHETAMINE-DEXTROAMPHETAMINE) 3 tablets by mouth daily  #90 x 0   Entered and Authorized by: Frederico Gerling [name] MD   Signed by: Cleone Slim MD on 12/28/2007   Method used: Print then Give to Patient   RxID: [id number]  ADDERALL (10MG )  TAB (AMPHETAMINE-DEXTROAMPHETAMINE) 1  by mouth every afternoon uad  #30 x 0   Entered and Authorized by: Trevyon Swor [name] MD   Signed by: Cleone Slim MD on 12/28/2007   Method used: Print then Give to Patient   RxID: [id number]  ADDERALL XR 15 MG CP24 (AMPHETAMINE-DEXTROAMPHETAMINE) 3 tablets by mouth daily  #90 x 0   Entered and Authorized by: Aundra Espin [name] MD   Signed by: Cleone Slim MD on 12/28/2007   Method used: Print then Give to Patient   RxID: [id number]  ADDERALL (10MG )  TAB (AMPHETAMINE-DEXTROAMPHETAMINE) 1 by mouth every afternoon uad  #30 x 0   Entered and Authorized by: Kunaal Walkins [name] MD   Signed by: Cleone Slim MD on 12/28/2007   Method used: Print then Give to Patient   RxID: [id number]  ADVAIR DISKUS 500-50 MCG/DOSE MISC (FLUTICASONE-SALMETEROL) 1 puff bid  #1 x 1   Entered and Authorized  by: Danney Bungert [name] MD   Signed by: Cleone Slim MD on 12/28/2007   Method used: Print then Give to Patient   RxID: [id number]      Assessment and Plan  Status of Existing Problems:  Assessed ADD as improved - Zerina Hallinan [name] MD  Assessed ASTHMA NOS W/O STATUS ASTHMATICUS as deteriorated - Terald Jump [name] MD    Medications   New Prescriptions/Refills:  ADDERALL XR 15 MG CP24 (AMPHETAMINE-DEXTROAMPHETAMINE) 3 tablets by mouth daily  #90 x 0, 12/28/2007, Shalunda Lindh K San Rua MD  ADDERALL (10MG )  TAB (AMPHETAMINE-DEXTROAMPHETAMINE) 1 by mouth every afternoon uad  #30 x 0, 12/28/2007, Denham Mose K Sundiata Ferrick MD  ADDERALL XR 15 MG CP24 (AMPHETAMINE-DEXTROAMPHETAMINE) 3 tablets by mouth daily  #90 x 0, 12/28/2007, Khali Perella K Jamarie Mussa MD  ADDERALL (10MG )  TAB (AMPHETAMINE-DEXTROAMPHETAMINE) 1 by mouth every afternoon uad  #30 x 0, 12/28/2007, Jahaziel Francois K Kadra Kohan MD  ADDERALL XR 15 MG CP24 (AMPHETAMINE-DEXTROAMPHETAMINE) 3 tablets by mouth daily  #90 x 0, 12/28/2007, Trinnity Breunig K Brieanna Nau MD  ADDERALL (10MG )  TAB (AMPHETAMINE-DEXTROAMPHETAMINE) 1 by mouth every afternoon uad  #30 x 0, 12/28/2007, Drayton Tieu K Alexarae Oliva MD  ADVAIR DISKUS 500-50 MCG/DOSE MISC (FLUTICASONE-SALMETEROL) 1 puff bid  #1 x 1, 12/28/2007, Shalise Rosado [name] MD    Assessment and Plan Comments     1. ADD: continue w/ current treatment  2. asthma: cont w/ 500-advair w/ as needed albuterol for now    Today's Orders   99213 - Ofc Vst, Est Level III [[id number]]    Disposition:   Return to clinic for Doctor Visit in 3 month(s)

## 2008-02-28 NOTE — Unmapped (Signed)
Signed by Danton Sewer MA on 02/29/2008 at 08:41:14    Phone Note   Call from Pharmacy  Pharmacy Name: [facility]  Pharmacy Phone Number: [phone]  Pharmacy Fax Number: [phone]  Reason for Call: needs renewal  Details for Reason: Spiriva  Qty: 30.  Inhale contents of one cap once daily using handihaler.    Initial call taken by: Marinell Blight,  Feb 28, 2008 3:05 PM    Follow-up for Phone Call   forwarded todr   Follow-up by: Kerrie Pleasure MA,  Feb 28, 2008 3:31 PM      Additional Follow-up for Phone Call   Phone Call Completed, Prescription Completed  Additional Follow-up by: Danton Sewer MA,  Feb 29, 2008 8:40 AM  Prescriptions:  SPIRIVA HANDIHALER CAPS (TIOTROPIUM BROMIDE MONOHYDRATE CAPS) one daily  #30 x 4   Entered and Authorized by: Nimah Uphoff [name] MD   Signed by: Cleone Slim MD on 02/29/2008   Method used: Telephoned to ...     [facility] - [address]     [address]     [place], [place]  [address]     Ph: [phone]     Fax: [phone]   RxID: [id number]

## 2008-03-28 NOTE — Unmapped (Signed)
Signed by Sung Amabile MA on 03/28/2008 at 11:45:41    Phone Note   Patient Call    [facility] Cell Phone #: [phone]  Caller: patient  Call for: Thedore Mins    Follow-up for Phone Call   Spoke with patient; she will be in tomorrow for medication refill.  Follow-up by: Sung Amabile MA,  March 28, 2008 11:45 AM      Reason for call: Adderall 10mg  and 40mg .     Pharmacy Information:   [facility]: [phone].    Summary of Call:  Calling for a medication f/u appt. tomorrow w/Dr. August Saucer.      Initial call taken by: Marinell Blight,  March 28, 2008 11:10 AM

## 2008-03-29 NOTE — Unmapped (Signed)
Signed by Peggye Fothergill MD on 04/01/2008 at 06:50:15      Reason for Visit   Chief Complaint: med refills    History from: patient    Allergies  No Known Allergies    Medications   ADDERALL (10MG )  TAB (AMPHETAMINE-DEXTROAMPHETAMINE) 1 by mouth every afternoon uad  ALBUTEROL SULFATE HFA 108 MCG/ACT AERS (ALBUTEROL SULFATE) two puffs every four hours as needed  ADVAIR DISKUS 500-50 MCG/DOSE MISC (FLUTICASONE-SALMETEROL) 1 puff bid  ADDERALL XR 15 MG CP24 (AMPHETAMINE-DEXTROAMPHETAMINE) 3 tablets by mouth daily  SINGULAIR 10 MG TAB (MONTELUKAST SODIUM) 1 po daily  SPIRIVA HANDIHALER CAPS (TIOTROPIUM BROMIDE MONOHYDRATE CAPS) one daily  ADVAIR DISKUS 250-50 MCG/DOSE MISC (FLUTICASONE-SALMETEROL) one inhale twice a day  WELLBUTRIN XL 150 MG  TB24 (BUPROPION HCL) 1 by mouth daily        Vital Signs:   Wt: 124 lbs.      BMI: 0.00  BSA: 45.92  Wt chg (lbs): -3  Temperature: 98.3  degrees F  oral  Pulse: 92 (regular)  BP: 110/72  Cuff size: regular    Pulse Oximetry:   O2 Saturation: 98 %   Intake recorded by: Danton Sewer MA on March 29, 2008 10:54 AM    History of Present Illness   pt  ha s h/o Addd  on adderall  she  tells me  she is  doing  very well  with the  stimulants  an d she is  the only person using  her prescribed  meds and is not  abusing  no fatigue  chest pain otr inc  work of breathing.  Concentrtion much improved      Review of Systems  Refer to HPI for review of systems documentation.      Physical Examination:   BP: 110/  72    Physical Exam- Detail:   General Appearance: well-developed, well-nourished and in no acute distress.  Respiratory: Respiration un-labored.  Lung fields clear to auscultation.  No wheezing, rales, rhonchi or pleural rub.  Cardiac: S1 and S2 normal.  RRR without murmurs, rubs, gallops.  No JVD.  Psychiatric: Judgement and insight are within normal limits.  Alert and oriented x3.  No mood disorders noted, appropriate affect.          Preventive Maintenance   Pap Smear Due: adv  Lipid  Panel Due: adv            Prescriptions:  ADDERALL XR 15 MG CP24 (AMPHETAMINE-DEXTROAMPHETAMINE) 3 tablets by mouth daily  #90 x 0   Entered and Authorized by: Peggye Fothergill MD   Signed by: Peggye Fothergill MD on 03/29/2008   Method used: Print then Give to Patient   RxID: [id number]  ADDERALL (10MG )  TAB (AMPHETAMINE-DEXTROAMPHETAMINE) 1 by mouth every afternoon uad  #30 x 0   Entered and Authorized by: Peggye Fothergill MD   Signed by: Peggye Fothergill MD on 03/29/2008   Method used: Print then Give to Patient   RxID: [id number]  ADDERALL XR 15 MG CP24 (AMPHETAMINE-DEXTROAMPHETAMINE) 3 tablets by mouth daily  #90 x 0   Entered and Authorized by: Peggye Fothergill MD   Signed by: Peggye Fothergill MD on 03/29/2008   Method used: Print then Give to Patient   RxID: [id number]  ADDERALL (10MG )  TAB (AMPHETAMINE-DEXTROAMPHETAMINE) 1 by mouth every afternoon uad  #30 x 0   Entered and Authorized by: Peggye Fothergill MD   Signed by: Peggye Fothergill MD on 03/29/2008   Method used: Print  then Give to Patient   RxID: [id number]  ADDERALL XR 15 MG CP24 (AMPHETAMINE-DEXTROAMPHETAMINE) 3 tablets by mouth daily  #90 x 0   Entered and Authorized by: Peggye Fothergill MD   Signed by: Peggye Fothergill MD on 03/29/2008   Method used: Print then Give to Patient   RxID: [id number]  ADDERALL (10MG )  TAB (AMPHETAMINE-DEXTROAMPHETAMINE) 1 by mouth every afternoon uad  #30 x 0   Entered and Authorized by: Peggye Fothergill MD   Signed by: Peggye Fothergill MD on 03/29/2008   Method used: Print then Give to Patient   RxID: [id number]      Assessment and Plan   ADD doing  well  refilled  meds for   follow up in  3 mths.    Medications   New Prescriptions/Refills:  ADDERALL XR 15 MG CP24 (AMPHETAMINE-DEXTROAMPHETAMINE) 3 tablets by mouth daily  #90 x 0, 03/29/2008, Peggye Fothergill MD  ADDERALL (10MG )  TAB (AMPHETAMINE-DEXTROAMPHETAMINE) 1 by mouth every afternoon uad  #30 x 0, 03/29/2008, Peggye Fothergill MD  ADDERALL XR 15 MG CP24 (AMPHETAMINE-DEXTROAMPHETAMINE) 3 tablets by mouth  daily  #90 x 0, 03/29/2008, Peggye Fothergill MD  ADDERALL (10MG )  TAB (AMPHETAMINE-DEXTROAMPHETAMINE) 1 by mouth every afternoon uad  #30 x 0, 03/29/2008, Peggye Fothergill MD  ADDERALL XR 15 MG CP24 (AMPHETAMINE-DEXTROAMPHETAMINE) 3 tablets by mouth daily  #90 x 0, 03/29/2008, Peggye Fothergill MD  ADDERALL (10MG )  TAB (AMPHETAMINE-DEXTROAMPHETAMINE) 1 by mouth every afternoon uad  #30 x 0, 03/29/2008, Peggye Fothergill MD    Today's Orders   [phone] - Ofc Vst, Est Level III [[id number]]

## 2008-05-17 ENCOUNTER — Inpatient Hospital Stay

## 2008-05-17 NOTE — Unmapped (Signed)
Signed by Cleone Slim MD on 05/17/2008 at 00:00:00  cervical spine      Imported By: Scharlene Corn 01/18/2009 10:51:55    _____________________________________________________________________    External Attachment:    Please see [employer] EMR for this document.

## 2008-05-31 NOTE — Unmapped (Signed)
Signed by Cleone Slim MD on 05/31/2008 at 10:31:17      Reason for Visit   Chief Complaint: fu ADD, asthma      Allergies  No Known Allergies    Medications   Current Meds:   ADDERALL (10MG )  TAB (AMPHETAMINE-DEXTROAMPHETAMINE) 1 by mouth every afternoon uad  ALBUTEROL SULFATE HFA 108 MCG/ACT AERS (ALBUTEROL SULFATE) two puffs every four hours as needed  ADVAIR DISKUS 500-50 MCG/DOSE MISC (FLUTICASONE-SALMETEROL) 1 puff bid  ADDERALL XR 15 MG CP24 (AMPHETAMINE-DEXTROAMPHETAMINE) 3 tablets by mouth daily  SINGULAIR 10 MG TAB (MONTELUKAST SODIUM) 1 po daily  SPIRIVA HANDIHALER CAPS (TIOTROPIUM BROMIDE MONOHYDRATE CAPS) one daily  ADVAIR DISKUS 250-50 MCG/DOSE MISC (FLUTICASONE-SALMETEROL) one inhale twice a day  WELLBUTRIN XL 150 MG  TB24 (BUPROPION HCL) 1 by mouth daily       Vital Signs:   Ht: 66 in.  Wt: 122 lbs.      BMI: 19.76  BSA: 1.62  Wt chg (lbs): -2  Temperature: 98.1  degrees F  oral  Pulse: 72 (regular)  Resp: 12    Blood Pressure #1: 114/70, right arm        HPI Multiple Chronic   Condition(s): Asthma.  Additional Dx: ADD    Current Status:   Compliance with tx: good  Medication Tolerance: good    ROS/Symptoms:   Patient complains of the following respiratory symptoms: rarely has to use albuterol; advair v. 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      Physical Examination:   BP: 114/  70    Physical Exam- Detail:   General Appearance: well-developed, well-nourished and  in no acute distress.  Oropharynx: Normal appearance.  No erythema, exudate or mass. No tonsillar swelling.  Respiratory: Respiration un-labored.  Lung fields clear to auscultation.  No wheezing, rales, rhonchi or pleural rub.  Psychiatric: Judgement and insight are within normal limits.  Alert and oriented x3.  No mood disorders noted, appropriate affect.          Preventive Maintenance   Pap Smear Due: adv  Lipid Panel Due: adv        Influenza Immunization History:     Influenza # 1:  Historical (05/31/2008)        Prescriptions:  ADDERALL XR 15 MG CP24 (AMPHETAMINE-DEXTROAMPHETAMINE) 3 tablets by mouth daily  #90 x 0   Entered and Authorized by: Haytham Maher [name] MD   Signed by: Cleone Slim MD on 05/31/2008   Method used: Print then Give to Patient   RxID: [id number]  ADDERALL (10MG )  TAB (AMPHETAMINE-DEXTROAMPHETAMINE) 1 by mouth every afternoon uad  #30 x 0   Entered and Authorized by: Shalika Arntz [name] MD   Signed by: Cleone Slim MD on 05/31/2008   Method used: Print then Give to Patient   RxID: [id number]  ADDERALL XR 15 MG CP24 (AMPHETAMINE-DEXTROAMPHETAMINE) 3 tablets by mouth daily  #90 x 0   Entered and Authorized by: Kastiel Simonian [name] MD   Signed  by: Cleone Slim MD on 05/31/2008   Method used: Print then Give to Patient   RxID: [id number]  ADDERALL (10MG )  TAB (AMPHETAMINE-DEXTROAMPHETAMINE) 1 by mouth every afternoon uad  #30 x 0   Entered and Authorized by: Emmanual Gauthreaux [name] MD   Signed by: Cleone Slim MD on 05/31/2008   Method used: Print then Give to Patient   RxID: [id number]  ADDERALL XR 15 MG CP24 (AMPHETAMINE-DEXTROAMPHETAMINE) 3 tablets by mouth daily  #90 x 0   Entered and Authorized by: [name] MD   Signed by: Cleone Slim MD on 05/31/2008   Method used: Print then Give to Patient   RxID: [id number]  ADDERALL (10MG )  TAB (AMPHETAMINE-DEXTROAMPHETAMINE) 1 by mouth every afternoon uad  #30 x 0   Entered and Authorized by: Torell Minder [name] MD   Signed by: Cleone Slim  MD on 05/31/2008   Method used: Print then Give to Patient   RxID: [id number]  ALBUTEROL SULFATE HFA 108 MCG/ACT AERS (ALBUTEROL SULFATE) two puffs every four hours as needed  #3 hfa x 3   Entered and Authorized by: Keera Altidor [name] MD   Signed by: Cleone Slim MD on 05/31/2008   Method used: Print then Give to Patient   RxID: [id number]  ADVAIR DISKUS 250-50 MCG/DOSE MISC (FLUTICASONE-SALMETEROL) one inhale twice a day  #3 diskus x 1   Entered and Authorized by: Shaliyah Taite [name] MD   Signed by: Cleone Slim MD on 05/31/2008   Method used: Print then Give to Patient   RxID: [id number]  SINGULAIR 10 MG TAB (MONTELUKAST SODIUM) 1 po daily  #90 x 3   Entered and Authorized by: Farran Amsden [name] MD   Signed by: Cleone Slim MD on 05/31/2008   Method used: Print then Give to Patient   RxID: [id number]  ADVAIR DISKUS 500-50 MCG/DOSE MISC (FLUTICASONE-SALMETEROL) 1 puff bid  #3 x 1   Entered and Authorized by: Ski Polich [name] MD   Signed by: Cleone Slim MD on 05/31/2008   Method used: Print then Give to Patient   RxID: [id number]      Assessment and Plan   1015-1030    1. ADD: continue w/ current treatment   2. asthma: continue w/ current treatment; utd w/ fluvax    Problems   Status of Existing Problems:  Assessed ASTHMA NOS W/O STATUS ASTHMATICUS as improved - Melanie Openshaw [name] MD  Assessed ADD as improved - Saree Krogh [name] MD    Medications   New Prescriptions/Refills:  ADDERALL XR 15 MG CP24 (AMPHETAMINE-DEXTROAMPHETAMINE) 3 tablets by mouth daily  #90 x 0, 05/31/2008, Clarabel Marion K Beverlie Kurihara MD  ADDERALL (10MG )  TAB (AMPHETAMINE-DEXTROAMPHETAMINE) 1 by mouth every afternoon uad  #30 x 0, 05/31/2008, Tanja Gift K Denya Buckingham MD  ADDERALL XR 15 MG CP24 (AMPHETAMINE-DEXTROAMPHETAMINE) 3 tablets by mouth daily  #90 x 0, 05/31/2008, Aharon Carriere K Luqman Perrelli MD  ADDERALL (10MG )  TAB (AMPHETAMINE-DEXTROAMPHETAMINE) 1 by mouth every afternoon uad  #30 x 0, 05/31/2008, Lise Pincus K Carlin Mamone MD  ADDERALL XR 15 MG CP24 (AMPHETAMINE-DEXTROAMPHETAMINE)  3 tablets by mouth daily  #90 x 0, 05/31/2008, Brittyn Salaz K Chirag Krueger MD  ADDERALL (10MG )  TAB (AMPHETAMINE-DEXTROAMPHETAMINE) 1 by mouth every afternoon uad  #30 x 0, 05/31/2008, Jonael Paradiso K Ermine Stebbins MD  ALBUTEROL SULFATE HFA 108 MCG/ACT AERS (ALBUTEROL SULFATE) two puffs every four hours as needed  #3 hfa x 3, 05/31/2008, Rheda Kassab [name] MD  ADVAIR DISKUS 250-50 MCG/DOSE MISC (FLUTICASONE-SALMETEROL) one  inhale twice a day  #3 diskus x 1, 05/31/2008, Reilyn Nelson K Lorence Nagengast MD  SINGULAIR 10 MG TAB (MONTELUKAST SODIUM) 1 po daily  #90 x 3, 05/31/2008, Karita Dralle [name] MD  ADVAIR DISKUS 500-50 MCG/DOSE MISC (FLUTICASONE-SALMETEROL) 1 puff bid  #3 x 1, 05/31/2008, Gamble Enderle [name] MD    Today's Orders   [phone] - Ofc Vst, Est Level III [GNF-62130]    Disposition:   Return to clinic for Doctor Visit in 3 month(s)

## 2008-06-03 NOTE — Unmapped (Signed)
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

## 2008-10-07 NOTE — Unmapped (Signed)
Signed by Cleone Slim MD on 10/07/2008 at 16:17:22      Reason for Visit   Chief Complaint: follow up on meds    History from: patient    Allergies  No Known Allergies    Medications   Current Meds:   ADDERALL (10MG )  TAB (AMPHETAMINE-DEXTROAMPHETAMINE) 1 by mouth every afternoon uad  ALBUTEROL SULFATE HFA 108 MCG/ACT AERS (ALBUTEROL SULFATE) two puffs every four hours as needed  ADDERALL XR 15 MG CP24 (AMPHETAMINE-DEXTROAMPHETAMINE) 3 tablets by mouth daily        Vital Signs:   Ht: 66 in.  Wt: 122 lbs.      BMI: 19.76  BSA: 1.62  Wt chg (lbs): 0  Temperature: 97.9  degrees F  oral  Pulse: 72  BP: 108/70  Cuff size: regular    Pulse Oximetry:   O2 Saturation: 100 %   Intake recorded by: Lillia Mountain MA on October 07, 2008 3:55 PM    History of Present Illness   Chief Complaint: FU ADD  pt  has h/o ADD  on adderall  c/w tx, takes adderall closer to beginning v shift w/ the extra as needed 10mg  dose   doing  very well  with the  stimulants    s/e: none reported x occl sleep disturbance, altho may b r/t shift  Concentrtion much improved: main issue is @ work    Past History  Past Medical History (reviewed - no changes required):  Environmental Allergies, Asthma, Chronic Back Pain, cervical dysplasia, Anxiety, Depression, ADD  Social History (reviewed - no changes required): Marital Status: married,   Employment Status: employed part-time,   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      Physical Examination:   BP: 108/  70    Physical Exam- Detail:   General Appearance: well-developed, well-nourished and in no acute distress.  Psychiatric: Judgement and insight are within normal limits.  Alert and oriented x3.  No mood disorders noted, appropriate affect.          Preventive Maintenance   Pap Smear Due: adv  Smoking Status: non-smoker  Lipid Panel Due: adv            Prescriptions:  ADDERALL XR 15 MG CP24  (AMPHETAMINE-DEXTROAMPHETAMINE) 3 tablets by mouth daily  #90 x 0   Entered and Authorized by: Zyair Russi [name] MD   Signed by: Cleone Slim MD on 10/07/2008   Method used: Print then Give to Patient   RxID: [id number]  ADDERALL (10MG )  TAB (AMPHETAMINE-DEXTROAMPHETAMINE) 1 by mouth every afternoon uad  #30 x 0   Entered and Authorized by: Yosgar Demirjian [name] MD   Signed by: Cleone Slim MD on 10/07/2008   Method used: Print then Give to Patient   RxID: [id number]  ADDERALL XR 15 MG CP24 (AMPHETAMINE-DEXTROAMPHETAMINE) 3 tablets by mouth daily  #90 x 0   Entered and Authorized by: Teya Otterson [name] MD   Signed by: Cleone Slim MD on 10/07/2008   Method used: Print then Give to Patient   RxID: [id number]  ADDERALL (10MG )  TAB (AMPHETAMINE-DEXTROAMPHETAMINE) 1 by mouth every afternoon uad  #30 x 0   Entered and Authorized by: Negan Grudzien [name] MD   Signed by: Cleone Slim MD on 10/07/2008   Method used: Print then Give to Patient   RxID: [id number]  ADDERALL  XR 15 MG CP24 (AMPHETAMINE-DEXTROAMPHETAMINE) 3 tablets by mouth daily  #90 x 0   Entered and Authorized by: Junaid Wurzer [name] MD   Signed by: Cleone Slim MD on 10/07/2008   Method used: Print then Give to Patient   RxID: [id number]  ADDERALL (10MG )  TAB (AMPHETAMINE-DEXTROAMPHETAMINE) 1 by mouth every afternoon uad  #30 x 0   Entered and Authorized by: Daena Alper [name] MD   Signed by: Cleone Slim MD on 10/07/2008   Method used: Print then Give to Patient   RxID: [id number]      Assessment and Plan   [phone]    1. ADD: doing well, continue w/ current treatment     Problems   Status of Existing Problems:  Assessed ADD as improved - Jamacia Jester [name] MD    Medications   New Prescriptions/Refills:  ADDERALL XR 15 MG CP24 (AMPHETAMINE-DEXTROAMPHETAMINE) 3 tablets by mouth daily  #90 x 0, 10/07/2008, Rushawn Capshaw [name] MD  ADDERALL (10MG )  TAB (AMPHETAMINE-DEXTROAMPHETAMINE) 1 by mouth every afternoon uad  #30 x 0, 10/07/2008, Riggin Cuttino K Risa Auman MD  ADDERALL XR  15 MG CP24 (AMPHETAMINE-DEXTROAMPHETAMINE) 3 tablets by mouth daily  #90 x 0, 10/07/2008, Otila Starn K Neal Oshea MD  ADDERALL (10MG )  TAB (AMPHETAMINE-DEXTROAMPHETAMINE) 1 by mouth every afternoon uad  #30 x 0, 10/07/2008, Ellanie Oppedisano K Nettye Flegal MD  ADDERALL XR 15 MG CP24 (AMPHETAMINE-DEXTROAMPHETAMINE) 3 tablets by mouth daily  #90 x 0, 10/07/2008, Leightyn Cina K Annaleah Arata MD  ADDERALL (10MG )  TAB (AMPHETAMINE-DEXTROAMPHETAMINE) 1 by mouth every afternoon uad  #30 x 0, 10/07/2008, Elonzo Sopp [name] MD    Today's Orders   [phone] - Ofc Vst, Est Level II [SWF-09323]    Disposition:   Return to clinic for Doctor Visit in 3 month(s)

## 2008-11-28 NOTE — Unmapped (Signed)
Signed by Cleone Slim MD on 11/28/2008 at 12:20:55      Reason for Visit   Chief Complaint: sore throat x 3 days    History from: patient    Allergies  No Known Allergies    Medications   ADDERALL (10MG )  TAB (AMPHETAMINE-DEXTROAMPHETAMINE) 1 by mouth every afternoon uad  ALBUTEROL SULFATE HFA 108 MCG/ACT AERS (ALBUTEROL SULFATE) two puffs every four hours as needed  ADDERALL XR 15 MG CP24 (AMPHETAMINE-DEXTROAMPHETAMINE) 3 tablets by mouth daily    Vital Signs:   Wt: 128 lbs.      BMI: 20.73  BSA: 1.66  Wt chg (lbs): 6  Temperature: 98.1   oral  Pulse: 75 (regular)  BP: 116/72  Cuff size: regular    History of Present Illness   Chief Complaint: sore throat  38 yo WF w/ sore throat x 3 days. Son had strep. Admits H/A, congestion, ears pop, denies cough. Throat feels sore and raw, not tingling. Has not tried any tx yet. Had flu shot. Denies myalgias. Admits fatigue. Denies chest pain or shortness of breath.    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      Physical Examination:   BP: 116/  72    Physical Exam- Detail:   General Appearance: well-developed, well-nourished and in no acute distress.  Ears: Right TM pink, translucent, non-bulging. Left TM pink, difficult to distinguish landmarks 2nd to cerumen obstruction.  Nose/Face: Nares patent. No facial TTP. Face symmetric.  Oropharynx: Slightly erythematous, no exudate or tonsillar enlargemtn. 5 mm polyp right of uvula.  Oral Cavity: Good dentition, pink mucosa.  Lymphatic: RIght and left submandib adenopathy, no cervical or post auric adenopathy.  Psychiatric: Judgement and  insight are within normal limits.  Alert and oriented x3.  No mood disorders noted, appropriate affect.           New Medications:  WELLBUTRIN XL 150 MG XR24H-TAB (BUPROPION HCL) 1 by mouth daily  ZITHROMAX Z-PAK TABS (AZITHROMYCIN TABS) Use as directed.  PREDNISONE 20 MG TABS (PREDNISONE) one by mouth twice daily X 3 days, then one by mouth daily X 4 days      Preventive Maintenance   Pap Smear Due: adv  Lipid Panel Due: adv            Prescriptions:  WELLBUTRIN XL 150 MG XR24H-TAB (BUPROPION HCL) 1 by mouth daily  #30 x 5   Entered and Authorized by: Margaux Engen [name] MD   Signed by: Cleone Slim MD on 11/28/2008   Method used: Print then Give to Patient   RxID: [id number]  PREDNISONE 20 MG TABS (PREDNISONE) one by mouth twice daily X 3 days, then one by mouth daily X 4 days  #10 x 0   Entered and Authorized by: Eniya Cannady [name] MD   Signed by: Cleone Slim MD on 11/28/2008   Method used: Print then Give to Patient   RxID: [id number]  ZITHROMAX Z-PAK TABS (AZITHROMYCIN TABS) Use as directed.  #1 x 0   Entered and Authorized by: Antionio Negron [name] MD   Signed by: Cleone Slim MD on 11/28/2008  Method used: Print then Give to Patient   RxID: [id number]      Assessment and Plan   [phone]    1. pharyngitis: see below  2. asthma: stable; if symptoms worsen start pred; flushot utd; shd get pneumococcal shot later  3. depn: resume wellbutrin    Medications   New Prescriptions/Refills:  WELLBUTRIN XL 150 MG XR24H-TAB (BUPROPION HCL) 1 by mouth daily  #30 x 5, 11/28/2008, Mahmud Keithly K Konnor Vondrasek MD  PREDNISONE 20 MG TABS (PREDNISONE) one by mouth twice daily X 3 days, then one by mouth daily X 4 days  #10 x 0, 11/28/2008, Ariza Evans [name] MD  ZITHROMAX Z-PAK TABS (AZITHROMYCIN TABS) Use as directed.  #1 x 0, 11/28/2008, Kinzleigh Kandler [name] MD    Today's Orders   [phone] - Ofc Vst, Est Level IV [CPT-99214]  Strep I.D (Office) [CPT-87880]    Patient Instructions   Do salt water gargles 3-4 times per day for your sore throat  safety  net antibiotics: fill only if your symptoms progress over next 3-5 days.   start prednisone if your peak flows start to drop    Disposition:   as scheduled   Appointment Reason: adderall refills are due in 6 weeks

## 2008-11-28 NOTE — Unmapped (Signed)
Signed by Cleone Slim MD on 11/28/2008 at 12:15:27      Work Excuse   This patient was seen in the office 11/28/2008.  May not return to work until 12/02/2008.

## 2009-01-17 NOTE — Unmapped (Signed)
Signed by Blair Hailey on 01/17/2009 at 16:24:15    Phone Note   Patient Call    [facility] Cell Phone #: [phone]  Follow-up for Phone Call   ok  pls don't call for refills of this medicine in the future: it is a restricted med & the medical board frowns on phone in refills...when its time for a refill its time to be seen   Follow-up by: Cleone Slim MD,  January 17, 2009 4:08 PM      Additional Follow-up for Phone Call   lmom w/pt that scrip is ready for pick up in office.  Additional Follow-up by: Blair Hailey,  January 17, 2009 4:24 PM  Prescriptions:  ADDERALL XR 15 MG CP24 (AMPHETAMINE-DEXTROAMPHETAMINE) 3 tablets by mouth daily  #90 x 0   Entered by: Boykin Nearing   Authorized by: Cypher Paule [name] MD   Signed by: Cleone Slim MD on 01/17/2009   Method used: Print then Give to Patient   RxID: [id number]    LOV 11/28/08  Last refill 10/07/08    Summary of Call:  Need written prescription today of ADDERALL XR 15 MG CP24 (AMPHETAMINE-DEXTROAMPHETAMINE) 3 tablets by mouth daily  Patient needs today.        Initial call taken by: Delight Hoh,  January 17, 2009 12:07 PM

## 2009-02-20 NOTE — Unmapped (Signed)
Signed by Peggye Fothergill MD on 02/22/2009 at 13:34:14      Reason for Visit   Chief Complaint: ADD    History from: patient    Allergies  No Known Allergies    Medications   ADDERALL (10MG )  TAB (AMPHETAMINE-DEXTROAMPHETAMINE) 1 by mouth every afternoon uad  ALBUTEROL SULFATE HFA 108 MCG/ACT AERS (ALBUTEROL SULFATE) two puffs every four hours as needed  ADDERALL XR 15 MG CP24 (AMPHETAMINE-DEXTROAMPHETAMINE) 3 tablets by mouth daily  WELLBUTRIN XL 150 MG XR24H-TAB (BUPROPION HCL) 1 by mouth daily       Vital Signs:   Wt: 128 lbs.      BMI: 20.73  BSA: 1.66  Wt chg (lbs): 0  Temperature: 98.1  degrees F  oral    Patient appears to be in acute distress: no  BP: 118/76    Intake recorded by: Boykin Nearing on Feb 20, 2009 11:47 AM    History of Present Illness   c/o chest pain 3  weeks ago  pressure radiated to L arm ,was  sittnig in dentist waiting room   symptoms  lasted  for 2-3 min  no sweating or  dizziness   apeptite ok  scale 1-10  dull  2/10  similar episode 1 week ago  no h/o HLP low cardiac  risk profile    also her e for refill of  adderall which helps her a dd symptoms.    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      Physical Examination:   BP: 118/  76    Physical Exam- Detail:   General Appearance: well-developed, well-nourished  and in no acute distress.  Skin: No suspicious rashes or lesions.  Respiratory: Respiration un-labored.  Lung fields clear to auscultation.  No wheezing, rales, rhonchi or pleural rub.  Neck: No thyromegaly.  No nodules, masses or tenderness.  Lymphatic: Areas palpated not enlarged:  cervical, supraclavicular.  Breast: no chest wall tenderness to palpation  Cardiac: S1 and S2 normal.  RRR without murmurs, rubs, gallops.  No JVD.  Vascular: No carotid bruits.  No edema or varicosities.  Abdomen: soft   Neurologic: cranial nervbes 2-12 intact ,no focal defcit  Psychiatric: Judgement and insight are within normal limits.  Alert and oriented x3.  No mood disorders noted, appropriate affect.  Musculoskeletal: Gait coordinated and smooth.  Digits are without clubbing or cyanosis.           New Problems:  CHEST PAIN (ICD-786.50)  New Medications:  ADVAIR DISKUS 500-50 MCG/DOSE MISC (FLUTICASONE-SALMETEROL) 1  inhalation bid      Preventive Maintenance   Pap Smear Due: adv  Lipid Panel Due: adv            Prescriptions:  ADVAIR DISKUS 500-50 MCG/DOSE MISC (FLUTICASONE-SALMETEROL) 1  inhalation bid  #1  x 3   Entered and Authorized by: Peggye Fothergill MD   Signed by: Peggye Fothergill MD on 02/20/2009   Method used: Print then Give to Patient   RxID: [id number]  ADDERALL XR 15 MG CP24 (AMPHETAMINE-DEXTROAMPHETAMINE) 3 tablets by mouth daily  #90 x 0   Entered and Authorized by: Peggye Fothergill MD   Signed by: Peggye Fothergill MD on 02/20/2009   Method used: Print then Give to Patient   RxID: [id number]  ADDERALL (10MG )  TAB (AMPHETAMINE-DEXTROAMPHETAMINE) 1 by mouth every afternoon uad  #30 x 0   Entered and Authorized by: Peggye Fothergill MD   Signed by: Peggye Fothergill MD on 02/20/2009   Method used: Print then Give to Patient   RxID: [id number]  ADDERALL XR 15 MG CP24 (AMPHETAMINE-DEXTROAMPHETAMINE) 3 tablets by mouth daily  #90 x 0   Entered and Authorized by: Peggye Fothergill MD   Signed by: Peggye Fothergill MD on 02/20/2009   Method used: Print  then Give to Patient   RxID: [id number]  ADDERALL (10MG )  TAB (AMPHETAMINE-DEXTROAMPHETAMINE) 1 by mouth every afternoon uad  #30 x 0   Entered and Authorized by: Peggye Fothergill MD   Signed by: Peggye Fothergill MD on 02/20/2009   Method used: Print then Give to Patient   RxID: [id number]  ADDERALL (10MG )  TAB (AMPHETAMINE-DEXTROAMPHETAMINE) 1 by mouth every afternoon uad  #30 x 0   Entered and Authorized by: Peggye Fothergill MD   Signed by: Peggye Fothergill MD on 02/20/2009   Method used: Print then Give to Patient   RxID: [id number]  ADDERALL XR 15 MG CP24 (AMPHETAMINE-DEXTROAMPHETAMINE) 3 tablets by mouth daily  #90 x 0   Entered and Authorized by: Peggye Fothergill MD   Signed by: Peggye Fothergill MD on 02/20/2009   Method used: Print then Give to Patient   RxID: [id number]      Assessment and Plan   Chest pain  stress test ordered  ekg here nsr    add refileld meds but advsied to hold a dderall until the stress test was done and pt  cleared .   h/o asthma refilled advait   total time spent with pt 25 min of which more than 50% in ftfc/cc    Problems New Problems:  Dx of CHEST PAIN (ICD-786.50)  Onset: 02/20/2009    Medications   New Prescriptions/Refills:  ADVAIR DISKUS 500-50 MCG/DOSE MISC (FLUTICASONE-SALMETEROL) 1  inhalation bid  #1 x 3, 02/20/2009, Peggye Fothergill MD  ADDERALL XR 15 MG CP24 (AMPHETAMINE-DEXTROAMPHETAMINE) 3 tablets by mouth daily  #90 x 0, 02/20/2009, Peggye Fothergill MD  ADDERALL (10MG )  TAB (AMPHETAMINE-DEXTROAMPHETAMINE) 1 by mouth every afternoon uad  #30 x 0, 02/20/2009, Peggye Fothergill MD  ADDERALL XR 15 MG CP24 (AMPHETAMINE-DEXTROAMPHETAMINE) 3 tablets by mouth daily  #90 x 0, 02/20/2009, Peggye Fothergill MD  ADDERALL (10MG )  TAB (AMPHETAMINE-DEXTROAMPHETAMINE) 1 by mouth every afternoon uad  #30 x 0, 02/20/2009, Peggye Fothergill MD  ADDERALL (10MG )  TAB (AMPHETAMINE-DEXTROAMPHETAMINE) 1 by mouth every afternoon uad  #30 x 0, 02/20/2009, Peggye Fothergill MD  ADDERALL XR 15 MG CP24 (AMPHETAMINE-DEXTROAMPHETAMINE) 3 tablets by  mouth daily  #90 x 0, 02/20/2009, Peggye Fothergill MD    Today's Orders   [facility] - Nuclear - Myoview/Cardiolite [CPT-78452]  EKG, tracing with report [CPT-93000]  99214 - Ofc Vst, Est Level IV [[id number]]

## 2009-02-28 NOTE — Unmapped (Signed)
Signed by Cleone Slim MD on 02/28/2009 at 00:00:00  EKG      Imported By: Scharlene Corn 03/12/2009 16:46:39    _____________________________________________________________________    External Attachment:    Please see [facility] EMR for this document.

## 2009-03-25 NOTE — Unmapped (Signed)
Signed by Peggye Fothergill MD on 03/30/2009 at 22:23:10      Reason for Visit   Chief Complaint: Here for medication refills    History from: patient    Allergies  No Known Allergies    Medications   ADDERALL (10MG )  TAB (AMPHETAMINE-DEXTROAMPHETAMINE) 1 by mouth every afternoon uad  ALBUTEROL SULFATE HFA 108 MCG/ACT AERS (ALBUTEROL SULFATE) two puffs every four hours as needed  ADDERALL XR 15 MG CP24 (AMPHETAMINE-DEXTROAMPHETAMINE) 3 tablets by mouth daily  WELLBUTRIN XL 150 MG XR24H-TAB (BUPROPION HCL) 1 by mouth daily  ADVAIR DISKUS 500-50 MCG/DOSE MISC (FLUTICASONE-SALMETEROL) 1  inhalation bid        Vital Signs:   Wt: 127 lbs.      BMI: 20.57  BSA: 1.65  Wt chg (lbs): -1  Temperature: 98.1  Pulse: 78  BP: 102/64    Intake recorded by: Sung Amabile MA on March 25, 2009 12:50 PM  /  History of Present Illness   pt here for refill of her adderall  she states  she is doing well   she has been tolerating the medication well            Physical Examination:   BP: 102/  64    Physical Exam- Detail:   General Appearance: well-developed, well-nourished and in no acute distress.  Eyes: Sclera white, conjunctiva without injection and pallor.  PERRLA.  EOMI  Respiratory: Respiration un-labored.  Lung fields clear to auscultation.  No wheezing, rales, rhonchi or pleural rub.  Cardiac: S1 and S2 normal.  RRR without murmurs, rubs, gallops.  No JVD.  Psychiatric: Judgement and insight are within normal limits.  Alert and oriented x3.  No mood disorders noted, appropriate affect.          Preventive Maintenance   Pap Smear Due: adv  Lipid Panel Due: adv            Prescriptions:  ADDERALL XR 15 MG CP24 (AMPHETAMINE-DEXTROAMPHETAMINE) 3 tablets by mouth daily  #90 x 0   Entered and Authorized by: Peggye Fothergill MD   Signed by: Peggye Fothergill MD on 03/25/2009   Method used: Print then Give to Patient   RxID: [id number]  ADDERALL (10MG )  TAB (AMPHETAMINE-DEXTROAMPHETAMINE) 1 by mouth every afternoon uad  #30 x 0   Entered and  Authorized by: Peggye Fothergill MD   Signed by: Peggye Fothergill MD on 03/25/2009   Method used: Print then Give to Patient   RxID: [id number]  ADDERALL (10MG )  TAB (AMPHETAMINE-DEXTROAMPHETAMINE) 1 by mouth every afternoon uad  #30 x 0   Entered by: Peggye Fothergill MD   Authorized by: Cleone Slim MD   Signed by: Peggye Fothergill MD on 03/25/2009   Method used: Print then Give to Patient   RxID: [id number]  ADDERALL XR 15 MG CP24 (AMPHETAMINE-DEXTROAMPHETAMINE) 3 tablets by mouth daily  #90 x 0   Entered by: Peggye Fothergill MD   Authorized by: Manoj [name] MD   Signed by: Peggye Fothergill MD on 03/25/2009   Method used: Print then Give to Patient   RxID: [id number]      Assessment and Plan   Add doing well refilled medication  folow up ion 6 months    Medications   New Prescriptions/Refills:  ADDERALL XR 15 MG CP24 (AMPHETAMINE-DEXTROAMPHETAMINE) 3 tablets by mouth daily  #90 x 0, 03/25/2009, Peggye Fothergill MD  ADDERALL (10MG )  TAB (AMPHETAMINE-DEXTROAMPHETAMINE) 1 by mouth every afternoon uad  #30 x 0, 03/25/2009, [place]  MD  ADDERALL (10MG )  TAB (AMPHETAMINE-DEXTROAMPHETAMINE) 1 by mouth every afternoon uad  #30 x 0, 03/25/2009, Peggye Fothergill MD  ADDERALL XR 15 MG CP24 (AMPHETAMINE-DEXTROAMPHETAMINE) 3 tablets by mouth daily  #90 x 0, 03/25/2009, Peggye Fothergill MD    Today's Orders   [phone] - Ofc Vst, Est Level III [[id number]]

## 2009-04-25 NOTE — Unmapped (Signed)
Signed by Peggye Fothergill MD on 04/25/2009 at 10:46:29    Phone Note   Patient Call       Calling Instructions: [phone] AnytimePrescriptions:  ADDERALL XR 15 MG CP24 (AMPHETAMINE-DEXTROAMPHETAMINE) 3 tablets by mouth daily  #90 x 0   Entered by: Boykin Nearing   Authorized by: Peggye Fothergill MD   Signed by: Peggye Fothergill MD on 04/25/2009   Method used: Print then Give to Patient   RxID: [id number]  ADDERALL (10MG )  TAB (AMPHETAMINE-DEXTROAMPHETAMINE) 1 by mouth every afternoon uad  #30 x 0   Entered by: Boykin Nearing   Authorized by: Peggye Fothergill MD   Signed by: Peggye Fothergill MD on 04/25/2009   Method used: Print then Give to Patient   RxID: [id number]        Summary of Call:  Needs refill on:  ADDERALL (10MG )  TAB (AMPHETAMINE-DEXTROAMPHETAMINE) 1 by mouth every afternoon Use as directed.   ADDERALL XR 15 MG CP24 (AMPHETAMINE-DEXTROAMPHETAMINE) 3 tablets by mouth daily  Patient is out of medication.  Patient will pickup.     Initial call taken by: Delight Hoh,  April 25, 2009 10:34 AM

## 2009-04-25 NOTE — Unmapped (Signed)
Signed by Blair Hailey on 04/25/2009 at 12:45:44    Prescriptions:  ADDERALL XR 15 MG CP24 (AMPHETAMINE-DEXTROAMPHETAMINE) 3 tablets by mouth daily  #90 x 0   Entered by: Blair Hailey   Authorized by: Peggye Fothergill MD   Signed by: Blair Hailey on 04/25/2009   Method used: Reprint   RxID: [id number]  ADDERALL (10MG )  TAB (AMPHETAMINE-DEXTROAMPHETAMINE) 1 by mouth every afternoon uad  #30 x 0   Entered by: Blair Hailey   Authorized by: Peggye Fothergill MD   Signed by: Blair Hailey on 04/25/2009   Method used: Reprint   RxID: [id number]    Scrips reprinted.  Notified pt that scrips ready to pick up and should bring ID (lmom.)  ..................................................................Marland KitchenBlair Hailey  April 25, 2009 12:44 PM

## 2009-05-27 NOTE — Unmapped (Addendum)
Signed by Peggye Fothergill MD on 05/27/2009 at 11:33:12    Phone Note   Patient Call       Calling Instructions: [phone]:  ADDERALL (10MG )  TAB (AMPHETAMINE-DEXTROAMPHETAMINE) 1 by mouth every afternoon uad  #30 x 0   Entered and Authorized by: Peggye Fothergill MD   Signed by: Peggye Fothergill MD on 05/27/2009   Method used: Print then Give to Patient   RxID: [id number]  ADDERALL XR 15 MG CP24 (AMPHETAMINE-DEXTROAMPHETAMINE) 3 tablets by mouth daily  #90 x 0   Entered and Authorized by: Peggye Fothergill MD   Signed by: Peggye Fothergill MD on 05/27/2009   Method used: Print then Give to Patient   RxID: [id number]        Summary of Call:  Needs refill on:  ADDERALL (10MG )  TAB (AMPHETAMINE-DEXTROAMPHETAMINE) 1 by mouth every afternoon uad  ADDERALL XR 15 MG CP24 (AMPHETAMINE-DEXTROAMPHETAMINE) 3 tablets by mouth daily  Please call patient when ready for pick up.     Initial call taken by: Delight Hoh,  May 27, 2009 11:18 AM      Follow-up for Phone Call   LOV 03/25/09  LL 08/24/07  LR 04/25/09  Follow-up by: Blair Hailey,  May 27, 2009 11:21 AM    Signed by Blair Hailey on 05/27/2009 at 12:10:54    Pt informed that scrips are ready to be picked up and she should bring ID when doing so/lmom.

## 2009-07-01 NOTE — Unmapped (Signed)
Signed by Cleone Slim MD on 07/01/2009 at 14:52:50      Reason for Visit   Chief Complaint: asthma - can't breathe.  follow up on meds.    History from: patient    Allergies  No Known Allergies    Medications   ADDERALL (10MG )  TAB (AMPHETAMINE-DEXTROAMPHETAMINE) 1 by mouth every afternoon uad  ALBUTEROL SULFATE HFA 108 MCG/ACT AERS (ALBUTEROL SULFATE) two puffs every four hours as needed  ADDERALL XR 15 MG CP24 (AMPHETAMINE-DEXTROAMPHETAMINE) 3 tablets by mouth daily  ADVAIR DISKUS 500-50 MCG/DOSE MISC (FLUTICASONE-SALMETEROL) 1  inhalation bid       Vital Signs:   Ht: 66 in.  Wt: 134.5 lbs.      BMI: 21.79  BSA: 1.69  Wt chg (lbs): 7.50  Temperature: 98.0  degrees F  oral  Pulse: 77  BP: 112/80    Pulse Oximetry:   O2 Saturation: 100 % w/ room air   Intake recorded by: Blair Hailey on July 01, 2009 2:37 PM    Peak Flow   Peak Flow: 380 L/min.        HPI Multiple Chronic   Condition(s): Asthma.  Additional Dx: ADD    Current Status:   Compliance with tx: fair  Medication Tolerance: good  Compliance Comments:   ran out of advair recently; resumed singulair yesterday & feels a little better    ROS/Symptoms:   Patient complains of the following respiratory symptoms: recently asthma has been worse (altho a little better today); usu worse in the fall, this season worse than ever  Patient denies the following general symptoms: fever  Patient complains of the following ENT symptoms: sinus comgestion    PAST HISTORY  Past Medical History (reviewed - no changes required):  Environmental Allergies, Asthma, Chronic Back Pain, cervical dysplasia, Anxiety, Depression, ADD  Social History (reviewed - no changes required): Marital Status: married,   Employment Status: employed part-time,   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      Physical Examination:   BP: 112/  80    Physical Exam- Detail:   General  Appearance: well-developed, well-nourished and in no acute distress.  Ears: No lesions.  Tympanic membranes translucent, non-bulging.  Canal walls pink, without discharge.  Hearing grossly intact.  Nose/Face: no sinus ttp  Oropharynx: mild pharyngitis w/o exudate   Respiratory: Respiration un-labored.  Lung fields clear to auscultation.  No wheezing, rales, rhonchi or pleural rub.  Neck: No thyromegaly.  No nodules, masses or tenderness.  Lymphatic: Areas palpated not enlarged:  cervical, supraclavicular.  Psychiatric: Judgement and insight are within normal limits.  Alert and oriented x3.  No mood disorders noted, appropriate affect.           New Medications:  PREDNISONE 20 MG TABS (PREDNISONE) one by mouth twice daily X 3 days, then one by mouth daily X 4 days  SINGULAIR 10 MG TAB (MONTELUKAST SODIUM) one by mouth at bedtime      Preventive Maintenance   Pap Smear Due: adv  Smoking Status: non-smoker  Lipid Panel Due: adv            Prescriptions:  ADDERALL XR 15 MG CP24 (AMPHETAMINE-DEXTROAMPHETAMINE) 3 tablets by mouth daily  #90 x 0   Entered and Authorized by: Holle Sprick [name] MD   Signed by: Cleone Slim MD on 07/01/2009   Method used: Print then  Give to Patient   RxID: [id number]  ADDERALL (10MG )  TAB (AMPHETAMINE-DEXTROAMPHETAMINE) 1 by mouth every afternoon uad  #30 x 0   Entered and Authorized by: Lilienne Weins [name] MD   Signed by: Cleone Slim MD on 07/01/2009   Method used: Print then Give to Patient   RxID: [id number]  ADDERALL XR 15 MG CP24 (AMPHETAMINE-DEXTROAMPHETAMINE) 3 tablets by mouth daily  #90 x 0   Entered and Authorized by: Prajwal Fellner [name] MD   Signed by: Cleone Slim MD on 07/01/2009   Method used: Print then Give to Patient   RxID: [id number]  ADDERALL (10MG )  TAB (AMPHETAMINE-DEXTROAMPHETAMINE) 1 by mouth every afternoon uad  #30 x 0   Entered and Authorized by: Nicki Furlan [name] MD   Signed by: Cleone Slim MD on 07/01/2009   Method used: Print then Give to  Patient   RxID: [id number]  SINGULAIR 10 MG TAB (MONTELUKAST SODIUM) one by mouth at bedtime  #30 x 2   Entered and Authorized by: Roby Spalla [name] MD   Signed by: Cleone Slim MD on 07/01/2009   Method used: Print then Give to Patient   RxID: [id number]  ADVAIR DISKUS 500-50 MCG/DOSE MISC (FLUTICASONE-SALMETEROL) 1  inhalation bid  #1 x 3   Entered and Authorized by: Brydan Downard [name] MD   Signed by: Cleone Slim MD on 07/01/2009   Method used: Print then Give to Patient   RxID: [id number]  ALBUTEROL SULFATE HFA 108 MCG/ACT AERS (ALBUTEROL SULFATE) two puffs every four hours as needed  #3 hfa x 3   Entered and Authorized by: Kalix Meinecke [name] MD   Signed by: Cleone Slim MD on 07/01/2009   Method used: Print then Give to Patient   RxID: [id number]  ADDERALL XR 15 MG CP24 (AMPHETAMINE-DEXTROAMPHETAMINE) 3 tablets by mouth daily  #90 x 0   Entered and Authorized by: Kye Hedden [name] MD   Signed by: Cleone Slim MD on 07/01/2009   Method used: Print then Give to Patient   RxID: [id number]  ADDERALL (10MG )  TAB (AMPHETAMINE-DEXTROAMPHETAMINE) 1 by mouth every afternoon uad  #30 x 0   Entered and Authorized by: Raeli Wiens [name] MD   Signed by: Cleone Slim MD on 07/01/2009   Method used: Print then Give to Patient   RxID: [id number]  PREDNISONE 20 MG TABS (PREDNISONE) one by mouth twice daily X 3 days, then one by mouth daily X 4 days  #10 x 0   Entered and Authorized by: Jalexus Brett [name] MD   Signed by: Cleone Slim MD on 07/01/2009   Method used: Print then Give to Patient   RxID: [id number]      Assessment and Plan   --251    1. exacn v asthma: add predn; refill meds incl singulair; refer to pulm; will get flushot @ work  2. ADD: continue w/ current treatment     Problems   Status of Existing Problems:  Assessed ASTHMA NOS W/O STATUS ASTHMATICUS as deteriorated - Airik Goodlin [name] MD  Assessed ADD as unchanged - Arryanna Holquin [name] MD    Medications   New Prescriptions/Refills:  ADDERALL XR 15 MG  CP24 (AMPHETAMINE-DEXTROAMPHETAMINE) 3 tablets by mouth daily  #90 x 0, 07/01/2009, Ashira Kirsten K Aashka Salomone MD  ADDERALL (10MG )  TAB (AMPHETAMINE-DEXTROAMPHETAMINE) 1 by mouth every afternoon uad  #30 x 0, 07/01/2009, Iain Sawchuk K Shawnn Bouillon MD  ADDERALL XR 15 MG CP24 (AMPHETAMINE-DEXTROAMPHETAMINE) 3 tablets by mouth daily  #  90 x 0, 07/01/2009, Analeigh Aries K Taylin Leder MD  ADDERALL (10MG )  TAB (AMPHETAMINE-DEXTROAMPHETAMINE) 1 by mouth every afternoon uad  #30 x 0, 07/01/2009, Lillian Ballester K Aaric Dolph MD  SINGULAIR 10 MG TAB (MONTELUKAST SODIUM) one by mouth at bedtime  #30 x 2, 07/01/2009, Corian Handley [name] MD  ADVAIR DISKUS 500-50 MCG/DOSE MISC (FLUTICASONE-SALMETEROL) 1  inhalation bid  #1 x 3, 07/01/2009, Derell Bruun K Thedore Mins MD  ALBUTEROL SULFATE HFA 108 MCG/ACT AERS (ALBUTEROL SULFATE) two puffs every four hours as needed  #3 hfa x 3, 07/01/2009, Sayaka Hoeppner K Roxanne Panek MD  ADDERALL XR 15 MG CP24 (AMPHETAMINE-DEXTROAMPHETAMINE) 3 tablets by mouth daily  #90 x 0, 07/01/2009, Kruti Horacek K Avayah Raffety MD  ADDERALL (10MG )  TAB (AMPHETAMINE-DEXTROAMPHETAMINE) 1 by mouth every afternoon uad  #30 x 0, 07/01/2009, Sonji Starkes K Constantina Laseter MD  PREDNISONE 20 MG TABS (PREDNISONE) one by mouth twice daily X 3 days, then one by mouth daily X 4 days  #10 x 0, 07/01/2009, Pancho Rushing [name] MD    Today's Orders   Pulmonary Consult  [UCP-11111]  [id number] - Ofc Vst, Est Level IV [[id number]]    Patient Instructions   1. do make an appointment to see the pulmonary doctor and to get your flushot @ work; Go to ER if you have worsening of your symptoms; if you do go to ER let us know which one and we will fax your chart summary sheet there   2. follow up on 3 months for Adderall refills: please don't call for refills of this medicine: it is a restricted med & the medical board frowns on phone in refills...when its time for a refill its time to be seen     Disposition:   Return to clinic for Doctor Visit in 3 month(s)

## 2009-10-06 NOTE — Unmapped (Signed)
Signed by Sung Amabile MA on 10/06/2009 at 13:42:10    Phone Note   Patient Call    Caller: patient  Department: Family Medicine  Call for: Mary [name]    Follow-up for Phone Call   ok: rx printed  make a fu appt for 3-4 weeks  Follow-up by: Cleone Slim MD,  October 06, 2009 12:40 PM      Additional Follow-up for Phone Call   Pt informed scripts ready to be picked up, needs to schedule a f/u appt in 3-4 weeks.  Additional Follow-up by: Sung Amabile MA,  October 06, 2009 1:42 PM  Prescriptions:  ADDERALL XR 15 MG CP24 (AMPHETAMINE-DEXTROAMPHETAMINE) 3 tablets by mouth daily  #90 x 0   Entered and Authorized by: Raygen Dahm [name] MD   Signed by: Cleone Slim MD on 10/06/2009   Method used: Print then Give to Patient   RxID: [id number]  ADDERALL (10MG )  TAB (AMPHETAMINE-DEXTROAMPHETAMINE) 1 by mouth every afternoon uad  #30 x 0   Entered and Authorized by: Ruben Mahler [name] MD   Signed by: Cleone Slim MD on 10/06/2009   Method used: Print then Give to Patient   RxID: [id number]      Reason for Call: speak with nurse    Summary of Call:  pt has an appt next week however needs a script for adderall 15mg  1 by mouth three times daily, adderall xr 10mg  take as needed.  *please call when script is ready as pt has now been out of medication for 3 days now and is having fatigue.       Initial call taken by: Schuyler Amor,  October 06, 2009 11:11 AM      Follow-up for Phone Call   lov 07/01/09 (next appt 10/13/09)  lr 10mg  9/28 #30 x 0  lr xr 9/28 #90 x 0

## 2009-10-28 LAB — ANA SCREEN W/REL TITER: ANA Screen, IFA: NEGATIVE

## 2009-10-28 LAB — CBC
Hematocrit: 35.9 % (ref 35.0–45.0)
Hemoglobin: 11.9 g/dL (ref 11.7–15.5)
MCH: 29.5 pg (ref 27.0–33.0)
MCHC: 33.3 g/dL (ref 32.0–36.0)
MCV: 88.4 fL (ref 80.0–100.0)
Platelets: 200 10*3/uL (ref 140–400)
RBC: 4.06 10*6/uL (ref 3.80–5.10)
RDW: 12.7 % (ref 11.0–15.0)
WBC: 5.5 10*3/uL (ref 3.8–10.8)

## 2009-10-28 LAB — TSH: TSH: 1.84 microintl units/mL

## 2009-10-28 NOTE — Unmapped (Signed)
Signed by Cleone Slim MD on 10/29/2009 at 09:45:49  Patient: Mary [name]  Note: All result statuses are Final unless otherwise noted.    Tests: (1) TSH, 3RD GENERATION W/REFLEX TO FT4 ([id number])   TSH, 3RD GENERATION W/REFLEX TO FT4                              1.84 mIU/L      Reference Range              > or = 20 Years  0.40-4.50                   Pregnancy Ranges       First trimester    0.20-4.70       Second trimester   0.30-4.10       Third trimester    0.40-2.70    Note: An exclamation mark (!) indicates a result that was not dispersed into   the flowsheet.  Document Creation Date: 10/29/2009 12:33 AM  _______________________________________________________________________    (1) Order result status: Final  Collection or observation date-time: 10/28/2009 14:18  Requested date-time:   Receipt date-time: 10/28/2009 14:26  Reported date-time: 10/29/2009 00:00  Referring Physician:    Ordering Physician: Melissa Montane [facility])  Specimen Source: S  Source: [name] Order Number: [id number]  Lab site: [name] [place]      [address]      [place]  [place]  [id number]

## 2009-10-28 NOTE — Unmapped (Signed)
Signed by Cleone Slim MD on 10/29/2009 at 09:45:49  Patient: Mary [name]  Note: All result statuses are Final unless otherwise noted.    Tests: (1) CBC (H/H, RBC, INDICES, WBC, PLT) (QDL-1759)   WHITE BLOOD CELL COUNT                              5.5 Thousand/uL             3.8-10.8    RED BLOOD CELL COUNT      4.06 Million/uL             3.80-5.10    HEMOGLOBIN                11.9 g/dL                   16.1-09.6    HEMATOCRIT                35.9 %                      35.0-45.0    MCV                       88.4 fL                     80.0-100.0    MCH                       29.5 pg                     27.0-33.0    MCHC                      33.3 g/dL                   04.5-40.9    RDW                       12.7 %                      11.0-15.0    PLATELET COUNT            200 Thousand/uL             140-400    Note: An exclamation mark (!) indicates a result that was not dispersed into   the flowsheet.  Document Creation Date: 10/28/2009 10:02 PM  _______________________________________________________________________    (1) Order result status: Final  Collection or observation date-time: 10/28/2009 14:18  Requested date-time:   Receipt date-time: 10/28/2009 14:26  Reported date-time: 10/28/2009 21:00  Referring Physician:    Ordering Physician: Melissa Montane [facility])  Specimen Source: B  Source: [name] Order Number: [id number]  Lab site: [facility] [place]      [address]      [place]  [facility]  [address]      -----------------    The following lab values were dispersed to the flowsheet  with no units conversion:      WHITE BLOOD CELL COUNT, 5.5 THOUSAND/UL, (F)  expected units: 10*3/mm3    RED BLOOD CELL COUNT, 4.06 MILLION/UL, (F)  expected units: 10*6/mm3    PLATELET COUNT, 200 THOUSAND/UL, (F)  expected units: 10*3/mm3

## 2009-10-28 NOTE — Unmapped (Signed)
Signed by Cleone Slim MD on 10/28/2009 at 14:21:38      Reason for Visit   Chief Complaint: fatigue      Allergies  No Known Allergies    Medications   Current Meds:   ADDERALL (10MG )  TAB (AMPHETAMINE-DEXTROAMPHETAMINE) 1 by mouth every afternoon uad  ALBUTEROL SULFATE HFA 108 MCG/ACT AERS (ALBUTEROL SULFATE) two puffs every four hours as needed  ADDERALL XR 15 MG CP24 (AMPHETAMINE-DEXTROAMPHETAMINE) 3 tablets by mouth daily  ADVAIR DISKUS 500-50 MCG/DOSE MISC (FLUTICASONE-SALMETEROL) 1  inhalation bid  SINGULAIR 10 MG TAB (MONTELUKAST SODIUM) one by mouth at bedtime       Vital Signs:   Wt: 131 lbs.      BMI: 21.22  BSA: 1.67  Wt chg (lbs): -3.50  Temperature: 98.8  degrees F  oral  Pulse: 82  BP: 118/80    Pulse Oximetry:   O2 Saturation: 99 % w/ room air       HPI Multiple Chronic   Condition(s): Asthma.  Additional Dx: fatigue, ADD fu    Current Status:   Compliance Comments:   sleep- 7-8h/nite, does awaken during the nite: does fall asleep easily; no xs snoring  exercise +  diet- not the best  mood- feeling more depn recenlty...dont want to take medicine for it yet    ROS/Symptoms:   Patient denies any respiratory symptoms.  Patient complains of the following GU symptoms: menes- more heavy, more discomfort  Patient complains of the following general symptoms: fatigue: longstanding problem, > 1 year    PAST HISTORY  Past Medical History (reviewed - no changes required):  Environmental Allergies, Asthma, Chronic Back Pain, cervical dysplasia, Anxiety, Depression, ADD  Family History (reviewed - no changes required): father: htn  mother: cancer (?uterine)  siblings: A&W  children: asthma/ allergy  remote: bone cancer  Social History (reviewed - no changes required): Marital Status: married,   Employment Status: employed part-time,   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      Physical Examination:    BP: 118/  80    Physical Exam- Detail:   General Appearance: well-developed, well-nourished and in no acute distress.  Skin: hands: erythema v fingers; blanh normally and normal cap refill  Eyes: no pallor  Oropharynx: Normal appearance.  No erythema, exudate or mass. No tonsillar swelling.  Respiratory: Respiration un-labored.  Lung fields clear to auscultation.  No wheezing, rales, rhonchi or pleural rub.  Neck: No thyromegaly.  No nodules, masses or tenderness.  Lymphatic: Areas palpated not enlarged:  cervical, supraclavicular.  Cardiac: S1 and S2 normal.  RRR without murmurs, rubs, gallops.   Vascular: no edema  Abdomen: No masses or tenderness. Bowel sounds active x4 quad.  Liver and spleen are without tenderness or enlargement.   Psychiatric: Judgement and insight are within normal limits.  Alert and oriented x3.  No mood disorders noted, appropriate affect.          Preventive Maintenance   Pap Smear Due: adv  Lipid Panel Due: adv            Prescriptions:  ADDERALL XR 15 MG CP24 (AMPHETAMINE-DEXTROAMPHETAMINE) 3 tablets by mouth daily  #90 x 0   Entered and Authorized by: Oluwatobi Visser [name] MD   Signed by: Cleone Slim MD on 10/28/2009   Method used: Print then Give to Patient   RxID: [id number]  ADDERALL (10MG )  TAB (AMPHETAMINE-DEXTROAMPHETAMINE) 1 by mouth every afternoon uad  #30 x 0   Entered and Authorized by: Jaheim Canino [name] MD   Signed by: Cleone Slim MD on 10/28/2009   Method used: Print then Give to Patient   RxID: [id number]  ADDERALL XR 15 MG CP24 (AMPHETAMINE-DEXTROAMPHETAMINE) 3 tablets by mouth daily  #90 x 0   Entered and Authorized by: Tyray Proch [name] MD   Signed by: Cleone Slim MD on 10/28/2009   Method used: Print then Give to Patient   RxID: [id number]  ADDERALL (10MG )  TAB (AMPHETAMINE-DEXTROAMPHETAMINE) 1 by mouth every afternoon uad  #30 x 0   Entered and Authorized by: Alphonso Gregson [name] MD   Signed by: Cleone Slim MD on 10/28/2009   Method used: Print then Give to  Patient   RxID: [id number]  SINGULAIR 10 MG TAB (MONTELUKAST SODIUM) one by mouth at bedtime  #90 x 3   Entered and Authorized by: Lititia Sen [name] MD   Signed by: Cleone Slim MD on 10/28/2009   Method used: Print then Give to Patient   RxID: [id number]  ADVAIR DISKUS 500-50 MCG/DOSE MISC (FLUTICASONE-SALMETEROL) 1  inhalation bid  #3 x 3   Entered and Authorized by: Brittinie Wherley [name] MD   Signed by: Cleone Slim MD on 10/28/2009   Method used: Print then Give to Patient   RxID: [id number]  ALBUTEROL SULFATE HFA 108 MCG/ACT AERS (ALBUTEROL SULFATE) two puffs every four hours as needed  #3 hfa x 3   Entered and Authorized by: Shiquan Mathieu [name] MD   Signed by: Cleone Slim MD on 10/28/2009   Method used: Print then Give to Patient   RxID: [id number]  ADDERALL XR 15 MG CP24 (AMPHETAMINE-DEXTROAMPHETAMINE) 3 tablets by mouth daily  #90 x 0   Entered and Authorized by: Cristen Bredeson [name] MD   Signed by: Cleone Slim MD on 10/28/2009   Method used: Print then Give to Patient   RxID: [id number]  ADDERALL (10MG )  TAB (AMPHETAMINE-DEXTROAMPHETAMINE) 1 by mouth every afternoon uad  #30 x 0   Entered and Authorized by: Kollen Armenti [name] MD   Signed by: Cleone Slim MD on 10/28/2009   Method used: Print then Give to Patient   RxID: [id number]      Assessment and Plan   155-215    1. fatigue- ck labs; optimize nutn; may b r/t depn, continue to monitor symptoms   2. asthma- continue w/ current treatment  3. aDD: continue w/ current treatment     Medications   New Prescriptions/Refills:  ADDERALL XR 15 MG CP24 (AMPHETAMINE-DEXTROAMPHETAMINE) 3 tablets by mouth daily  #90 x 0, 10/28/2009, Tyree Vandruff K Castle Lamons MD  ADDERALL (10MG )  TAB (AMPHETAMINE-DEXTROAMPHETAMINE) 1 by mouth every afternoon uad  #30 x 0, 10/28/2009, Naheem Mosco K Bernardino Dowell MD  ADDERALL XR 15 MG CP24 (AMPHETAMINE-DEXTROAMPHETAMINE) 3 tablets by mouth daily  #90 x 0, 10/28/2009, Seth Higginbotham K Kaylany Tesoriero MD  ADDERALL (10MG )  TAB (AMPHETAMINE-DEXTROAMPHETAMINE) 1 by mouth  every afternoon uad  #30 x 0, 10/28/2009, Donovyn Guidice K Trine Fread MD  SINGULAIR 10 MG TAB (MONTELUKAST SODIUM) one by mouth at bedtime  #90 x 3, 10/28/2009, Britten Seyfried K Ajmal Kathan MD  ADVAIR DISKUS 500-50 MCG/DOSE MISC (FLUTICASONE-SALMETEROL) 1  inhalation bid  #3 x 3, 10/28/2009, Claudell Rhody K Ronnika Collett MD  ALBUTEROL SULFATE HFA 108 MCG/ACT AERS (ALBUTEROL SULFATE) two puffs every four hours as needed  #3 hfa x 3, 10/28/2009, Masayuki Sakai [name] MD  ADDERALL XR 15 MG CP24 (AMPHETAMINE-DEXTROAMPHETAMINE) 3 tablets by mouth daily  #90 x 0, 10/28/2009, Maleeka Sabatino K Shanteria Laye MD  ADDERALL (10MG )  TAB (AMPHETAMINE-DEXTROAMPHETAMINE) 1 by mouth every afternoon uad  #30 x 0, 10/28/2009, Mariaha Ellington [name] MD    Today's Orders   CBC without Diff   (CBC) (1759) [CPT-85027]  TSH + FT4 reflex    (TSHRFX) (54098)  [CPT-84439]  ANA     (ANA) (249) [*CPT-86038]  99214 - Ofc Vst, Est Level IV [JXB-14782]    Patient Instructions   do try to increase your fruits/vegetables  continue your regular exercise  do let us know if the depression symptoms are getting worse    Disposition:   Return to clinic for Doctor Visit in 3 month(s)

## 2009-10-28 NOTE — Unmapped (Signed)
Signed by Cleone Slim MD on 10/29/2009 at 12:02:17  Patient: Mary [name]  Note: All result statuses are Final unless otherwise noted.    Tests: (1) ANA IFA SCREEN W/REFL TO TITER AND PATTERN, IFA (QDL-249)    ANA SCREEN, IFA           NEGATIVE                    NEGATIVE    Note: An exclamation mark (!) indicates a result that was not dispersed into   the flowsheet.  Document Creation Date: 10/29/2009 11:51 AM  _______________________________________________________________________    (1) Order result status: Final  Collection or observation date-time: 10/28/2009 14:18  Requested date-time:   Receipt date-time: 10/28/2009 14:26  Reported date-time: 10/29/2009 11:00  Referring Physician:    Ordering Physician: Melissa Montane [facility])  Specimen Source: S  Source: [name] Order Number: [id number]  Lab site: [name] [place]      [address]      [place]  [place]  [address]

## 2009-10-29 NOTE — Unmapped (Addendum)
Signed by Cleone Slim MD on 10/29/2009 at 12:05:11                   [facility]         [facility]         [address], [address]         [place], [place] [address]         p [phone] f [phone]                 October 29, 2009      [facility]  [address]    [place], [place] [address]                                                                                           RE:  TEST RESULTS   STRETCH Pettibone--Jul 01, 1971)         Dear Ms. Strange:      The following is an interpretation of your most recent tests.  Please take note of any instructions provided.   Thyroid studies:  Normal        TSH: 1.84      CBC results (Blood Counts):  Normal        Other lab results: ANA (screen for Lupus): negative       Sincerely,        Ethelbert Thain [name] MD            Signed by Tana Coast MA on 10/29/2009 at 12:17:10    MAILED

## 2010-01-30 NOTE — Unmapped (Signed)
Signed by Peggye Fothergill MD on 01/31/2010 at 11:14:10      Reason for Visit   Chief Complaint: c/o head, chest and nasal congestion, drainage, headaches    History from: patient    Allergies  No Known Allergies    Medications   Current Meds:   ADDERALL (10MG )  TAB (AMPHETAMINE-DEXTROAMPHETAMINE) 1 by mouth every afternoon uad  ALBUTEROL SULFATE HFA 108 MCG/ACT AERS (ALBUTEROL SULFATE) two puffs every four hours as needed  ADDERALL XR 15 MG CP24 (AMPHETAMINE-DEXTROAMPHETAMINE) 3 tablets by mouth daily  ADVAIR DISKUS 500-50 MCG/DOSE MISC (FLUTICASONE-SALMETEROL) 1  inhalation bid  SINGULAIR 10 MG TAB (MONTELUKAST SODIUM) one by mouth at bedtime       Vital Signs:   Wt: 136 lbs.      BMI: 22.03  BSA: 1.70  Wt chg (lbs): 5  Temperature: 98.1  degrees F  oral  Pulse: 89  BP: 120/84  Cuff size: regular    Pulse Oximetry:   O2 Saturation: 98 % w/ room air   Intake recorded by: Tana Coast MA on January 30, 2010 12:10 PM    History of Present Illness   since  saturday scratchy in her chest   symptoms  worsened progressively to congestion cough    has  greenish mucus   no feve r   has not been ab,e to get out of bed for the whole week  has sinus pressure nasalcongestion  no assoc nausea or vomitign        PAST HISTORY  Past Medical History (reviewed - no changes required):  Environmental Allergies, Asthma, Chronic Back Pain, cervical dysplasia, Anxiety, Depression, ADD  Surgical History (reviewed - no changes required):  conization 1991    Family History (reviewed - no changes required): father: htn  mother: cancer (?uterine)  siblings: A&W  children: asthma/ allergy  remote: bone cancer  Social History (reviewed - no changes required): Marital Status: married,   Employment Status: employed part-time,   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      Physical Examination:   BP: 120/  84    Physical Exam- Detail:   General  Appearance: well-developed, well-nourished and in no acute distress.  Skin: No suspicious rashes or lesions.  Eyes: Sclera white, conjunctiva without injection and pallor.  PERRLA.  EOMI  Ears: tm normal  Nose/Face: nares boggy with sinus ttp  Oropharynx: Normal appearance.  No erythema, exudate or mass. No tonsillar swelling.  Respiratory: Respiration un-labored.  Lung fields clear to auscultation.  No wheezing, rales, rhonchi or pleural rub.  Neck: No thyromegaly.  No nodules, masses or tenderness.  Lymphatic: Areas palpated not enlarged:  cervical, supraclavicular.  Cardiac: S1 and S2 normal.  RRR without murmurs, rubs, gallops.  No JVD.  Vascular: no edema  Diabetic Foot Check:   Skin integrity intact.  No splitting, ulcers or calluses.  Abdomen: soft non tender  Psychiatric: Judgement and insight are within normal limits.  Alert and oriented x3.  No mood disorders noted, appropriate affect.  Musculoskeletal: Gait coordinated and smooth.  Digits are without clubbing or cyanosis.           New Problems:  URI (ICD-465.9)  New Medications:  ZITHROMAX Z-PAK 250 MG TABS (AZITHROMYCIN) use a s directed      Preventive Maintenance   Pap Smear Due: adv  Lipid Panel Due: adv  Prescriptions:  ALBUTEROL SULFATE HFA 108 MCG/ACT AERS (ALBUTEROL SULFATE) two puffs every four hours as needed  #1 x 0   Entered and Authorized by: Peggye Fothergill MD   Signed by: Peggye Fothergill MD on 01/30/2010   Method used: Print then Give to Patient   RxID: [id number]  AOZHYQMVH Z-PAK 250 MG TABS (AZITHROMYCIN) use a s directed  #1 x 0   Entered and Authorized by: Peggye Fothergill MD   Signed by: Peggye Fothergill MD on 01/30/2010   Method used: Print then Give to Patient   RxID: [id number]      Assessment and Plan   uri with sinus symptoms see below  h/o asthma controlled  see below    Problems New Problems:  Dx of URI (ICD-465.9)  Onset: 01/30/2010    Medications   New Prescriptions/Refills:  ALBUTEROL SULFATE HFA 108 MCG/ACT AERS (ALBUTEROL  SULFATE) two puffs every four hours as needed  #1 x 0, 01/30/2010, Peggye Fothergill MD  ZITHROMAX Z-PAK 250 MG TABS (AZITHROMYCIN) use a s directed  #1 x 0, 01/30/2010, Peggye Fothergill MD    Today's Orders   [phone] - Ofc Vst, Est Level III [OZD-66440]    Patient Instructions   zithromax  fluids zyrtec nettipot    Disposition:   Return to clinic for Nurse Visit                 Preventive MaintenancePap Smear Due: adv  Lipid Panel Due: adv         ]

## 2010-02-04 NOTE — Unmapped (Signed)
Signed by Peggye Fothergill MD on 02/04/2010 at 21:05:04    Phone Note   Patient Call       Calling Instructions: [phone]:  PREDNISONE 20 MG TABS (PREDNISONE) one by mouth twice daily  for 3 days, then one by mouth daily for 4 days.  #13 x 0   Entered by: Tana Coast MA   Authorized by: Peggye Fothergill MD   Signed by: Peggye Fothergill MD on 02/04/2010   Method used: Telephoned to ...     [facility] [address] (retail)     [address]     [place], [place]  [address]     Ph: [phone]     Fax: [phone]   RxID: [id number]      Pharmacy Information:   [facility] [phone]    Summary of Call:  Needs refill on steroid. Patient did not know the name of the medication. Saw patient on Friday. Patient is recovering from asthma.  Please call patient when called in.     Initial call taken by: Delight Hoh,  Feb 04, 2010 2:39 PM      New Medications:  Prescriptions:  PREDNISONE 20 MG TABS (PREDNISONE) one by mouth twice daily  for 3 days, then one by mouth daily for 4 days.  #13 x 0   Entered by: Tana Coast MA   Authorized by: Peggye Fothergill MD   Signed by: Peggye Fothergill MD on 02/04/2010   Method used: Telephoned to ...     [facility] [address] (retail)     [address]     [place], [place]  [address]     Ph: [phone]     Fax: [phone]   RxID: [id number]  PREDNISONE 20 MG TABS (PREDNISONE) one by mouth twice daily  for 3 days, then one by mouth daily for 4 days.    Follow-up for Phone Call   spoke w pt she states she is feeling a little better but sinus infection is affecting her asmtha and would like rx for prednisone called into pharm, pt states that is usually what dr Thedore Mins does for her. called dr August Saucer and spoke w her reguarding pt requests, rx called into pharm authorized by dr August Saucer. pt advised.  Follow-up by: Tana Coast MA,  Feb 04, 2010 2:51 PM

## 2010-02-06 NOTE — Unmapped (Signed)
Signed by Peggye Fothergill MD on 02/06/2010 at 13:39:55    Phone Note   Patient Call       Calling Instructions: [phone]:  ADDERALL XR 15 MG CP24 (AMPHETAMINE-DEXTROAMPHETAMINE) 3 tablets by mouth daily  #90 x 0   Entered and Authorized by: Peggye Fothergill MD   Signed by: Peggye Fothergill MD on 02/06/2010   Method used: Print then Give to Patient   RxID: [id number]  ADDERALL (10MG )  TAB (AMPHETAMINE-DEXTROAMPHETAMINE) 1 by mouth every afternoon uad  #30 x 0   Entered and Authorized by: Peggye Fothergill MD   Signed by: Peggye Fothergill MD on 02/06/2010   Method used: Print then Give to Patient   RxID: [id number]        Summary of Call:  Needs refill on:  ADDERALL (10MG )  TAB (AMPHETAMINE-DEXTROAMPHETAMINE)   ADDERALL XR 15 MG CP24 (AMPHETAMINE-DEXTROAMPHETAMINE) 3 tablets by mouth daily  Patient took her last pills on Adderall.  Would like to pick up today.        Initial call taken by: Delight Hoh,  Feb 06, 2010 1:17 PM

## 2010-03-11 NOTE — Unmapped (Signed)
Signed by Lenna Sciara on 03/12/2010 at 12:38:18    Phone Note   Patient Call       Calling Instructions: [phone]  Follow-up for Phone Call   2nd call. She can be reached at [phone].  Patient is out of the medication.  Follow-up by: Delight Hoh,  March 12, 2010 11:51 AM      Additional Follow-up for Phone Call   rx given to [facility]  Additional Follow-up by: Peggye Fothergill MD,  March 12, 2010 12:29 PM  Prescriptions:  ADDERALL XR 15 MG CP24 (AMPHETAMINE-DEXTROAMPHETAMINE) 3 tablets by mouth daily  #90 x 0   Entered and Authorized by: Peggye Fothergill MD   Signed by: Peggye Fothergill MD on 03/12/2010   Method used: Print then Give to Patient   RxID: [id number]  ADDERALL (10MG )  TAB (AMPHETAMINE-DEXTROAMPHETAMINE) 1 by mouth every afternoon uad  #30 x 0   Entered and Authorized by: Peggye Fothergill MD   Signed by: Peggye Fothergill MD on 03/12/2010   Method used: Print then Give to Patient   RxID: [id number]        Summary of Call:  Need refillsl on the following medications:  ADDERALL (10MG )  TAB (AMPHETAMINE-DEXTROAMPHETAMINE) 1 by mouth every afternoon uad  ADDERALL XR 15 MG CP24 (AMPHETAMINE-DEXTROAMPHETAMINE) 3 tablets by mouth daily   Please call patient when they are ready for pick up.        Initial call taken by: Delight Hoh,  March 11, 2010 3:55 PM      Follow-up for Phone Call   lr adderall 10 mg 02/06/2010 #30  lr adderall 15mg  02/06/2010 #90  lov 01/30/2010  ll 10/28/2009  Follow-up by: Lenna Sciara,  March 12, 2010 10:07 AM    Additional Follow-up for Phone Call   called pt informed her rx was ready to be picked up  Additional Follow-up by: Lenna Sciara,  March 12, 2010 12:29 PM

## 2010-04-09 NOTE — Unmapped (Signed)
Signed by Lenna Sciara on 04/09/2010 at 18:23:08    Phone Note   Patient Call    [facility] Cell Phone #: [phone]  Caller: patient  Department: Family Medicine  Call for: [facility]     Follow-up for Phone Call   has to follow up in for furthe rrefills.  Follow-up by: Peggye Fothergill MD,  April 09, 2010 3:51 PM      Additional Follow-up for Phone Call   rx are not coming thru dr August Saucer   Additional Follow-up by: Lenna Sciara,  April 09, 2010 4:47 PM  Prescriptions:  ADDERALL XR 15 MG CP24 (AMPHETAMINE-DEXTROAMPHETAMINE) 3 tablets by mouth daily  #90 x 0   Entered and Authorized by: Peggye Fothergill MD   Signed by: Peggye Fothergill MD on 04/09/2010   Method used: Reprint   RxID: [id number]  ADDERALL (10MG )  TAB (AMPHETAMINE-DEXTROAMPHETAMINE) 1 by mouth every afternoon uad  #30 x 0   Entered and Authorized by: Peggye Fothergill MD   Signed by: Peggye Fothergill MD on 04/09/2010   Method used: Reprint   RxID: [id number]  ADDERALL XR 15 MG CP24 (AMPHETAMINE-DEXTROAMPHETAMINE) 3 tablets by mouth daily  #90 x 0   Entered and Authorized by: Peggye Fothergill MD   Signed by: Peggye Fothergill MD on 04/09/2010   Method used: Print then Give to Patient   RxID: [id number]  ADDERALL (10MG )  TAB (AMPHETAMINE-DEXTROAMPHETAMINE) 1 by mouth every afternoon uad  #30 x 0   Entered and Authorized by: Peggye Fothergill MD   Signed by: Peggye Fothergill MD on 04/09/2010   Method used: Print then Give to Patient   RxID: [id number]      Reason for Call: refill medication  Reason for call: ADDERRALL 15 MG TAKEN 3 TABLETS DAILY    ADDERRAL 10MG  TAKEN ONE TAB DAILY AS NEEDED.    PLEASE CALL WHEN READY FOR PICK UP      Initial call taken by: Elberta Fortis,  April 09, 2010 10:41 AM      Follow-up for Phone Call   lr 03/12/2010 #30x0  lov 01/30/2010  ll 10/28/2009  Follow-up by: Lenna Sciara,  April 09, 2010 12:39 PM    Additional Follow-up for Phone Call   pt informed she can pick up rx tomorrow   Additional Follow-up by: Lenna Sciara,  April 09, 2010 6:22 PM

## 2010-05-11 NOTE — Unmapped (Signed)
Signed by Peggye Fothergill MD on 05/12/2010 at 04:39:02    Phone Note   Patient Call       Calling Instructions: [phone]  Follow-up for Phone Call   ha s to follwo up for refill.  Follow-up by: Peggye Fothergill MD,  May 11, 2010 12:10 PM        Summary of Call:  Needs refill on the following:  ADDERALL XR 15 MG CP24 (AMPHETAMINE-DEXTROAMPHETAMINE) 3 tablets by mouth daily   ADDERALL (10MG )  TAB (AMPHETAMINE-DEXTROAMPHETAMINE) 1 by mouth every afternoon uad   Please call patient when ready for pick up.     Initial call taken by: Delight Hoh,  May 11, 2010 11:56 AM      Follow-up for Phone Call   lr 04/09/2010  lov 01/30/2010  ll 10/28/2009  Follow-up by: Lenna Sciara,  May 11, 2010 12:07 PM    Additional Follow-up for Phone Call   pt informed per the doctor's note to schedule an appt for a refill of this medication She stated she desperately needed this medication because she is out on vacation and needed her husband to pick it up. I informed the patient that per the last note of April 09, 2010 when she had requested for a refill of this med the doctor had stated she would need to schedule an appt to see the doctor for further refill.  Additional Follow-up by: Sung Amabile MA,  May 11, 2010 1:54 PM

## 2010-05-12 NOTE — Unmapped (Signed)
Signed by Lenna Sciara on 05/12/2010 at 11:55:09    Prescriptions:  ADDERALL XR 15 MG CP24 (AMPHETAMINE-DEXTROAMPHETAMINE) 3 tablets by mouth daily  #90 x 0   Entered and Authorized by: Peggye Fothergill MD   Signed by: Peggye Fothergill MD on 05/12/2010   Method used: Print then Give to Patient   RxID: [id number]  ADDERALL (10MG )  TAB (AMPHETAMINE-DEXTROAMPHETAMINE) 1 by mouth every afternoon uad  #30 x 0   Entered and Authorized by: Peggye Fothergill MD   Signed by: Peggye Fothergill MD on 05/12/2010   Method used: Print then Give to Patient   RxID: [id number]    x Phone Note   Patient Call       Calling Instructions: [phone]  Follow-up for Phone Call   rxs in chart,please advsie follow up when she is back.  Follow-up by: Peggye Fothergill MD,  May 12, 2010 10:59 AM        Summary of Call:  In regarding to the prescription's refills on Adderall.     Initial call taken by: Delight Hoh,  May 12, 2010 10:34 AM      Follow-up for Phone Call   spoke with pt informed her that she needed appt for refills she said she was on vacation in [place] and her husband would be joining her tomorrow and could bring her rx said she is going to go thru withdrawls if she does not have meds told her i would send to the dr and he what he wants to do and someone will call her back pt stated she will make appt for when she comes back from vacation   Follow-up by: Lenna Sciara,  May 12, 2010 10:57 AM    Additional Follow-up for Phone Call   informed pt rx ready to be picked up she said her husband will pick up today  Additional Follow-up by: Lenna Sciara,  May 12, 2010 11:55 AM

## 2010-05-19 LAB — URINE DRUG SCREEN/CONFIRM, SENDOUT
Barbiturates UR, 300  ng/mL Cutoff: NEGATIVE
Benzodiazepine Scrn UR: NEGATIVE
Benzoylecgonine: NEGATIVE ng/mL
Methadone, UR: NEGATIVE ug/mL
Phencyclidine Qual, UR: NEGATIVE ng/mL
Propoxyphene Screen, UR: NEGATIVE

## 2010-05-19 NOTE — Unmapped (Addendum)
Signed by Cleone Slim MD on 05/19/2010 at 15:02:30      Reason for Visit   Chief Complaint: f/u med    History from: patient    Allergies  No Known Allergies    Medications   Current Meds:   ADDERALL (10MG )  TAB (AMPHETAMINE-DEXTROAMPHETAMINE) 1 by mouth every afternoon uad  ALBUTEROL SULFATE HFA 108 MCG/ACT AERS (ALBUTEROL SULFATE) two puffs every four hours as needed  ADDERALL XR 15 MG CP24 (AMPHETAMINE-DEXTROAMPHETAMINE) 3 tablets by mouth daily  ADVAIR DISKUS 500-50 MCG/DOSE MISC (FLUTICASONE-SALMETEROL) 1  inhalation bid  SINGULAIR 10 MG TAB (MONTELUKAST SODIUM) one by mouth at bedtime       Vital Signs:   Wt: 129 lbs.      BMI: 20.90  BSA: 1.66  Wt chg (lbs): -7  Temperature: 97.8  degrees F  oral  Pulse: 79  BP: 112/68  Cuff size: regular    Pulse Oximetry:   O2 Saturation: 98 % w/ room air   Intake recorded by: Tana Coast MA on May 19, 2010 2:39 PM    History of Present Illness   Chief Complaint: f/u meds  cold for 1.5 mths., been using inhalers more often, tried mucinex- helped minimally  Adderall refilled for 1 month, wants to wean - plans on doing it when kids go back to school      PAST HISTORY  Past Medical History (reviewed - no changes required):  Environmental Allergies, Asthma, Chronic Back Pain, cervical dysplasia, Anxiety, Depression, ADD  Surgical History (reviewed - no changes required):  conization 1991    Family History (reviewed - no changes required): father: htn  mother: cancer (?uterine)  siblings: A&W  children: asthma/ allergy  remote: bone cancer  Social History (reviewed - no changes required): Marital Status: married,   Employment Status: employed part-time,   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      Physical Examination:   BP: 112/  68    Physical Exam- Detail:   General Appearance: well-developed, well-nourished and in no acute distress.  Ears: No lesions.   Tympanic membranes translucent, non-bulging.  Canal walls pink, without discharge.  Hearing grossly intact.  Oropharynx: mild PND  Respiratory: good a/e; soft scattered wheezes  Psychiatric: Judgement and insight are within normal limits.  Alert and oriented x3.  No mood disorders noted, appropriate affect.           New Medications:  ADDERALL XR 10 MG XR24H-CAP (AMPHETAMINE-DEXTROAMPHETAMINE) 4 tablets (40mg ) by mouth daily 1st 2 weeks then 3 tablets (30mg ) by mouth daily next 2 weeks  AMOXICILLIN 875 MG TABS (AMOXICILLIN) one by mouth twice a day  PREDNISONE 20 MG TABS (PREDNISONE) one by mouth twice daily X 3 days, then one by mouth daily X 4 days  ADDERALL XR 10 MG XR24H-CAP (AMPHETAMINE-DEXTROAMPHETAMINE) 2 tabets (20mg ) by mouth daily x 2 weeks then 1 tablet by mouth daily x 2 weeks then dc      Preventive Maintenance   Pap Smear Due: adv  Lipid Panel Due: adv            Prescriptions:  ADDERALL XR 10 MG XR24H-CAP (AMPHETAMINE-DEXTROAMPHETAMINE) 2 tabets (20mg ) by mouth daily x 2 weeks then 1 tablet by mouth daily x 2 weeks then dc  #42 x 0   Entered and Authorized by: Darla Mcdonald [name] MD   Signed by: Jeryl Wilbourn K  Thedore Mins MD on 05/19/2010   Method used: Print then Give to Patient   RxID: [id number]  ADDERALL XR 10 MG XR24H-CAP (AMPHETAMINE-DEXTROAMPHETAMINE) 4 tablets (40mg ) by mouth daily 1st 2 weeks then 3 tablets (30mg ) by mouth daily next 2 weeks  #98 x 0   Entered and Authorized by: Nasrin Lanzo [name] MD   Signed by: Cleone Slim MD on 05/19/2010   Method used: Print then Give to Patient   RxID: [id number]  PREDNISONE 20 MG TABS (PREDNISONE) one by mouth twice daily X 3 days, then one by mouth daily X 4 days  #10 x 0   Entered and Authorized by: Wilbert Hayashi [name] MD   Signed by: Cleone Slim MD on 05/19/2010   Method used: Print then Give to Patient   RxID: [id number]  AMOXICILLIN 875 MG TABS (AMOXICILLIN) one by mouth twice a day  #20 x 0   Entered and Authorized by: Quintavius Niebuhr [name] MD   Signed by: Cleone Slim MD on 05/19/2010   Method used: Print then Give to Patient   RxID: [id number]      Assessment and Plan   244-300    1. asthma- exacn 2o sinusitis- cont w/ SSI; add amoxicillin; safety net rx for predn  2. ADD-wants to taper donw Adderall: will write up a schedule to taper by 10mg  every 2 weeks; UDS    Problems   Status of Existing Problems:  Assessed ASTHMA NOS W/O STATUS ASTHMATICUS as deteriorated - Jhonatan Lomeli [name] MD  Assessed ADD as improved - Latori Beggs [name] MD    Medications   New Prescriptions/Refills:  ADDERALL XR 10 MG XR24H-CAP (AMPHETAMINE-DEXTROAMPHETAMINE) 2 tabets (20mg ) by mouth daily x 2 weeks then 1 tablet by mouth daily x 2 weeks then dc  #42 x 0, 05/19/2010, Baruc Tugwell K Nickayla Mcinnis MD  ADDERALL XR 10 MG XR24H-CAP (AMPHETAMINE-DEXTROAMPHETAMINE) 4 tablets (40mg ) by mouth daily 1st 2 weeks then 3 tablets (30mg ) by mouth daily next 2 weeks  #98 x 0, 05/19/2010, Trevonn Hallum K Leeona Mccardle MD  PREDNISONE 20 MG TABS (PREDNISONE) one by mouth twice daily X 3 days, then one by mouth daily X 4 days  #10 x 0, 05/19/2010, Ruvim Risko K Cleaster Shiffer MD  AMOXICILLIN 875 MG TABS (AMOXICILLIN) one by mouth twice a day  #20 x 0, 05/19/2010, Donzel Romack [name] MD    Today's Orders   Drug Screen, Urine (UDS) (87564) [*CPT-80100,80101]    Disposition:   as needed                 ]  Signed by Cleone Slim MD on 05/19/2010 at 15:02:30          Clinical Lists Changes    Orders:  Added new [place] order of 332[address] - Ofc Vst, Est Level IV ([id number]) - Signed

## 2010-05-19 NOTE — Unmapped (Signed)
Signed by Cleone Slim MD on 05/27/2010 at 19:36:13  Patient: Mary [name]  Note: All result statuses are Final unless otherwise noted.    Tests: (1) URINE DRUG SCREEN 1 ([id number])  ! AMPHETAMINES         [A]  POSITIVE  !   AMPHETAMINE             >20000 ng/mL  !   METHAMPHETAMINE         negative  !   MDA                     negative  !   MDMA                    negative  !   MDEA                    negative             Reporting limit 200 ng/mL           BARBITURATES              negative    BENZODIAZEPINES           negative    COCAINE                   negative    METHADONE                 negative  ! OPIATES                   negative    PCP (PHENCYCLIDINE)       negative    PROPOXYPHENE              negative  ! MARIJUANA                 negative  ! ETHANOL                   negative  ! COMMENT                   .             UDS Cutoff Levels                          Screen Cutoff    Confirm Cutoff      Amphetamines         1000 ng/mL        Amphetamine                          200 ng/mL        Methamphetamine                      200 ng/mL        MDMA                                 200 ng/mL        MDA                                  200 ng/mL        MDEA  200 ng/mL      Barbiturates          300 ng/mL        300 ng/mL      Benzodiazepines       300 ng/mL        300 ng/mL      Cocaine Metabolite    300 ng/mL        150 ng/mL      Methadone             300 ng/mL        300 ng/mL      Opiates        Codeine             300 ng/mL        100 ng/mL        Morphine            300 ng/mL        100 ng/mL        Hydrocodone         300 ng/mL        100 ng/mL        Hydromorphone       300 ng/mL        100 ng/mL        Oxycodone           100 ng/mL        100 ng/mL      Phencyclidine          25 ng/mL         25 ng/mL      Propoxyphene          300 ng/mL        300 ng/mL      THC Metabolite         20 ng/mL         15 ng/mL           Note: An exclamation mark (!) indicates a  result that was not dispersed into   the flowsheet.  Document Creation Date: 05/27/2010 12:34 PM  _______________________________________________________________________    (1) Order result status: Final  Collection or observation date-time: 05/19/2010  Requested date-time:   Receipt date-time: 05/20/2010 02:36  Reported date-time: 05/27/2010 12:00  Referring Physician:    Ordering Physician: [facility])  Specimen Source: R  Source: [name] Order Number: [id number] [phone]  Lab site: [facility]      [address]      [place]  [place]  [id number]

## 2010-06-23 ENCOUNTER — Inpatient Hospital Stay

## 2010-06-23 NOTE — Unmapped (Signed)
Signed by Cleone Slim MD on 06/23/2010 at 17:25:53                   [facility]         [facility]         [address]         [place], [place] [address]         p [phone] f [phone]                 June 23, 2010      [facility]  [address]    [place], [place] [address]                                                                                           RE:  TEST RESULTS   Mary [name]--1971-09-23)         Dear Mary [name]:      The following is an interpretation of your most recent tests.  Please take note of any instructions provided.      X-RAY: Xray results were normal.     Additional Comments: Do follow up with the pulmonologist as we discussed.       Hope you're feeling better,        Mary Fessel [name] MD

## 2010-06-23 NOTE — Unmapped (Signed)
Signed by Cleone Slim MD on 06/23/2010 at 14:32:41      Reason for Visit   Chief Complaint: c/o chest congestion for 12 wks    History from: patient    Allergies  No Known Allergies    Medications   Current Meds:   ALBUTEROL SULFATE HFA 108 MCG/ACT AERS (ALBUTEROL SULFATE) two puffs every four hours as needed  ADVAIR DISKUS 500-50 MCG/DOSE MISC (FLUTICASONE-SALMETEROL) 1  inhalation bid  SINGULAIR 10 MG TAB (MONTELUKAST SODIUM) one by mouth at bedtime  ADDERALL XR 10 MG XR24H-CAP (AMPHETAMINE-DEXTROAMPHETAMINE) 2 tabets (20mg ) by mouth daily x 2 weeks then 1 tablet by mouth daily x 2 weeks then dc       Vital Signs:   Wt: 130 lbs.      BMI: 21.06  BSA: 1.67  Wt chg (lbs): 1  Temperature: 98.2  degrees F  oral  Pulse: 73  BP: 110/80  Cuff size: regular    Pulse Oximetry:   O2 Saturation: 100 % w/ room air   Intake recorded by: Tana Coast MA on June 23, 2010 1:47 PM    Peak Flow   Peak Flow: 400 (475 goal) L/min.    History of Present Illness   Pt has had chest congestion x 12 wks. Her cough has been getting worse. It is productive. She is using her albuterol approx 5 times in the first half of the day. She continues to use her advair. She gets very short of breath. While exercising, her lips become tingly and then she gets light headed. She has some sinus drainage, but that has improved. Pt has had some chills but denies fevers. Pt has never been intubated, but has been hospitalized twice for her asthma that has resulted in pneumonia. Peak flow very low 3d ago.    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      Physical  Examination:   BP: 110/  80    Physical Exam- Detail:   General Appearance: well-developed, well-nourished and in no acute distress.  Ears: No lesions.  Tympanic membranes translucent, non-bulging.  Canal walls pink, without discharge.  Hearing grossly intact.  Nose/Face: no sinus ttp  Oropharynx: mild pharyngitis w/o exudate   Respiratory: Respiration un-labored.  Lung fields clear to auscultation.  No wheezing, rales, rhonchi or pleural rub.  Cardiac: S1 and S2 normal.  RRR without murmurs, rubs, gallops.    Psychiatric: Judgement and insight are within normal limits.  Alert and oriented x3.  No mood disorders noted, appropriate affect.           New Medications:  FLONASE 50 MCG/ACT SUSPN (FLUTICASONE PROPIONATE (NASAL)) one to two sprays each nostril daily as needed      Preventive Maintenance   Pap Smear Due: adv  Lipid Panel Due: adv                Kenalog   Dose: 40 mg  Lot #: [id number]  Mfgr: Marti Sleigh Squibb  Route: IM  Injection Site: Right Gluteus  Expiration Date: 01/03/2012  Given by: Tana Coast MA  Prescriptions:  FLONASE 50 MCG/ACT SUSPN (FLUTICASONE PROPIONATE (NASAL)) one to two sprays each nostril daily as needed  #1 x 5   Entered and Authorized by: [name]   Signed by: Cleone Slim MD on 06/23/2010   Method used: Print then Give to Patient   RxID: [id number]      Assessment and Plan   153-206    1. exacn v asthma- +++albuterol requirements; kenalog 40; predn: has rx from earlier to fill; shd get pulm eval    Problems   Status of Existing Problems:  Assessed ASTHMA NOS W/O STATUS ASTHMATICUS as deteriorated - [name] - Signed    Medications   New Prescriptions/Refills:  FLONASE 50 MCG/ACT SUSPN (FLUTICASONE PROPIONATE (NASAL)) one to two sprays each nostril daily as needed  #1 x 5, 06/23/2010, [name]    Today's Orders   Pulmonary Consult  [UCP-11111]  X-ray, Chest, PA & Lateral [CPT-71020]  99214 - Ofc Vst, Est Level IV [CPT-99214]  Triamcinolone  acetonide per 10mg  [CPT-J3301]  Admin Fee IM Injection [CPT-96372]    Patient Instructions   Call for a follow up appointment if you have worsening of your symptoms or if you are just not getting better     Disposition:   as needed                 ]

## 2010-06-23 NOTE — Unmapped (Signed)
Signed by   LinkLogic on 06/23/2010 at 16:32:23  Patient: Mary [name]  Note: All result statuses are Final unless otherwise noted.    Tests: (1) DIAG-CHEST PA & LATERAL [phone])    Order Note: [employer] Accession Number: [id number]    Order Note:                                 Dinah Beers     Patient: Mary [name], Mary [name]   DOB:     10/30/1970   MRN:     [id number]   FIN:       [id number]   Accn#:   [phone]                                  Diagnostic Radiology     Exam                       Exam Date/Time       Ordering Physician   DIAG-CHEST PA & LATERAL    06/23/2010 14:58 EDT [facility] MD, Hardie Shackleton K     Reason for Exam   asthma     Report    DIAG-CHEST PA + LATERAL  Jun 23, 2010 2:58:03 PM     Clinical Indication:  asthma     Findings:     Heart size is normal and the lungs are clear. Pulmonary vascularity is   within normal limits. No osseous abnormalities are appreciated.     Impression:     Normal chest.   ********** VERIFIED REPORT **********     Dictated: 06/23/2010 4:29 pm      UDSTUEN MD, York Cerise   Signed (Electronic Signature):  Barton Fanny MD, York Cerise             06/23/10   4:30     Technologist: SLA    Order Note:   EMR Routing to: Cleone Slim - ordering - [id number]    Note: An exclamation mark (!) indicates a result that was not dispersed into   the flowsheet.  Document Creation Date: 06/23/2010 4:32 PM  _______________________________________________________________________    (1) Order result status: Final  Collection or observation date-time: 06/23/2010 14:58:02  Requested date-time: 06/23/2010 14:35:00  Receipt date-time:   Reported date-time: 06/23/2010 16:30:02  Referring Physician: [facility]  Ordering Physician: [facility])  Specimen Source: RAD&Rad Type  Source: QRS  Filler Order Number: [id number]  Lab site: [facility]

## 2010-07-23 NOTE — Unmapped (Signed)
Signed by Cleone Slim MD on 07/23/2010 at 15:21:41      Reason for Visit   Chief Complaint: f/u add    History from: patient    Allergies  No Known Allergies    Medications   Current Meds:   ALBUTEROL SULFATE HFA 108 MCG/ACT AERS (ALBUTEROL SULFATE) two puffs every four hours as needed  ADVAIR DISKUS 500-50 MCG/DOSE MISC (FLUTICASONE-SALMETEROL) 1  inhalation bid  SINGULAIR 10 MG TAB (MONTELUKAST SODIUM) one by mouth at bedtime  FLONASE 50 MCG/ACT SUSPN (FLUTICASONE PROPIONATE (NASAL)) one to two sprays each nostril daily as needed       Vital Signs:   Wt: 132 lbs.      BMI: 21.38  BSA: 1.68  Wt chg (lbs): 2  Temperature: 97.8  degrees F  oral  Pulse: 78  BP: 122/82  Cuff size: regular    Pulse Oximetry:   O2 Saturation: 100 % w/ room air   Intake recorded by: Tana Coast MA on July 23, 2010 3:07 PM    History of Present Illness   Chief Complaint: fu ADD  has been on adderall for quite some time  max dose was  55mg   has cut down to 30mg   has taper guide but feels it may b too quick a taper        ATTENTION DEFICIT/ HYPERACTIVE SYMPTOMS     PAST HISTORY  Past Medical History (reviewed - no changes required):  Environmental Allergies, Asthma, Chronic Back Pain, cervical dysplasia, Anxiety, Depression, ADD  Social History (reviewed - no changes required): Marital Status: married,   Employment Status: employed part-time,   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       Physical Examination:   BP: 122/  82    Physical Exam- Detail:   General Appearance: well-developed, well-nourished and in no acute distress.  Psychiatric: Judgement and insight are within normal limits.  Alert and oriented x3.  No mood disorders noted, appropriate affect.           New Medications:  ADDERALL XR 15 MG CP24 (AMPHETAMINE-DEXTROAMPHETAMINE) two (30mg ) by mouth daily  ADDERALL XR 5 MG  XR24H-CAP (AMPHETAMINE-DEXTROAMPHETAMINE) 5 tabs (25mg ) by mouth daily      Preventive Maintenance   Pap Smear Due: adv  Lipid Panel Due: adv            Prescriptions:  ADDERALL XR 5 MG XR24H-CAP (AMPHETAMINE-DEXTROAMPHETAMINE) 5 tabs (25mg ) by mouth daily  #150 x 0   Entered and Authorized by: Brylynn Hanssen [name] MD   Signed by: Cleone Slim MD on 07/23/2010   Method used: Print then Give to Patient   RxID: [id number]  ADDERALL XR 15 MG CP24 (AMPHETAMINE-DEXTROAMPHETAMINE) two (30mg ) by mouth daily  #60 x 0   Entered and Authorized by: Jilleen Essner [name] MD   Signed by: Cleone Slim MD on 07/23/2010   Method used: Print then Give to Patient   RxID: [id number]      Assessment and Plan   -320    1. ADD- cont on current dose for 1 moremonth then trial w/ dec to 25mg /d; can cont taper monthly at next visit  2. asthma- back to baseline; plans to see pulm; declines fluvax    Problems   Status of Existing Problems:  Assessed ASTHMA NOS W/O STATUS ASTHMATICUS as improved -  Claudis Giovanelli [name] MD  Assessed ADD as unchanged - Seraj Dunnam [name] MD    Medications   New Prescriptions/Refills:  ADDERALL XR 5 MG XR24H-CAP (AMPHETAMINE-DEXTROAMPHETAMINE) 5 tabs (25mg ) by mouth daily  #150 x 0, 07/23/2010, Saysha Menta K Linden Tagliaferro MD  ADDERALL XR 15 MG CP24 (AMPHETAMINE-DEXTROAMPHETAMINE) two (30mg ) by mouth daily  #60 x 0, 07/23/2010, Miyu Fenderson [name] MD    Today's Orders   [phone] - Ofc Vst, Est Level III [UEA-54098]    Patient Instructions   Adderall:  1. next month: 30mg /d  2. following month: 25mg /d    Make sure to see the pulmonary doctor about your asthma    Disposition:   Return to clinic for Doctor Visit in 2 month(s)                 ]

## 2010-07-30 NOTE — Unmapped (Signed)
Signed by Cleone Slim MD on 07/30/2010 at 00:00:00  [facility]      Imported By: Sung Amabile MA 09/23/2010 16:12:57    _____________________________________________________________________    External Attachment:    Please see [employer] EMR for this document.

## 2010-08-19 NOTE — Unmapped (Signed)
Signed by Tana Coast MA on 08/19/2010 at 11:57:38    Phone Note   Call from Pharmacy  Pharmacy Name: [facility]  Caller: FAX  Pharmacy Phone Number: [phone]  Pharmacy Fax Number: [phone]  Reason for Call: needs renewal  Summary of call: Medication: Proair Inhaler (200 puffs) 8.5 GM  RX#: [phone]  Qty: 25.5  Sig: Inhale 2 puffs by mouth every 4 hours as needed   last refill: 5.16.11  Initial call taken by: Kristine Royal,  August 19, 2010 8:11 AM    Follow-up for Phone Call   rx called into pharm  Follow-up by: Tana Coast MA,  August 19, 2010 11:57 AM    Prescriptions:  PROAIR HFA 108 (90 BASE) MCG/ACT AERS (ALBUTEROL SULFATE) one to two puffs by mouth every four to six hours as needed  #1 x 11   Entered and Authorized by: Saamiya Jeppsen [name] MD   Signed by: Cleone Slim MD on 08/19/2010   Method used: Telephoned to ...     [facility] [address] (retail)     [address]     [place], [place]  [address]     Ph: [phone]     Fax: [phone]   RxID: [id number]

## 2010-09-18 NOTE — Unmapped (Signed)
Signed by Cleone Slim MD on 09/18/2010 at 15:46:02      Reason for Visit   Chief Complaint: f/u add    History from: patient    Allergies  No Known Allergies    Medications   OTC Meds Reviewed  Medication list reviewed during this update. Current Meds:   PROAIR HFA 108 (90 BASE) MCG/ACT AERS (ALBUTEROL SULFATE) one to two puffs by mouth every four to six hours as needed  ADVAIR DISKUS 500-50 MCG/DOSE MISC (FLUTICASONE-SALMETEROL) 1  inhalation bid  SINGULAIR 10 MG TAB (MONTELUKAST SODIUM) one by mouth at bedtime  FLONASE 50 MCG/ACT SUSPN (FLUTICASONE PROPIONATE (NASAL)) one to two sprays each nostril daily as needed  ADDERALL XR 15 MG CP24 (AMPHETAMINE-DEXTROAMPHETAMINE) two (30mg ) by mouth daily  ADDERALL XR 5 MG XR24H-CAP (AMPHETAMINE-DEXTROAMPHETAMINE) 5 tabs (25mg ) by mouth daily       Vital Signs:   Wt: 132 lbs.      BMI: 21.38  BSA: 1.68  Wt chg (lbs): 0  Temperature: 97.9  degrees F  oral  Pulse: 65  BP: 110/80  Cuff size: regular    Pulse Oximetry:   O2 Saturation: 100 % w/ room air Smoking Hx: non-smoker    Intake recorded by: Tana Coast MA on September 18, 2010 3:24 PM        HPI Multiple Chronic   Condition(s): Asthma.  Additional Dx: ADD  Comments: last visit we were trying to dec dose v adderall but symptoms r worse    Current Status:   Compliance with tx: good  Medication Tolerance: good    ROS/Symptoms:   Patient complains of the following respiratory symptoms: pulm symptoms r better, nearer to baseline  Patient complains of the following psychological symptoms: significant concentration problems on current dose; even 30mg  wasnt doing v well; did well at 35mg /d    PAST HISTORY  Past Medical History (reviewed - no changes required):  Environmental Allergies, Asthma, Chronic Back Pain, cervical dysplasia, Anxiety, Depression, ADD  Social History (reviewed - no changes required): Marital Status: married,   Employment Status: employed part-time,   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      Physical Examination:   BP: 110/  80    Physical Exam- Detail:   General Appearance: well-developed, well-nourished and in no acute distress.  Psychiatric: Judgement and insight are within normal limits.  Alert and oriented x3.  No mood disorders noted, appropriate affect.           New Medications:  ADDERALL XR 25 MG XR24H-CAP (AMPHETAMINE-DEXTROAMPHETAMINE) 1 by mouth every morning  DEA [id number]  ADDERALL XR 10 MG XR24H-CAP (AMPHETAMINE-DEXTROAMPHETAMINE) 1 by mouth every morning (to be taken w/ 25mg : total daily dose: 35mg )  [id number]        Preventive Maintenance   Pap Smear Due: adv  Lipid Panel Due: adv            Prescriptions:  ADDERALL XR 10 MG XR24H-CAP (AMPHETAMINE-DEXTROAMPHETAMINE) 1 by mouth every morning (to be taken w/ 25mg : total daily dose: 35mg )  [id number]  #30 x 0   Entered and Authorized by: Brittanya Winburn [name] MD   Signed by: Cleone Slim MD on 09/18/2010   Method used: Print then Give to Patient   RxID: [id number]  ADDERALL XR 25 MG XR24H-CAP (AMPHETAMINE-DEXTROAMPHETAMINE) 1 by mouth every morning  DEA [id number]  #30 x 0  Entered and Authorized by: Ryelynn Guedea [name] MD   Signed by: Cleone Slim MD on 09/18/2010   Method used: Print then Give to Patient   RxID: [id number]  SINGULAIR 10 MG TAB (MONTELUKAST SODIUM) one by mouth at bedtime  #90 x 3   Entered and Authorized by: Jaylun Fleener [name] MD   Signed by: Cleone Slim MD on 09/18/2010   Method used: Print then Give to Patient   RxID: [id number]  ADVAIR DISKUS 500-50 MCG/DOSE MISC (FLUTICASONE-SALMETEROL) 1  inhalation bid  #3 x 3   Entered and Authorized by: Ajah Vanhoose [name] MD   Signed by: Cleone Slim MD on 09/18/2010   Method used: Print then Give to Patient   RxID: [id number]      Assessment and Plan   335-345    1. ADD- reviewed oarrs report; palnned taper hasnt been effective; will inc dose to  35mg /d  2. asthma- back to baseline; plans to see pulm    Problems   Status of Existing Problems:  Assessed ASTHMA NOS W/O STATUS ASTHMATICUS as improved - Zoejane Gaulin [name] MD  Assessed ADD as deteriorated - Dymon Summerhill [name] MD    Medications   New Prescriptions/Refills:  ADDERALL XR 10 MG XR24H-CAP (AMPHETAMINE-DEXTROAMPHETAMINE) 1 by mouth every morning (to be taken w/ 25mg : total daily dose: 35mg )  [id number]  #30 x 0, 09/18/2010, December Hedtke K Hassan Blackshire MD  ADDERALL XR 25 MG XR24H-CAP (AMPHETAMINE-DEXTROAMPHETAMINE) 1 by mouth every morning  DEA [id number]  #30 x 0, 09/18/2010, Timica Marcom K Lubertha Leite MD  SINGULAIR 10 MG TAB (MONTELUKAST SODIUM) one by mouth at bedtime  #90 x 3, 09/18/2010, Stassi Fadely [name] MD  ADVAIR DISKUS 500-50 MCG/DOSE MISC (FLUTICASONE-SALMETEROL) 1  inhalation bid  #3 x 3, 09/18/2010, Jesselle Laflamme [name] MD    Today's Orders   [phone] - Ofc Vst, Est Level III [[id number]]    Disposition:    *Patient Care Summary printed and given to patient.     Return to clinic for Doctor Visit in 1 month(s)                 ]

## 2010-10-16 NOTE — Unmapped (Signed)
Signed by Cleone Slim MD on 10/16/2010 at 09:12:46      Reason for Visit   Chief Complaint: med refills    History from: patient    Allergies  No Known Allergies    Medications   Medication list reviewed during this update. PROAIR HFA 108 (90 BASE) MCG/ACT AERS (ALBUTEROL SULFATE) one to two puffs by mouth every four to six hours as needed  ADVAIR DISKUS 500-50 MCG/DOSE MISC (FLUTICASONE-SALMETEROL) 1  inhalation bid  SINGULAIR 10 MG TAB (MONTELUKAST SODIUM) one by mouth at bedtime  FLONASE 50 MCG/ACT SUSPN (FLUTICASONE PROPIONATE (NASAL)) one to two sprays each nostril daily as needed  ADDERALL XR 25 MG XR24H-CAP (AMPHETAMINE-DEXTROAMPHETAMINE) 1 by mouth every morning  DEA [id number]  ADDERALL XR 10 MG XR24H-CAP (AMPHETAMINE-DEXTROAMPHETAMINE) 1 by mouth every morning (to be taken w/ 25mg : total daily dose: 35mg )  [id number]       Vital Signs:   Wt: 136.0 lbs.      BMI: 22.03  BSA: 1.70  Wt chg (lbs): 4  Temperature: 98.0  degrees F  oral  Pulse: 72  BP: 112/70  Cuff size: regular    Pulse Oximetry:   O2 Saturation: 100 % w/ room air Smoking Hx: non-smoker    Intake recorded by: Lenna Sciara on October 16, 2010 8:56 AM    History of Present Illness   Chief Complaint: fu ADD  has been on adderall for quite some time  max dose was  55mg   last visit we stopped the planned taper and inc dose from 30 to 35  no s/e  doesnt want to try taper again    PAST HISTORY  Past Medical History (reviewed - no changes required):  Environmental Allergies, Asthma, Chronic Back Pain, cervical dysplasia, Anxiety, Depression, ADD  Social History (reviewed - no changes required): Marital Status: married,   Employment Status: employed part-time,   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      Physical Examination:   BP: 112/  70    Physical Exam- Detail:   General Appearance: well-developed, well-nourished and in no acute  distress.  Psychiatric: Judgement and insight are within normal limits.  Alert and oriented x3.  No mood disorders noted, appropriate affect.           New Medications:  ADDERALL XR 25 MG XR24H-CAP (AMPHETAMINE-DEXTROAMPHETAMINE) 1 by mouth every morning  DEA [id number] (to be filled after ___days)  ADDERALL XR 10 MG XR24H-CAP (AMPHETAMINE-DEXTROAMPHETAMINE) 1 by mouth every morning (to be taken w/ 25mg : total daily dose: 35mg )  [id number] (to be filled after ___ days)        Preventive Maintenance   Pap Smear Due: adv  Lipid Panel Due: adv            Prescriptions:  ADDERALL XR 10 MG XR24H-CAP (AMPHETAMINE-DEXTROAMPHETAMINE) 1 by mouth every morning (to be taken w/ 25mg : total daily dose: 35mg )  [id number]  #30 x 0   Entered and Authorized by: Rocsi Hazelbaker [name] MD   Signed by: Cleone Slim MD on 10/16/2010   Method used: Print then Give to Patient   RxID: [id number]  ADDERALL XR 25 MG XR24H-CAP (AMPHETAMINE-DEXTROAMPHETAMINE) 1 by mouth every morning  DEA [id number]  #30 x 0   Entered and Authorized by: Taylar Hartsough [name] MD   Signed by: Cleone Slim MD on 10/16/2010   Method used: Print  then Give to Patient   RxID: [id number]  ADDERALL XR 10 MG XR24H-CAP (AMPHETAMINE-DEXTROAMPHETAMINE) 1 by mouth every morning (to be taken w/ 25mg : total daily dose: 35mg )  [id number]  #30 x 0   Entered and Authorized by: Adison Reifsteck [name] MD   Signed by: Cleone Slim MD on 10/16/2010   Method used: Print then Give to Patient   RxID: [id number]  ADDERALL XR 25 MG XR24H-CAP (AMPHETAMINE-DEXTROAMPHETAMINE) 1 by mouth every morning  DEA [id number]  #30 x 0   Entered and Authorized by: Katelyne Galster [name] MD   Signed by: Cleone Slim MD on 10/16/2010   Method used: Print then Give to Patient   RxID: [id number]  ADDERALL XR 10 MG XR24H-CAP (AMPHETAMINE-DEXTROAMPHETAMINE) 1 by mouth every morning (to be taken w/ 25mg : total daily dose: 35mg )  [id number]  #30 x 0   Entered and Authorized by: Aleena Kirkeby [name] MD   Signed  by: Cleone Slim MD on 10/16/2010   Method used: Print then Give to Patient   RxID: [id number]  ADDERALL XR 25 MG XR24H-CAP (AMPHETAMINE-DEXTROAMPHETAMINE) 1 by mouth every morning  DEA [id number]  #30 x 0   Entered and Authorized by: Buell Parcel [name] MD   Signed by: Cleone Slim MD on 10/16/2010   Method used: Print then Give to Patient   RxID: [id number]      Assessment and Plan   900-910    1. ADD-sx not optimal but satisfied at current dose; continue w/ current treatment ; oarrs report last visit/uds in aug utd  2. asthma- reminded to get appt w/ pulm    Medications   New Prescriptions/Refills:  ADDERALL XR 10 MG XR24H-CAP (AMPHETAMINE-DEXTROAMPHETAMINE) 1 by mouth every morning (to be taken w/ 25mg : total daily dose: 35mg )  [id number]  #30 x 0, 10/16/2010, Kennie Karapetian K Terrius Gentile MD  ADDERALL XR 25 MG XR24H-CAP (AMPHETAMINE-DEXTROAMPHETAMINE) 1 by mouth every morning  DEA [id number]  #30 x 0, 10/16/2010, Inetha Maret K Pratyush Ammon MD  ADDERALL XR 10 MG XR24H-CAP (AMPHETAMINE-DEXTROAMPHETAMINE) 1 by mouth every morning (to be taken w/ 25mg : total daily dose: 35mg )  [id number]  #30 x 0, 10/16/2010, Mishka Stegemann K Requan Hardge MD  ADDERALL XR 25 MG XR24H-CAP (AMPHETAMINE-DEXTROAMPHETAMINE) 1 by mouth every morning  DEA [id number]  #30 x 0, 10/16/2010, Cora Stetson K Catori Panozzo MD  ADDERALL XR 10 MG XR24H-CAP (AMPHETAMINE-DEXTROAMPHETAMINE) 1 by mouth every morning (to be taken w/ 25mg : total daily dose: 35mg )  [id number]  #30 x 0, 10/16/2010, Elizibeth Breau K Omare Bilotta MD  ADDERALL XR 25 MG XR24H-CAP (AMPHETAMINE-DEXTROAMPHETAMINE) 1 by mouth every morning  DEA [id number]  #30 x 0, 10/16/2010, Hudsen Fei [name] MD    Today's Orders   [phone] - Ofc Vst, Est Level II [[id number]]    Patient Instructions   1. Asthma: do call to make an appointment w/ Dr Eleanora Neighbor, [facility]    Disposition:    *Patient Care Summary printed and given to patient.     Return to clinic for Doctor Visit in 3 month(s)                 ]

## 2010-10-30 NOTE — Unmapped (Signed)
Signed by Tana Coast MA on 10/30/2010 at 15:37:56    Phone Note   Patient Call    Call back at Work: [phone]     Calling Instructions: Cell  Pharmacy Information:   [facility] [phone]    Summary of Call:  Patient started a rash last week on her stomach, arms and back. and it started to improve and now has come back in full force all over her body including her face. Itchy, red raise bumps (not blotches). No seeping. Not on bottom of feet or palms of her hand. No appt available. Came out in full force today.      Initial call taken by: Delight Hoh,  October 30, 2010 3:31 PM      Follow-up for Phone Call   pt informed to go to local urgent care center. no appt open for today.  Follow-up by: Tana Coast MA,  October 30, 2010 3:37 PM

## 2011-01-07 NOTE — Unmapped (Signed)
Signed by Peggye Fothergill MD on 01/11/2011 at 10:36:11      Reason for Visit   Chief Complaint: pre op     History from: patient    Allergies  No Known Allergies    Medications   Medication list reviewed during this update. PROAIR HFA 108 (90 BASE) MCG/ACT AERS (ALBUTEROL SULFATE) one to two puffs by mouth every four to six hours as needed  ADVAIR DISKUS 500-50 MCG/DOSE MISC (FLUTICASONE-SALMETEROL) 1  inhalation bid  SINGULAIR 10 MG TAB (MONTELUKAST SODIUM) one by mouth at bedtime  FLONASE 50 MCG/ACT SUSPN (FLUTICASONE PROPIONATE (NASAL)) one to two sprays each nostril daily as needed  ADDERALL XR 25 MG XR24H-CAP (AMPHETAMINE-DEXTROAMPHETAMINE) 1 by mouth every morning  DEA [id number] (to be filled after ___days)  ADDERALL XR 10 MG XR24H-CAP (AMPHETAMINE-DEXTROAMPHETAMINE) 1 by mouth every morning (to be taken w/ 25mg : total daily dose: 35mg )  [id number] (to be filled after ___ days)       Vital Signs:   Ht: 65 in.  Wt: 132.0 lbs.      BMI: 22.05  BSA: 1.66  Wt chg (lbs): -4  Temperature: 98.0  degrees F  oral  Pulse: 75  BP: 102/60  Cuff size: regular    Pulse Oximetry:   O2 Saturation: 100 % w/ room air Smoking Hx: non-smoker  I have been asked to perform a pre-operative/pre-procedure consult of this patient by: dr Desiree Lucy [phone]  Procedure: rt knee surgery   Date of procedure: 01/13/2011  Data Reviewed: r  meniscal tear      PAST HISTORY  Past Medical History (reviewed - no changes required):  Environmental Allergies, Asthma, Chronic Back Pain, cervical dysplasia, Anxiety, Depression, ADD  Surgical History (reviewed - no changes required):  conization 1991    Family History (reviewed - no changes required): father: htn  mother: cancer (?uterine)  siblings: A&W  children: asthma/ allergy  remote: bone cancer  Social History (reviewed - no changes required): Marital Status: married,   Children: 2,   Employment Status: homemaker,   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   Eyes: Denies any specific issues at this time.   Ears/Nose/Throat: Denies any specific issues at this time.   Cardiovascular: Denies any specific issues at this time.   Respiratory: Denies any specific issues at this time.   Gastrointestinal: Denies any specific issues at this time.   Genitourinary: Denies any specific issues at this time.   Musculoskeletal: r knee pain  Neurologic: Denies any specific issues at this time.   Psychiatric: Denies any specific issues at this time.   Endocrine: Denies any specific issues at this time.   Heme/Lymphatic: Denies any specific issues at this time.   Allergic/Immunologic: Denies any specific issues at this time.       Physical Examination:   BP: 102/  60    Physical Exam- Detail:   General Appearance: well-developed, well-nourished and in no acute distress.  Skin: No suspicious rashes or lesions.  Eyes: Sclera white, conjunctiva without injection and pallor.  PERRLA.  EOMI  Ears: No lesions.  Tympanic membranes translucent, non-bulging.  Canal walls pink, without discharge.  Hearing grossly intact.  Nose/Face: Mucosa and turbinates pink, septum midline.  No polyps, no discharge, no lesions.  Oropharynx: Normal appearance.  No erythema, exudate or mass. No tonsillar swelling.  Respiratory: Respiration un-labored.  Lung fields clear to auscultation.  No wheezing, rales, rhonchi or pleural rub.  Neck: No thyromegaly.  No nodules, masses or tenderness.  Lymphatic: Areas palpated not enlarged:  cervical, supraclavicular.  Cardiac: S1 and S2 normal.  RRR without murmurs, rubs, gallops.  No JVD.  Vascular: No carotid bruits.  No edema or varicosities.  Abdomen: No masses or tenderness. Bowel sounds active x4 quad.  Liver and spleen are without tenderness  or enlargement.  No hernias.  Neurologic: Cranial nerves 2 through 12 intact.  Deep tendon reflexes 2+ bilaterally.  Sensation intact.  No spasticity.  Strength is 5/5 in upper and lower extremities bilaterally.  Psychiatric: Judgement and insight are within normal limits.  Alert and oriented x3.  No mood disorders noted, appropriate affect.  Musculoskeletal: Gait coordinated and smooth.  Digits are without clubbing or cyanosis.           New Problems:  PREOPERATIVE EXAMINATION (ICD-V72.84)  New Medications:  ADDERALL XR 25 MG XR24H-CAP (AMPHETAMINE-DEXTROAMPHETAMINE) 1 by mouth every morning  ADDERALL XR 10 MG XR24H-CAP (AMPHETAMINE-DEXTROAMPHETAMINE) 1 by mouth every morning (to be taken w/ 25mg :        Preventive Maintenance   Pap Smear Due: adv  Lipid Panel Due: adv            Prescriptions:  ADDERALL XR 10 MG XR24H-CAP (AMPHETAMINE-DEXTROAMPHETAMINE) 1 by mouth every morning (to be taken w/ 25mg :  #30 x 0   Entered and Authorized by: Peggye Fothergill MD   Signed by: Peggye Fothergill MD on 01/07/2011   Method used: Print then Give to Patient   RxID: [id number]  ADDERALL XR 25 MG XR24H-CAP (AMPHETAMINE-DEXTROAMPHETAMINE) 1 by mouth every morning  #30 x 0   Entered and Authorized by: Peggye Fothergill MD   Signed by: Peggye Fothergill MD on 01/07/2011   Method used: Print then Give to Patient   RxID: [id number]  ADDERALL XR 10 MG XR24H-CAP (AMPHETAMINE-DEXTROAMPHETAMINE) 1 by mouth every morning (to be taken w/ 25mg : total daily dose: 35mg )  [id number] (to be filled after ___ days)  #0 x 0   Entered and Authorized by: Peggye Fothergill MD   Signed by: Peggye Fothergill MD on 01/07/2011   Method used: Print then Give to Patient   RxID: [id number]  ADDERALL XR 25 MG XR24H-CAP (AMPHETAMINE-DEXTROAMPHETAMINE) 1 by mouth every morning  DEA [id number] (to be filled after ___days)  #0 x 0   Entered and Authorized by: Peggye Fothergill MD   Signed by: Peggye Fothergill MD on 01/07/2011   Method used: Print then Give to  Patient   RxID: [id number]      Assessment and Plan   preop clearance  ADD refileld meds    Problems New Problems:  Dx of PREOPERATIVE EXAMINATION (ICD-V72.84)  Onset: 01/07/2011    Medications   New Prescriptions/Refills:  ADDERALL XR 10 MG XR24H-CAP (AMPHETAMINE-DEXTROAMPHETAMINE) 1 by mouth every morning (to be taken w/ 25mg :  #30 x 0, 01/07/2011, Peggye Fothergill MD  ADDERALL XR 25 MG XR24H-CAP (AMPHETAMINE-DEXTROAMPHETAMINE) 1 by mouth every morning  #30 x 0, 01/07/2011, Peggye Fothergill MD  ADDERALL XR 10 MG XR24H-CAP (AMPHETAMINE-DEXTROAMPHETAMINE) 1 by mouth every morning (to be taken w/ 25mg : total daily dose: 35mg )  [id number] (to be filled after ___ days)  #0 x 0, 01/07/2011, Peggye Fothergill MD  ADDERALL XR 25 MG XR24H-CAP (AMPHETAMINE-DEXTROAMPHETAMINE) 1 by mouth every  morning  DEA [id number] (to be filled after ___days)  #0 x 0, 01/07/2011, Peggye Fothergill MD    Today's Orders   [phone] - Ofc Consult, Level II [CPT-99242]    Pre OP Assessment:   Benefits of procedure outweigh risks; agree with planned procedure/anesthesia.  Patient is medically stable for the proposed procedure.  Estimated A.S.A Class: 1) Healthy patient with no disease outside of the surgical process  Potential problems identified: none    Recommendation/Plan:   Recommendations/Plan: p is cleared for surgery.No NSAIDS .  Risks of surgery pertaining to above identified problems discussed with pt/guardian.  Pt/guardian agrees to follow through with above prescribed testing and/or medication changes (and appropriate testing f/u) prior to procedure.  Pt/guardian given copy of this consultation for surgeon.  You may contact us: by phone #: [phone]                ]

## 2011-01-11 NOTE — Unmapped (Signed)
Signed by Tama Headings MA on 01/11/2011 at 16:48:11    Phone Note   Other Incoming Call  Call From: [facility]  Caller's Name: barb  Caller Phone #: [phone]  Call For: dr. August Saucer  Summary of call: Needs pre op hnp.    fax:[phone]  Initial call taken by: Schuyler Amor,  January 11, 2011 4:24 PM    Follow-up for Phone Call   Faxed   Phone Call Completed  Follow-up by: Tama Headings MA,  January 11, 2011 4:48 PM      ]
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
## 2011-03-05 NOTE — Unmapped (Signed)
Signed by Rockwell Germany CNP on 03/05/2011 at 14:55:02      History of Present Illness   Seen at the request of:   NPV  Chief Complaint:   Lesions  40 year old white female with non hleaing asymptomatic lesion on middle chest just rt of midline x few months. No mod factors. No treatments. No h/o NMSC.      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[employer]  Exercise: tennis 2-3/week,   Caffeine per Day: <1  Seatbelt Use: 100 % of the time  Alcohol Use: occasionally  Drug Use: none  Tobacco Usage:non-smoker      Dermatology Past History   Personal History of Skin Cancer/Melanoma:   No  Sunburns Easily:   Yes  Uses Sunscreen:   Yes  Comments:   (+) Hx of tanning bed use        Intake-Dermatology     Vital Signs     Height: 65 Last Pap: Neg (08/24/2007 12:00:00 AM) Consult requested by (primary): NPV      Allergies  No Known Allergies  Current Medications:    PROAIR HFA 108 (90 BASE) MCG/ACT AERS (ALBUTEROL SULFATE) one to two puffs by mouth every four to six hours as needed  ADVAIR DISKUS 500-50 MCG/DOSE MISC (FLUTICASONE-SALMETEROL) 1  inhalation bid  SINGULAIR 10 MG TAB (MONTELUKAST SODIUM) one by mouth at bedtime  FLONASE 50 MCG/ACT SUSPN (FLUTICASONE PROPIONATE (NASAL)) one to two sprays each nostril daily as needed  ADDERALL XR 25 MG XR24H-CAP (AMPHETAMINE-DEXTROAMPHETAMINE) 1 by mouth every morning (to be filled after ___days) DEA: [id number]  ADDERALL XR 10 MG XR24H-CAP  (AMPHETAMINE-DEXTROAMPHETAMINE) 1 by mouth every morning (to be taken w/ 25mg :) (to be filled after ___days) DEA: BS [id number]  ADDERALL (10MG )  TAB (AMPHETAMINE-DEXTROAMPHETAMINE) one by mouth  daily  noon or mid afternoon to be filled after ___ days DEA: [id number]  TRETINOIN 0.025 % CREA (TRETINOIN)      Intake recorded by: Minna Merritts MA  March 05, 2011 2:11 PM    Medications reviewed, updated and verified with patient or patient representative.    Screening for unhealthy alcohol use performed.  Women (Any Age) or Men (over Age 31): less than 3 drinks per occasion      Review of Systems   General: Denies chills, sweats.   Allergic/Immunologic: Reports seasonal allergy symptoms.     Physical Examination:    Examination was performed of the following and were unremarkable:scalp/hair, head/face, conjunctivae/eyelids, gums/teeth/lips, neck, abdomen, back, RUE, LUE, RLE, LLE, nails/digits, genitalia/groin/buttocks.      Abnormalities noted include:  breast/axilla/chest.   Middle chest just rt of midline with 8mm pink papule.  Complete Skin Exam Completed: 03/05/2011      Assessment and Plan     Problem #1 Assessment:  middle chest just rt of midline.........r/o LK  Procedure #1: 1% Lidocaine with epinepherine injected.  Shave biopsy sent to pathology.  Patient educated about scar, infection,   and dyspigmentation.  Instructed to call office in 7 days if not heard from [place] re: bx results. Information provided   Today's Orders   [phone] - Patient Encounter Documented Using an EHR Certified by [facility] [*CPT-G8447]  [phone] - Current Medications with Name, Dosages, Frequency and Route Documented [*CPT-G8427]  3016F - Unhealthy Alcohol Use Screening Performed [*CPT-3016F]  Referred by:   [[id number]]  13244 - Ofc Vst, New Level III [CPT-99203]  11100 - Biopsy, skin, any site first lesion [CPT-11100]    Follow up:   Follow Up Comments: 1 yr/ as needed for any changes or concerns

## 2011-03-05 NOTE — Unmapped (Signed)
Signed by Minna Merritts MA on 03/05/2011 at 16:01:06      Dermatopathology Requisition Form   Ordering Provider:  Juan Quam CNP  DOB:  [birth year]  Patient Race:  Cliffton Asters  Gender:  Female  Social Security #:  [id number]  MRN:  [id number]  Biopsy Date:  03/05/2011    Diagnosis/Site:   R/O LK- MIDDLE CHEST JUST R OF MIDLINE    Tests Requested:    Biopsy- Histopathology ([facility]) 78295 [CPT-88305]

## 2011-03-05 NOTE — Unmapped (Addendum)
Signed by Minna Merritts MA on 03/05/2011 at 16:01:36    Dermatology Biopsy Reporting      SKIN BIOPSY   Site #1:   MIDDLE CHEST JUST R OF MIDLINE  Differential Diagnosis #1:   R/O LK    Procedure in Detail:   With the patient in the appropriate position, the perilesional and lesional skin of lesion(s) ofwas scrubbed with  alcohol.    1% lidocaine with epinephrine 1:100000.  Lesional skin was incised with 1/2 blade  The specimen(s) was sent for histopathologic examination.  Hemostasis was obtained by  aluminum chloride.  Estimated blood loss was less than 1 ml.      Discharge Plans:    We will contact the patient with biopsy results in 7-10 days.        Patient Instructions:   Topical antibiotic ointment, Band-Aids, and wound care instructions provided.   Written biopsy wound care instructions provided.  Further treatment plans, if applicable, will be made at that time.         Signed by Minna Merritts MA on 03/12/2011 at 10:51:21            Pathology Results   Site #1:   MIDDLE CHEST JUST R OF MIDLINE     Result #1:   LICHENOID KERATOSIS     Action #1:   BENIGN     Comment:     LVM....03-12-11.Marland KitchenMarland KitchenCLS

## 2011-03-05 NOTE — Unmapped (Addendum)
Signed by Rockwell Germany CNP on 03/05/2011 at 00:00:00  Shave Bx      Imported By: Coletta Memos 03/08/2011 22:28:53    _____________________________________________________________________    External Attachment:    Please see [employer] EMR for this centep[facility]_document_incr.

## 2011-03-05 NOTE — Unmapped (Signed)
Signed by Minna Merritts MA on 03/05/2011 at 14:33:34    Printed Handout:  - Shave Biopsy Consent

## 2011-04-23 NOTE — Unmapped (Signed)
That's fine to fill early this time

## 2011-04-23 NOTE — Unmapped (Signed)
Patient has dropped off her refill at pharmacy for her Adderall but according to their policy, [facility], will not refill it early, as she has 3 days left, but is leaving for vacation tomorrow a.m. And needs it refilled today, and pharmacy is requiring a call from doctor office  To ok this.Mary [name]  @ [phone]

## 2011-04-23 NOTE — Unmapped (Signed)
Pharm informed per dr singh.

## 2011-05-24 ENCOUNTER — Ambulatory Visit: Admit: 2011-05-24 | Payer: PRIVATE HEALTH INSURANCE

## 2011-05-24 DIAGNOSIS — F988 Other specified behavioral and emotional disorders with onset usually occurring in childhood and adolescence: Secondary | ICD-10-CM

## 2011-05-24 MED ORDER — amphetamine-dextroamphetamine (ADDERALL XR) 10 MG 24 hr capsule
10 | ORAL_CAPSULE | Freq: Every morning | ORAL | Status: AC
Start: 2011-05-24 — End: 2011-05-24

## 2011-05-24 MED ORDER — amphetamine-dextroamphetamine (ADDERALL XR) 25 MG 24 hr capsule
25 | ORAL_CAPSULE | Freq: Every morning | ORAL | Status: AC
Start: 2011-05-24 — End: 2011-05-24

## 2011-05-24 MED ORDER — amphetamine-dextroamphetamine (ADDERALL XR) 25 MG 24 hr capsule
25 | ORAL_CAPSULE | Freq: Every morning | ORAL | Status: AC
Start: 2011-05-24 — End: 2011-08-12

## 2011-05-24 MED ORDER — amphetamine-dextroamphetamine (ADDERALL) 10 mg Tab
10 | ORAL_TABLET | Freq: Every day | ORAL | Status: AC
Start: 2011-05-24 — End: 2011-08-12

## 2011-05-24 MED ORDER — amphetamine-dextroamphetamine (ADDERALL XR) 10 MG 24 hr capsule
10 | ORAL_CAPSULE | Freq: Every morning | ORAL | Status: AC
Start: 2011-05-24 — End: 2011-08-12

## 2011-05-24 MED ORDER — amphetamine-dextroamphetamine (ADDERALL) 10 mg Tab
10 | ORAL_TABLET | Freq: Every day | ORAL | Status: AC
Start: 2011-05-24 — End: 2011-05-24

## 2011-05-24 NOTE — Unmapped (Signed)
Subjective  HPI:   Patient ID: Mary [name] is a 40 y.o. female.    Chief Complaint:  HPI Comments: Patient is a 40 y.o. Female who reports to the office for a follow-up regarding her ADHD medication.  She takes Adderall 35mg  XR PO every morning and 10mg  PO daily around 1pm.  She takes the medication everyday "Sunday-Saturday.  She reports that she was trying to wean herself off her medication, but she thinks she needs the afternoon dose.  She states that her attention is much improved compared to when she was weaning off the medication, but she still has some attention difficulties.  She also has some problems attaining to tasks and staying focused, but states that overall, her symptoms of ADHD are much improved.  She realizes that the 45mg dose in the morning was more effective, but she is not interested in increasing her dose again.  Patient reports a side effect of severe fatigue if she doesn't take the medication for a couple days, which is why she takes the medication everyday.  She reports side effects of heart palpitations on occasion and insomnia if she take the medication too late.  She states that her sleep has been fine at night, especially with taking the afternoon dose no later than 1pm.  She reports no other health concerns or questions for today's visit.                        ROS:   Review of Systems       Objective:   Physical Exam   Constitutional: She is oriented to person, place, and time. She appears well-developed and well-nourished.   Cardiovascular: Regular rhythm.    Neurological: She is alert and oriented to person, place, and time.   Psychiatric: She has a normal mood and affect. Her behavior is normal. Judgment and thought content normal.             Filed Vitals:    05/24/11 1125   BP: 118/86   Pulse: 83   Temp: 97.7 ??F (36.5 ??C)   TempSrc: Oral   Height: 5' 6.5 (1.689 m)   Weight: 127 lb (57.607 kg)   SpO2: 98%     Body mass index is 20.19 kg/(m^2).  Body surface area is 1.64 meters  squared.                Assessment/Plan:     11" 44-1155    1. ADD: overall doing well on current dosing; for now will cont on same dosing; RTC 3 months

## 2011-08-12 ENCOUNTER — Ambulatory Visit: Admit: 2011-08-12 | Payer: PRIVATE HEALTH INSURANCE

## 2011-08-12 DIAGNOSIS — R519 Headache, unspecified: Secondary | ICD-10-CM

## 2011-08-12 MED ORDER — montelukast (SINGULAIR) 10 mg tablet
10 | ORAL_TABLET | Freq: Every evening | ORAL | Status: AC
Start: 2011-08-12 — End: 2012-05-23

## 2011-08-12 MED ORDER — amphetamine-dextroamphetamine (ADDERALL XR) 10 MG 24 hr capsule
10 | ORAL_CAPSULE | Freq: Every morning | ORAL | Status: AC
Start: 2011-08-12 — End: 2011-08-12

## 2011-08-12 MED ORDER — fluticasone-salmeterol (ADVAIR DISKUS) 500-50 mcg/dose DsDv
500-50 | Freq: Two times a day (BID) | RESPIRATORY_TRACT | 2.00 refills | 30.00000 days | Status: AC
Start: 2011-08-12 — End: 2013-01-29

## 2011-08-12 MED ORDER — amphetamine-dextroamphetamine (ADDERALL) 10 mg Tab
10 | ORAL_TABLET | Freq: Every day | ORAL | Status: AC
Start: 2011-08-12 — End: 2011-08-12

## 2011-08-12 MED ORDER — amphetamine-dextroamphetamine (ADDERALL XR) 25 MG 24 hr capsule
25 | ORAL_CAPSULE | Freq: Every morning | ORAL | Status: AC
Start: 2011-08-12 — End: 2011-11-19

## 2011-08-12 MED ORDER — amphetamine-dextroamphetamine (ADDERALL XR) 25 MG 24 hr capsule
25 | ORAL_CAPSULE | Freq: Every morning | ORAL | Status: AC
Start: 2011-08-12 — End: 2011-08-12

## 2011-08-12 MED ORDER — amphetamine-dextroamphetamine (ADDERALL XR) 10 MG 24 hr capsule
10 | ORAL_CAPSULE | Freq: Every morning | ORAL | Status: AC
Start: 2011-08-12 — End: 2011-11-19

## 2011-08-12 MED ORDER — albuterol (PROAIR HFA) 90 mcg/actuation inhaler
90 | RESPIRATORY_TRACT | Status: AC | PRN
Start: 2011-08-12 — End: 2011-10-21

## 2011-08-12 MED ORDER — amphetamine-dextroamphetamine (ADDERALL) 10 mg Tab
10 | ORAL_TABLET | Freq: Every day | ORAL | Status: AC
Start: 2011-08-12 — End: 2011-11-19

## 2011-08-12 NOTE — Unmapped (Signed)
Do see Dr Eliot Ford or Dr Alveria Apley about what we talked about

## 2011-08-12 NOTE — Unmapped (Signed)
Subjective  HPI:   Patient ID: Mary [name] is a 40 y.o. female.    Chief Complaint:  HPI         1. l facial discomfort: periorb region, x quite some time  Noticed a vein in this regio seems dilated, feels tender  Denies facial swelling or sinus sx    2. ADD  Doing well c/w meds  Denies any s/e  Needs refills    3. Asthma  Overall doing well c/w meds  Does have to use albuterol fairly frequently in fall b/o allegies    Current Outpatient Prescriptions   Medication Sig Dispense Refill   ??? albuterol (PROAIR HFA) 90 mcg/actuation inhaler Inhale 1-2 puffs into the lungs. q4-6hrs PRN        ??? amphetamine-dextroamphetamine (ADDERALL XR) 10 MG 24 hr capsule Take 1 capsule (10 mg total) by mouth every morning. To be taken with 25mg .  To be filled after ___ days (DEA: [id number])  30 capsule  0   ??? amphetamine-dextroamphetamine (ADDERALL XR) 25 MG 24 hr capsule Take 1 capsule (25 mg total) by mouth every morning. To be filled after ____ days (DEA: [id number])  30 capsule  0   ??? amphetamine-dextroamphetamine (ADDERALL) 10 mg Tab Take 1 tablet (10 mg total) by mouth daily. ~noon or mid afternoon  To be filled after ___ days. (DEA: [id number])  30 tablet  0   ??? fluticasone (FLONASE) 50 mcg/Actuation nasal spray 1-2 sprays by Nasal route daily as needed.         ??? fluticasone-salmeterol (ADVAIR DISKUS) 500-50 mcg/dose DsDv Inhale 1 puff into the lungs 2 (two) times daily.         ??? montelukast (SINGULAIR) 10 mg tablet Take 10 mg by mouth nightly.           Past Medical History   Diagnosis Date   ??? Environmental allergies    ??? Asthma    ??? Chronic back pain    ??? Cervical dysplasia    ??? Anxiety    ??? Depression    ??? ADD (attention deficit disorder)                 ROS:   Review of Systems  As above     Objective:   Physical Exam      NAD  H&N: no cervical LAN or sinus ttp; below and lateral to left eye: there is a s/w prominent venule; slight tender in this area  Psych: A&Ox3; reactive affect; no obv thought d/o     Filed Vitals:       08/12/11 1328   BP: 104/70   Pulse: 66   Temp: 98.2 ??F (36.8 ??C)   Height: 5' 6.5 (1.689 m)   Weight: 131 lb (59.421 kg)   SpO2: 99%     Body mass index is 20.83 kg/(m^2).  Body surface area is 1.67 meters squared.                Assessment/Plan:   132-148    1. Left facial pain- cause isnt clear; refer to ENT  2. ADD- continue w/ current treatment    3. Asthma- continue w/ current treatment ; declines fluvax  rtc 3 months

## 2011-10-21 MED ORDER — albuterol (PROAIR HFA) 90 mcg/actuation inhaler
90 | RESPIRATORY_TRACT | Status: AC | PRN
Start: 2011-10-21 — End: 2012-10-24

## 2011-10-21 NOTE — Unmapped (Signed)
proair  Prescription(s) were electronically prescribed today

## 2011-10-21 NOTE — Unmapped (Signed)
Requesting refill PROAIR  inhaler (200 puffs) 8.5gr, inhale one to 2 puffs every 4-6 hours as needed quanity 8.5, last refill 07/08/11 Rx [phone]

## 2011-10-21 NOTE — Unmapped (Signed)
Lov, lr  08-12-11  ll  10-28-09 tsh

## 2011-10-26 NOTE — Unmapped (Signed)
rx filled on 10/21/2011 with 3 refills

## 2011-10-26 NOTE — Unmapped (Signed)
Requesting refill Proair INH  (200 puffs) 8.5 gm dos ctr, qty 25.5. Inhale 1-2 puffs into the lungs every 4-6 hours as needed for wheezing.    Last refill 10/21/11    Rx [phone]

## 2011-11-19 ENCOUNTER — Ambulatory Visit: Admit: 2011-11-19 | Payer: PRIVATE HEALTH INSURANCE

## 2011-11-19 DIAGNOSIS — F988 Other specified behavioral and emotional disorders with onset usually occurring in childhood and adolescence: Secondary | ICD-10-CM

## 2011-11-19 MED ORDER — amphetamine-dextroamphetamine (ADDERALL XR) 25 MG 24 hr capsule
25 | ORAL_CAPSULE | Freq: Every morning | ORAL | Status: AC
Start: 2011-11-19 — End: 2012-02-22

## 2011-11-19 MED ORDER — amphetamine-dextroamphetamine (ADDERALL) 10 mg Tab
10 | ORAL_TABLET | Freq: Every day | ORAL | Status: AC
Start: 2011-11-19 — End: 2011-11-19

## 2011-11-19 MED ORDER — amphetamine-dextroamphetamine (ADDERALL) 10 mg Tab
10 | ORAL_TABLET | Freq: Every day | ORAL | Status: AC
Start: 2011-11-19 — End: 2012-02-22

## 2011-11-19 MED ORDER — amphetamine-dextroamphetamine (ADDERALL XR) 25 MG 24 hr capsule
25 | ORAL_CAPSULE | Freq: Every morning | ORAL | Status: AC
Start: 2011-11-19 — End: 2011-11-19

## 2011-11-19 MED ORDER — amphetamine-dextroamphetamine (ADDERALL XR) 10 MG 24 hr capsule
10 | ORAL_CAPSULE | Freq: Every morning | ORAL | Status: AC
Start: 2011-11-19 — End: 2011-11-19

## 2011-11-19 MED ORDER — amphetamine-dextroamphetamine (ADDERALL XR) 10 MG 24 hr capsule
10 | ORAL_CAPSULE | Freq: Every morning | ORAL | Status: AC
Start: 2011-11-19 — End: 2012-02-22

## 2011-11-19 NOTE — Unmapped (Signed)
Subjective  HPI:   Patient ID: Mary [name] is a 41 y.o. female.    Chief Complaint:  HPI         The patient's chart was reviewed prior to the visit as part of pre-visit planning    Problems:fu ADD  Doing well c/w meds : occly takes 45mg  in am and i'm much more productive but doesn't want to do too often  Denies any s/e   Needs refills    Past Medical History   Diagnosis Date   ??? Environmental allergies    ??? Asthma    ??? Chronic back pain    ??? Cervical dysplasia    ??? Anxiety    ??? Depression    ??? ADD (attention deficit disorder)        Current Outpatient Prescriptions   Medication Sig Dispense Refill   ??? albuterol (PROAIR HFA) 90 mcg/actuation inhaler Inhale 1-2 puffs into the lungs every 4 hours as needed for Wheezing. q4-6hrs PRN  3 Inhaler  3   ??? amphetamine-dextroamphetamine (ADDERALL XR) 10 MG 24 hr capsule Take 1 capsule (10 mg total) by mouth every morning. To be taken with 25mg .  To be filled after ___ days (DEA: [id number])  30 capsule  0   ??? amphetamine-dextroamphetamine (ADDERALL XR) 25 MG 24 hr capsule Take 1 capsule (25 mg total) by mouth every morning. To be filled after ____ days (DEA: BS [id number])  30 capsule  0   ??? amphetamine-dextroamphetamine (ADDERALL) 10 mg Tab Take 1 tablet (10 mg total) by mouth daily. ~noon or mid afternoon  To be filled after ___ days. (DEA: [id number])  30 tablet  0   ??? fluticasone (FLONASE) 50 mcg/Actuation nasal spray 1-2 sprays by Nasal route daily as needed.         ??? fluticasone-salmeterol (ADVAIR DISKUS) 500-50 mcg/dose DsDv Inhale 1 puff into the lungs 2 times a day.  3 each  3   ??? montelukast (SINGULAIR) 10 mg tablet Take 1 tablet (10 mg total) by mouth at bedtime.  90 tablet  3            ROS:   Review of Systems  as above   Objective:   Physical Exam          Filed Vitals:    11/19/11 1608   BP: 120/78   Pulse: 69   Temp: 97.9 ??F (36.6 ??C)   TempSrc: Oral   Height: 5' 6.5 (1.689 m)   Weight: 136 lb (61.689 kg)   SpO2: 100%     Body mass index is 21.62  kg/(m^2).  Body surface area is 1.70 meters squared.    NAD  Psych: A&Ox3; reactive affect; no obv thought d/o             Assessment/Plan:     Time in: 410  Time out: 418    A/P:  1. ADD- ran oarrs report, no problems; get uds next visit; continue w/ current treatment      RTC: 3 months    New meds:   none  Patient education given  none  Patient verbalized understanding & agreement of the proposed plan.

## 2012-01-20 ENCOUNTER — Ambulatory Visit: Admit: 2012-01-20 | Discharge: 2012-01-20 | Payer: PRIVATE HEALTH INSURANCE | Attending: Family

## 2012-01-20 DIAGNOSIS — B078 Other viral warts: Secondary | ICD-10-CM

## 2012-01-20 NOTE — Unmapped (Signed)
HPI: 41 year old WF here for:    1. Lt dorsal index finger and rt dorsal ring finger and lt cheek with asymptomatic lesions x few months. Has tried OTC wart remover with minimal improvement.   2. Lt shoulder with asymptomatic lesion x ? Duration. Just noticed it a few weeks ago. No known changes.     ROS: Denies fever and chills. Reports seasonal allergies.     Derm History: LOV: 03/05/11  (- ) history of NMSC or MM  (+ ) sunburns easily  (+ ) uses sunscreen  (+ ) history of tanning bed use    Physical Examination: Refused FSE     Examination was performed of the following were unremarkable: conjunctivae/eyelids, gums/teeth/lips and neck    Abnormalities noted include: head/face, LUE and nails/digits    1. Lt dorsal index finger and rt dorsal ring finger and lt cheek with flat topped pink papules  2. Lt shoulder with 4mm round well defined smooth papule.    A & P:    1. Plana verruca x 5  Plan:  -LN2 x 5  Educated about possibility of blister and dyspigmentation. Patient should use vaseline bid until healed.     2. C/w LK  Plan:  -pt refused bx today  -will observe and RTC for any changes

## 2012-01-20 NOTE — Unmapped (Signed)
If lesion blisters- may pop with sterile needle and cover with Vaseline and Bandaid

## 2012-02-18 ENCOUNTER — Ambulatory Visit: Admit: 2012-02-18 | Discharge: 2012-02-18 | Payer: PRIVATE HEALTH INSURANCE | Attending: Family

## 2012-02-18 DIAGNOSIS — B078 Other viral warts: Secondary | ICD-10-CM

## 2012-02-18 NOTE — Unmapped (Signed)
If lesion blisters- may pop with sterile needle and cover with Vaseline and Bandaid

## 2012-02-18 NOTE — Unmapped (Signed)
HPI: 41 year old WF here for follow up on verruca on rt and lt dorsal fingers and cheek. Had them treated with Ln2 at last visit with improvement.     ROS: Denies fever and chills. Reports seasonal allergies.     Derm History: LOV: 01/20/12  (- ) history of NMSC or MM  (+ ) sunburns easily  (+ ) uses sunscreen  (+ ) history of tanning bed use    Physical Examination: Limited     Abnormalities noted include: head/face and nails/digits    Lt cheek with three pink flat topped papules.  Rt dorsal ring finger with one 3mm flat topped papule    A & P:    Plana verruca x 4  Plan:  -LN2 x 4  Educated about possibility of blister and dyspigmentation. Patient should use vaseline bid until healed.   -If lesion blisters- may pop with sterile needle and cover with Vaseline and Bandaid

## 2012-02-22 ENCOUNTER — Ambulatory Visit: Admit: 2012-02-22 | Payer: PRIVATE HEALTH INSURANCE

## 2012-02-22 DIAGNOSIS — Z79899 Other long term (current) drug therapy: Secondary | ICD-10-CM

## 2012-02-22 LAB — 12+OXYCODONE+CRT-UNBUND
Amphetamine, UR: POSITIVE — AB
Barbiturate, UR: NEGATIVE ng/mL
Benzodiazepines UR, 300 ng/mL Cutoff: NEGATIVE ng/mL
Cannabinoid Scrn UR, 20 ng/mL Cutoff: NEGATIVE ng/mL
Cocaine(Metab.)Screen, Urine: NEGATIVE ng/mL
Creatinine, Urine: 80.1 mg/dL (ref 20.0–300.0)
Fentanyl, Ur: NEGATIVE pg/mL
Meperidine: NEGATIVE ng/mL
Methadone, UR: NEGATIVE ng/mL
Opiate, Ur: NEGATIVE ng/mL
Oxycodone/Oxymorphone UR: NEGATIVE ng/mL
Phencyclidine (PCP) UR, 25 ng/mL Cutoff: NEGATIVE ng/mL
Propoxyphene Screen, UR: NEGATIVE ng/mL
Specific Gravity, Urine: 1.017
Tramadol Scrn, Ur: NEGATIVE ng/mL
pH, Urine: 5.8 (ref 4.5–8.9)

## 2012-02-22 MED ORDER — amphetamine-dextroamphetamine (ADDERALL XR) 15 MG 24 hr capsule
15 | ORAL_CAPSULE | Freq: Every morning | ORAL | Status: AC
Start: 2012-02-22 — End: 2012-05-23

## 2012-02-22 MED ORDER — amphetamine-dextroamphetamine (ADDERALL XR) 15 MG 24 hr capsule
15 | ORAL_CAPSULE | Freq: Every morning | ORAL | Status: AC
Start: 2012-02-22 — End: 2012-02-22

## 2012-02-22 NOTE — Unmapped (Signed)
Addended byMelissa Montane on: 02/22/2012 05:03 PM     Modules accepted: Orders

## 2012-02-22 NOTE — Unmapped (Signed)
Subjective  HPI:   Patient ID: Mary [name] is a 41 y.o. female.    Chief Complaint:  HPI         The patient's chart was reviewed prior to the visit as part of pre-visit planning    Problems:fu ADD  Doing well c/w meds : feels concentration is best at  45mg  once daily (xr 25 +10, SA 10)  Denies any s/e   Needs refills    Past Medical History   Diagnosis Date   ??? Environmental allergies    ??? Asthma    ??? Chronic back pain    ??? Cervical dysplasia    ??? Anxiety    ??? Depression    ??? ADD (attention deficit disorder)    ??? Varicella        Current Outpatient Prescriptions   Medication Sig Dispense Refill   ??? albuterol (PROAIR HFA) 90 mcg/actuation inhaler Inhale 1-2 puffs into the lungs every 4 hours as needed for Wheezing. q4-6hrs PRN  3 Inhaler  3   ??? amphetamine-dextroamphetamine (ADDERALL XR) 10 MG 24 hr capsule Take 1 capsule (10 mg total) by mouth every morning. To be taken with 25mg .  To be filled after ___ days (DEA: [id number])  30 capsule  0   ??? amphetamine-dextroamphetamine (ADDERALL XR) 25 MG 24 hr capsule Take 1 capsule (25 mg total) by mouth every morning. To be filled after ____ days (DEA: [id number])  30 capsule  0   ??? fluticasone (FLONASE) 50 mcg/Actuation nasal spray 1-2 sprays by Nasal route daily as needed.         ??? fluticasone-salmeterol (ADVAIR DISKUS) 500-50 mcg/dose DsDv Inhale 1 puff into the lungs 2 times a day.  3 each  3   ??? montelukast (SINGULAIR) 10 mg tablet Take 1 tablet (10 mg total) by mouth at bedtime.  90 tablet  3   ??? DISCONTD: amphetamine-dextroamphetamine (ADDERALL) 10 mg Tab Take 1 tablet (10 mg total) by mouth daily. ~noon or mid afternoon  To be filled after ___ days. (DEA: [id number])  30 tablet  0            ROS:   Review of Systems  as above   Objective:   Physical Exam          Filed Vitals:    02/22/12 1609   BP: 114/78   Pulse: 77   Temp: 99 ??F (37.2 ??C)   Height: 5' 6.5 (1.689 m)   Weight: 131 lb (59.421 kg)   SpO2: 99%     Body mass index is 20.83 kg/(m^2).  Body surface  area is 1.67 meters squared.    NAD  Psych: A&Ox3; reactive affect; no obv thought d/o             Assessment/Plan:     Time in: 422  Time out: 430    A/P:  1. ADD- ran oarrs report last visit, no problems; today uds; continue w/ current treatment  X chg pills to 15mg  XR 3 tabs (same 45mg  daily dosing) to limit copay costs    RTC: 3 months    New meds:   none  Patient education given  none  Patient verbalized understanding & agreement of the proposed plan.

## 2012-05-23 ENCOUNTER — Ambulatory Visit: Admit: 2012-05-23 | Payer: PRIVATE HEALTH INSURANCE

## 2012-05-23 DIAGNOSIS — L239 Allergic contact dermatitis, unspecified cause: Secondary | ICD-10-CM

## 2012-05-23 MED ORDER — predniSONE (DELTASONE) 20 MG tablet
20 | ORAL_TABLET | ORAL | Status: AC
Start: 2012-05-23 — End: 2012-08-28

## 2012-05-23 MED ORDER — dextroamphetamine-amphetamine (ADDERALL XR) 15 MG 24 hr capsule
15 | ORAL_CAPSULE | Freq: Every morning | ORAL | Status: AC
Start: 2012-05-23 — End: 2012-08-28

## 2012-05-23 MED ORDER — dextroamphetamine-amphetamine (ADDERALL XR) 15 MG 24 hr capsule
15 | ORAL_CAPSULE | Freq: Every morning | ORAL | Status: AC
Start: 2012-05-23 — End: 2012-05-23

## 2012-05-23 MED ORDER — montelukast (SINGULAIR) 10 mg tablet
10 | ORAL_TABLET | Freq: Every evening | ORAL | Status: AC
Start: 2012-05-23 — End: 2014-03-13

## 2012-05-23 MED ORDER — fluticasone (FLONASE) 50 mcg/actuation nasal spray
50 | Freq: Every day | NASAL | Status: AC | PRN
Start: 2012-05-23 — End: 2015-08-06

## 2012-05-23 NOTE — Unmapped (Signed)
Subjective  HPI:   Patient ID: Mary [name] is a 41 y.o. female.    Chief Complaint:  HPI         The patient's chart was reviewed prior to the visit as part of pre-visit planning    Problems:fu ADD  Doing well c/w meds : feels concentration is best at  45mg  once daily its dong fine  Denies any s/e but does have rash diffusely (began torso, arms) x 1 week + , similar rash last @ this time, v itchy  Some nasal congestion; chest feels a little tight  Needs refills    Past Medical History   Diagnosis Date   ??? Environmental allergies    ??? Asthma    ??? Chronic back pain    ??? Cervical dysplasia    ??? Anxiety    ??? Depression    ??? ADD (attention deficit disorder)    ??? Varicella        Current Outpatient Prescriptions   Medication Sig Dispense Refill   ??? albuterol (PROAIR HFA) 90 mcg/actuation inhaler Inhale 1-2 puffs into the lungs every 4 hours as needed for Wheezing. q4-6hrs PRN  3 Inhaler  3   ??? amphetamine-dextroamphetamine (ADDERALL XR) 15 MG 24 hr capsule Take 3 capsules (45 mg total) by mouth every morning. To be filled after ___ days  90 capsule  0   ??? fluticasone (FLONASE) 50 mcg/Actuation nasal spray 1-2 sprays by Nasal route daily as needed.         ??? fluticasone-salmeterol (ADVAIR DISKUS) 500-50 mcg/dose DsDv Inhale 1 puff into the lungs 2 times a day.  3 each  3   ??? montelukast (SINGULAIR) 10 mg tablet Take 1 tablet (10 mg total) by mouth at bedtime.  90 tablet  3            ROS:   Review of Systems  as above   Objective:   Physical Exam          Filed Vitals:    05/23/12 1334   Weight: 132 lb (59.875 kg)     There is no height on file to calculate BMI.  There is no height on file to calculate BSA.    NAD  Chest: LCTAB  CVS: PPP; min spider veins  Skin: diffuse min erythem macular rash  Psych: A&Ox3; reactive affect; no obv thought d/o             Assessment/Plan:     Time in: 134  Time out: 150    A/P:  1. ADD- ran oarrs report 6 months ago, no problems;  uds done last visit w no red flags; continue w/  current treatment   2. Rash: dermaitis, ? R/t allergies; trial w prednisone    RTC: 3 months    New meds:   none  Patient education given  none  Patient verbalized understanding & agreement of the proposed plan.

## 2012-05-23 NOTE — Unmapped (Signed)
Aveeno lotion and bath as needed for the itching  Zyrtec at bedtime may also be helpful  Prednisone: always take w/ food and watch for the stomach side effects

## 2012-08-28 ENCOUNTER — Ambulatory Visit: Admit: 2012-08-28 | Payer: PRIVATE HEALTH INSURANCE

## 2012-08-28 DIAGNOSIS — J45909 Unspecified asthma, uncomplicated: Secondary | ICD-10-CM

## 2012-08-28 MED ORDER — dextroamphetamine-amphetamine (ADDERALL XR) 15 MG 24 hr capsule
15 | ORAL_CAPSULE | Freq: Every morning | ORAL | Status: AC
Start: 2012-08-28 — End: 2012-08-28

## 2012-08-28 MED ORDER — dextroamphetamine-amphetamine (ADDERALL XR) 15 MG 24 hr capsule
15 | ORAL_CAPSULE | Freq: Every morning | ORAL | Status: AC
Start: 2012-08-28 — End: 2012-11-24

## 2012-08-28 NOTE — Unmapped (Signed)
Mary [name] is a 41 y.o. female (DOB: 1970/10/24)    This chart was reviewed as part of pre-visit planning prior to the office visit.    CC:   Chief Complaint   Patient presents with   ??? Medication Refill     adderall.     HPI:   ADD-overall doing well on Adderall.  Feels like this dose does not work as well as it used to, but not to the point that she would want to increase anything.  The significant side effects.    Asthma-severe, on Advair and Singulair, has rescue inhaler.  No history of smoking.  At some point in the past she had a prescription for Spiriva that she used for a short time and it seemed to help as well.  Wondering about this. No recent significant flares, however during the fall And winter she is usually worse.    Review of Systems:  No chest pain, shortness of breath  Mild wheezing, controlled with medication currently  No abdominal pain    Immunization History   Administered Date(s) Administered   ??? influenza unspec 05/31/2008       Lab Results   Component Value Date    HMPAP neg 08/24/2007        I have reviewed the patient's medical history in detail and updated the computerized patient record.     No Known Allergies    Current Outpatient Prescriptions on File Prior to Visit   Medication Sig Dispense Refill   ??? albuterol (PROAIR HFA) 90 mcg/actuation inhaler Inhale 1-2 puffs into the lungs every 4 hours as needed for Wheezing. q4-6hrs PRN  3 Inhaler  3   ??? aspirin 81 MG EC tablet Take 81 mg by mouth daily. MWF       ??? fluticasone (FLONASE) 50 mcg/actuation nasal spray 1-2 sprays by Nasal route daily as needed.  16 g  11   ??? fluticasone-salmeterol (ADVAIR DISKUS) 500-50 mcg/dose DsDv Inhale 1 puff into the lungs 2 times a day.  3 each  3   ??? montelukast (SINGULAIR) 10 mg tablet Take 1 tablet (10 mg total) by mouth at bedtime.  90 tablet  3   ??? [DISCONTINUED] dextroamphetamine-amphetamine (ADDERALL XR) 15 MG 24 hr capsule Take 3 capsules (45 mg total) by mouth every morning. To be filled after  ___ days  90 capsule  0   ??? [DISCONTINUED] predniSONE (DELTASONE) 20 MG tablet 1 by mouth twice daily x 3 days then 1 by mouth once daily x 4 days  30 tablet  0     No current facility-administered medications on file prior to visit.         Physical Exam:  Pulse 73   Temp(Src) 98.3 ??F (36.8 ??C) (Oral)   Ht 5' 6.5 (1.689 m)   Wt 125 lb 14.4 oz (57.108 kg)   BMI 20.02 kg/m2   SpO2 98%  General appearance: alert, appears stated age, cooperative and no distress  Head: Normocephalic, without obvious abnormality, atraumatic  Eyes: conjunctivae/corneas clear.   Lungs: clear to auscultation bilaterally and no respiratory distress  Heart: regular rate and rhythm, S1, S2 normal, no murmur, click, rub or gallop  Extremities: extremities normal, atraumatic, no cyanosis or edema  Skin: Skin color, texture, turgor normal.   Neurologic: Mental status: Alert, oriented, thought content appropriate  Gait: Normal  Psychiatric: Alert, oriented, normal reactive affect, normal speech, good eye contact      Assessment/Plan:  ADD-stable.  Refills x3 months  Asthma-severe, persistent.  Discussion with patient today about Spiriva being a possible medication given her asthma symptoms, however at this level with severe asthma and using an off label medication, would recommend referral to pulmonology.  She is a little hesitant because she doesn't want to admit her symptoms are that bad, but agrees that that is what she would need.    Follow up: No Follow-up on file.3 months with Dr. Thedore Mins    Reviewed medications with the patient and she understands how to take them and has no barriers to medication compliance.

## 2012-08-28 NOTE — Unmapped (Signed)
Please call to schedule your appointment:    [facility]: [phone] - Dr Damaris Hippo

## 2012-10-24 MED ORDER — albuterol (PROAIR HFA) 90 mcg/actuation inhaler
90 | RESPIRATORY_TRACT | Status: AC | PRN
Start: 2012-10-24 — End: 2013-02-28

## 2012-10-24 NOTE — Unmapped (Signed)
Refill request for proair inh.    LOV 08-28-2012  LR 10-21-2011 #3 x 3    [facility], [address]

## 2012-10-24 NOTE — Unmapped (Signed)
Prescription(s) were electronically prescribed today

## 2012-11-24 ENCOUNTER — Ambulatory Visit: Admit: 2012-11-24 | Payer: PRIVATE HEALTH INSURANCE

## 2012-11-24 DIAGNOSIS — F988 Other specified behavioral and emotional disorders with onset usually occurring in childhood and adolescence: Secondary | ICD-10-CM

## 2012-11-24 MED ORDER — dextroamphetamine-amphetamine (ADDERALL XR) 15 MG 24 hr capsule
15 | ORAL_CAPSULE | Freq: Every morning | ORAL | Status: AC
Start: 2012-11-24 — End: 2012-11-24

## 2012-11-24 MED ORDER — dextroamphetamine-amphetamine (ADDERALL XR) 15 MG 24 hr capsule
15 | ORAL_CAPSULE | Freq: Every morning | ORAL | Status: AC
Start: 2012-11-24 — End: 2013-02-28

## 2012-11-24 NOTE — Unmapped (Signed)
Subjective  HPI:   Patient ID: Mary [name] is a 42 y.o. female.    Chief Complaint:  HPI         The patient's chart was reviewed prior to the visit as part of pre-visit planning    Problems:fu ADD  Doing well c/w meds : feels concentration is best at  45mg  once daily  but i've developed some tolerance w it  Denies any s/e   Needs refills    Past Medical History   Diagnosis Date   ??? Environmental allergies    ??? Asthma    ??? Chronic back pain    ??? Cervical dysplasia    ??? Anxiety    ??? Depression    ??? ADD (attention deficit disorder)    ??? Varicella        Current Outpatient Prescriptions   Medication Sig Dispense Refill   ??? albuterol (PROAIR HFA) 90 mcg/actuation inhaler Inhale 1-2 puffs into the lungs every 4 hours as needed for Wheezing. q4-6hrs PRN  3 Inhaler  3   ??? aspirin 81 MG EC tablet Take 81 mg by mouth daily. MWF       ??? dextroamphetamine-amphetamine (ADDERALL XR) 15 MG 24 hr capsule Take 3 capsules (45 mg total) by mouth every morning. To be filled after ___ days  90 capsule  0   ??? fluticasone (FLONASE) 50 mcg/actuation nasal spray 1-2 sprays by Nasal route daily as needed.  16 g  11   ??? fluticasone-salmeterol (ADVAIR DISKUS) 500-50 mcg/dose DsDv Inhale 1 puff into the lungs 2 times a day.  3 each  3   ??? montelukast (SINGULAIR) 10 mg tablet Take 1 tablet (10 mg total) by mouth at bedtime.  90 tablet  3     No current facility-administered medications for this visit.       NAD  Psych: A&Ox3; reactive affect; no obv thought d/o      ROS:   Review of Systems  as above   Objective:   Physical Exam          Filed Vitals:    11/24/12 1637   BP: 118/90   Pulse: 78   Temp: 97.9 ??F (36.6 ??C)   TempSrc: Oral   Height: 5' 6 (1.676 m)   Weight: 132 lb (59.875 kg)   SpO2: 98%     Body mass index is 21.32 kg/(m^2).  Body surface area is 1.67 meters squared.    NAD  Psych: A&Ox3; reactive affect; no obv thought d/o             Assessment/Plan:     Time in: 435  Time out:     A/P:  1. ADD- ran oarrs report yesterday,  no problems; uds due in summer; continue w/ current treatment ; as developing some tolerance can consider psych referral    RTC: 3 months    New meds:   none  Patient education given  none  Patient verbalized understanding & agreement of the proposed plan.

## 2012-12-04 NOTE — Unmapped (Signed)
LM for pt to call and schedule an appt with Dr. Damaris Hippo, referral from Dr. Rozell Searing

## 2013-01-29 MED ORDER — fluticasone-salmeterol (ADVAIR DISKUS) 500-50 mcg/dose DsDv
500-50 | Freq: Two times a day (BID) | RESPIRATORY_TRACT | 2.00 refills | 30.00000 days | Status: AC
Start: 2013-01-29 — End: 2013-02-28

## 2013-01-29 NOTE — Unmapped (Signed)
Prescription(s) were electronically prescribed today  Don't forget to sign up for my [facility] the patient portal: can use to check results, send messages etc;  See the summary sheet from the last visit for details

## 2013-01-29 NOTE — Unmapped (Signed)
Advair Diskus 500/50 mcg  LR- 08/12/2011 #3x3  LOV- 11/24/12  Lab Results   Component Value Date    TSH 1.84 10/28/2009

## 2013-01-29 NOTE — Unmapped (Signed)
Message left message to return call to office to make aware of below refill

## 2013-01-30 NOTE — Unmapped (Signed)
Mary [name] was made aware of Advair being sent to her pharmacy.

## 2013-02-28 ENCOUNTER — Ambulatory Visit: Admit: 2013-02-28 | Payer: PRIVATE HEALTH INSURANCE

## 2013-02-28 DIAGNOSIS — J45909 Unspecified asthma, uncomplicated: Secondary | ICD-10-CM

## 2013-02-28 MED ORDER — albuterol (PROAIR HFA) 90 mcg/actuation inhaler
90 | RESPIRATORY_TRACT | Status: AC | PRN
Start: 2013-02-28 — End: 2014-03-13

## 2013-02-28 MED ORDER — dextroamphetamine-amphetamine (ADDERALL XR) 15 MG 24 hr capsule
15 | ORAL_CAPSULE | Freq: Every morning | ORAL | Status: AC
Start: 2013-02-28 — End: 2013-02-28

## 2013-02-28 MED ORDER — dextroamphetamine-amphetamine (ADDERALL XR) 15 MG 24 hr capsule
15 | ORAL_CAPSULE | Freq: Every morning | ORAL | Status: AC
Start: 2013-02-28 — End: 2013-05-31

## 2013-02-28 MED ORDER — budesonide-formoterol (SYMBICORT) 160-4.5 mcg/actuation inhaler
160-4.5 | Freq: Two times a day (BID) | RESPIRATORY_TRACT | Status: AC
Start: 2013-02-28 — End: 2014-03-13

## 2013-02-28 NOTE — Unmapped (Signed)
Subjective  HPI:   Patient ID: Mary [name] is a 42 y.o. female.    Chief Complaint:  HPI         The patient's chart was reviewed prior to the visit as part of pre-visit planning    Problems:fu ADD, asthma  Doing well c/w meds : feels concentration is best w adderall @   45mg  once daily  but i've developed some tolerance w it, doesn't work as well all of the time; recent labs w gyn: entering perimenopause: recently started progesterone, thinking about estrogen  Has done well w advair w seasonal variation, will need to chg b/o formualry  Denies any s/e   Needs refills    Past Medical History   Diagnosis Date   ??? Environmental allergies    ??? Asthma    ??? Chronic back pain    ??? Cervical dysplasia    ??? Anxiety    ??? Depression    ??? ADD (attention deficit disorder)    ??? Varicella        Current Outpatient Prescriptions   Medication Sig Dispense Refill   ??? albuterol (PROAIR HFA) 90 mcg/actuation inhaler Inhale 1-2 puffs into the lungs every 4 hours as needed for Wheezing. q4-6hrs PRN  3 Inhaler  3   ??? dextroamphetamine-amphetamine (ADDERALL XR) 15 MG 24 hr capsule Take 3 capsules (45 mg total) by mouth every morning. To be filled after ___ days  90 capsule  0   ??? fluticasone (FLONASE) 50 mcg/actuation nasal spray 1-2 sprays by Nasal route daily as needed.  16 g  11   ??? montelukast (SINGULAIR) 10 mg tablet Take 1 tablet (10 mg total) by mouth at bedtime.  90 tablet  3   ??? [DISCONTINUED] albuterol (PROAIR HFA) 90 mcg/actuation inhaler Inhale 1-2 puffs into the lungs every 4 hours as needed for Wheezing. q4-6hrs PRN  3 Inhaler  3   ??? [DISCONTINUED] dextroamphetamine-amphetamine (ADDERALL XR) 15 MG 24 hr capsule Take 3 capsules (45 mg total) by mouth every morning. To be filled after ___ days  90 capsule  0   ??? [DISCONTINUED] dextroamphetamine-amphetamine (ADDERALL XR) 15 MG 24 hr capsule Take 3 capsules (45 mg total) by mouth every morning. To be filled after ___ days  90 capsule  0   ??? [DISCONTINUED]  dextroamphetamine-amphetamine (ADDERALL XR) 15 MG 24 hr capsule Take 3 capsules (45 mg total) by mouth every morning. To be filled after ___ days  90 capsule  0   ??? [DISCONTINUED] fluticasone-salmeterol (ADVAIR DISKUS) 500-50 mcg/dose DsDv Inhale 1 puff into the lungs 2 times a day.  3 each  3   ??? budesonide-formoterol (SYMBICORT) 160-4.5 mcg/actuation inhaler Inhale 2 puffs into the lungs 2 times a day.  3 Inhaler  3   ??? [DISCONTINUED] aspirin 81 MG EC tablet Take 81 mg by mouth daily. MWF         No current facility-administered medications for this visit.       NAD  Psych: A&Ox3; reactive affect; no obv thought d/o      ROS:   Review of Systems  as above   Objective:   Physical Exam          Filed Vitals:    02/28/13 0903   BP: 120/84   Pulse: 76   Temp: 98.5 ??F (36.9 ??C)   TempSrc: Oral   Height: 5' 6.5 (1.689 m)   Weight: 131 lb (59.421 kg)   SpO2: 97%     Body  mass index is 20.83 kg/(m^2).  Body surface area is 1.67 meters squared.    NAD  Psych: A&Ox3; reactive affect; no obv thought d/o             Assessment/Plan:     Time in: 905  Time out: 923    18 mins, Greater than 50% of time spent in face to face counselling/coordination of care      A/P:  1. ADD- ran oarrs report in Feb: no problems; uds sent today;  continue w/ current treatment ; as developing some tolerance can consider psych referral  2. Asthma: b/o formulary chg to symbicort; refill albuterol    RTC: 3 months    New meds:   none  Patient education given  none  Patient verbalized understanding & agreement of the proposed plan.

## 2013-05-31 ENCOUNTER — Ambulatory Visit: Admit: 2013-05-31 | Payer: PRIVATE HEALTH INSURANCE

## 2013-05-31 DIAGNOSIS — J45909 Unspecified asthma, uncomplicated: Secondary | ICD-10-CM

## 2013-05-31 MED ORDER — dextroamphetamine-amphetamine (ADDERALL XR) 15 MG 24 hr capsule
15 | ORAL_CAPSULE | Freq: Every morning | ORAL | Status: AC
Start: 2013-05-31 — End: 2013-05-31

## 2013-05-31 MED ORDER — dextroamphetamine-amphetamine (ADDERALL XR) 15 MG 24 hr capsule
15 | ORAL_CAPSULE | Freq: Every morning | ORAL | Status: AC
Start: 2013-05-31 — End: 2013-09-03

## 2013-05-31 MED ORDER — predniSONE (DELTASONE) 20 MG tablet
20 | ORAL_TABLET | ORAL | Status: AC
Start: 2013-05-31 — End: 2013-06-07

## 2013-05-31 NOTE — Unmapped (Signed)
Subjective  HPI:   Patient ID: Mary [name] is a 42 y.o. female.    Chief Complaint:  HPI         The patient's chart was reviewed prior to the visit as part of pre-visit planning  Chief Complaint   Patient presents with   ??? Medication Refill     Adderall      Problems:fu ADD, asthma  Doing well c/w meds : feels concentration is best w adderall @   45mg  once daily : has developed some tolerance w it, doesn't work as well all of the time  Denies any s/e   Needs refills    Past Medical History   Diagnosis Date   ??? Environmental allergies    ??? Asthma    ??? Chronic back pain    ??? Cervical dysplasia    ??? Anxiety    ??? Depression    ??? ADD (attention deficit disorder)    ??? Varicella        Current Outpatient Prescriptions   Medication Sig Dispense Refill   ??? albuterol (PROAIR HFA) 90 mcg/actuation inhaler Inhale 1-2 puffs into the lungs every 4 hours as needed for Wheezing. q4-6hrs PRN  3 Inhaler  3   ??? budesonide-formoterol (SYMBICORT) 160-4.5 mcg/actuation inhaler Inhale 2 puffs into the lungs 2 times a day.  3 Inhaler  3   ??? dextroamphetamine-amphetamine (ADDERALL XR) 15 MG 24 hr capsule Take 3 capsules (45 mg total) by mouth every morning. To be filled after ___ days  90 capsule  0   ??? fluticasone (FLONASE) 50 mcg/actuation nasal spray 1-2 sprays by Nasal route daily as needed.  16 g  11   ??? montelukast (SINGULAIR) 10 mg tablet Take 1 tablet (10 mg total) by mouth at bedtime.  90 tablet  3     No current facility-administered medications for this visit.       NAD  Psych: A&Ox3; reactive affect; no obv thought d/o      ROS:   Review of Systems  as above   Objective:   Physical Exam      Wt Readings from Last 3 Encounters:   05/31/13 132 lb (59.875 kg)   02/28/13 131 lb (59.421 kg)   11/24/12 132 lb (59.875 kg)         Filed Vitals:    05/31/13 1515   BP: 128/76   Pulse: 62   Temp: 97.8 ??F (36.6 ??C)   Height: 5' 6.5 (1.689 m)   Weight: 132 lb (59.875 kg)   SpO2: 99%     Body mass index is 20.99 kg/(m^2).  Body surface  area is 1.68 meters squared.    NAD  LCTAB  Psych: A&Ox3; reactive affect; no obv thought d/o             Assessment/Plan:     Time in: 321  Time out: 329    10 mins, Greater than 50% of time spent in face to face counselling/coordination of care      A/P:  1. ADD- ran oarrs report in Feb: no problems; uds sent last visit;  continue w/ current treatment ; as developing some tolerance shd get psych referral  2. Asthma: cont symbicort & albuterol; safety net predn rx    RTC: 3 months    New meds:   none  Patient education given  none  Patient verbalized understanding & agreement of the proposed plan.

## 2013-09-03 ENCOUNTER — Ambulatory Visit: Admit: 2013-09-03 | Payer: PRIVATE HEALTH INSURANCE

## 2013-09-03 DIAGNOSIS — F988 Other specified behavioral and emotional disorders with onset usually occurring in childhood and adolescence: Secondary | ICD-10-CM

## 2013-09-03 MED ORDER — dextroamphetamine-amphetamine (ADDERALL XR) 15 MG 24 hr capsule
15 | ORAL_CAPSULE | Freq: Every morning | ORAL | Status: AC
Start: 2013-09-03 — End: 2013-11-19

## 2013-09-03 MED ORDER — dextroamphetamine-amphetamine (ADDERALL XR) 15 MG 24 hr capsule
15 | ORAL_CAPSULE | Freq: Every morning | ORAL | Status: AC
Start: 2013-09-03 — End: 2013-09-03

## 2013-09-03 NOTE — Unmapped (Signed)
Subjective  HPI:   Patient ID: Mary [name] is a 42 y.o. female.    Chief Complaint:  HPI         The patient's chart was reviewed prior to the visit as part of pre-visit planning  Chief Complaint   Patient presents with   ??? ADHD     Adderral refill      Problems:fu ADD  Doing well c/w meds : feels concentration is best w adderall @ 45mg  once daily : has developed some tolerance w it, doesn't work as well all of the time  Denies any s/e   Needs refills    Past Medical History   Diagnosis Date   ??? Environmental allergies    ??? Asthma    ??? Chronic back pain    ??? Cervical dysplasia    ??? Anxiety    ??? Depression    ??? ADD (attention deficit disorder)    ??? Varicella        Current Outpatient Prescriptions   Medication Sig Dispense Refill   ??? albuterol (PROAIR HFA) 90 mcg/actuation inhaler Inhale 1-2 puffs into the lungs every 4 hours as needed for Wheezing. q4-6hrs PRN  3 Inhaler  3   ??? budesonide-formoterol (SYMBICORT) 160-4.5 mcg/actuation inhaler Inhale 2 puffs into the lungs 2 times a day.  3 Inhaler  3   ??? dextroamphetamine-amphetamine (ADDERALL XR) 15 MG 24 hr capsule Take 3 capsules (45 mg total) by mouth every morning. To be filled after ___ days  90 capsule  0   ??? fluticasone (FLONASE) 50 mcg/actuation nasal spray 1-2 sprays by Nasal route daily as needed.  16 g  11   ??? montelukast (SINGULAIR) 10 mg tablet Take 1 tablet (10 mg total) by mouth at bedtime.  90 tablet  3     No current facility-administered medications for this visit.       NAD  Psych: A&Ox3; reactive affect; no obv thought d/o      ROS:   Review of Systems  as above   Objective:   Physical Exam      Wt Readings from Last 3 Encounters:   09/03/13 135 lb (61.236 kg)   05/31/13 132 lb (59.875 kg)   02/28/13 131 lb (59.421 kg)         Filed Vitals:    09/03/13 1211   BP: 120/86   Pulse: 82   Temp: 98.3 ??F (36.8 ??C)   Height: 5' 6.5 (1.689 m)   Weight: 135 lb (61.236 kg)   SpO2: 98%     Body mass index is 21.47 kg/(m^2).  Body surface area is 1.69  meters squared.    NAD  Psych: A&Ox3; reactive affect; no obv thought d/o             Assessment/Plan:     Time in: 1225  Time out: 1235    10 mins, Greater than 50% of time spent in face to face counselling/coordination of care      A/P:  1. ADD- ran oarrs report in Feb: no problems; uds sent last visit;  continue w/ current treatment ; as developing some tolerance had d/w pt re psych referral    RTC: 3 months    New meds:   none  Patient education given  none  Patient verbalized understanding & agreement of the proposed plan.

## 2013-11-19 ENCOUNTER — Ambulatory Visit: Admit: 2013-11-19 | Payer: PRIVATE HEALTH INSURANCE

## 2013-11-19 DIAGNOSIS — J45909 Unspecified asthma, uncomplicated: Secondary | ICD-10-CM

## 2013-11-19 LAB — POCT INFLUENZA A/B
Rapid Influenza A Ag: NEGATIVE
Rapid Influenza A Ag: NEGATIVE
Rapid Influenza B Ag: NEGATIVE
Rapid Influenza B Ag: NEGATIVE

## 2013-11-19 MED ORDER — dextroamphetamine-amphetamine (ADDERALL XR) 15 MG 24 hr capsule
15 | ORAL_CAPSULE | Freq: Every morning | ORAL | Status: AC
Start: 2013-11-19 — End: 2014-03-13

## 2013-11-19 MED ORDER — dextroamphetamine-amphetamine (ADDERALL XR) 15 MG 24 hr capsule
15 | ORAL_CAPSULE | Freq: Every morning | ORAL | Status: AC
Start: 2013-11-19 — End: 2013-11-19

## 2013-11-19 MED ORDER — predniSONE (DELTASONE) 20 MG tablet
20 | ORAL_TABLET | ORAL | Status: AC
Start: 2013-11-19 — End: 2013-11-26

## 2013-11-19 MED ORDER — azithromycin (ZITHROMAX) 250 MG tablet
250 | ORAL_TABLET | ORAL | Status: AC
Start: 2013-11-19 — End: 2013-11-26

## 2013-11-19 NOTE — Unmapped (Signed)
Addended by: Barbie Banner on: 11/19/2013 02:50 PM     Modules accepted: Orders

## 2013-11-19 NOTE — Unmapped (Signed)
The patient's chart was reviewed prior to this ACUTE VISIT as part of pre-visit planning    Today complaining of:flu  Symptoms have been going on for:1 week: sx began w lungs felt scratchy: cough: min sputum, chest feels tight  Also complains ZO:XWRUE congestion, sinus pressure, some dib: diboe+, sinus congestion  Denies:fever  Treatments tried so far include:albuterol 8x yesterday  hasnt had asthma attack in several years    Past Medical History   Diagnosis Date   ??? Environmental allergies    ??? Asthma    ??? Chronic back pain    ??? Cervical dysplasia    ??? Anxiety    ??? Depression    ??? ADD (attention deficit disorder)    ??? Varicella         Current Outpatient Prescriptions   Medication Sig Dispense Refill   ??? albuterol (PROAIR HFA) 90 mcg/actuation inhaler Inhale 1-2 puffs into the lungs every 4 hours as needed for Wheezing. q4-6hrs PRN  3 Inhaler  3   ??? budesonide-formoterol (SYMBICORT) 160-4.5 mcg/actuation inhaler Inhale 2 puffs into the lungs 2 times a day.  3 Inhaler  3   ??? dextroamphetamine-amphetamine (ADDERALL XR) 15 MG 24 hr capsule Take 3 capsules (45 mg total) by mouth every morning. To be filled after ___ days  90 capsule  0   ??? fluticasone (FLONASE) 50 mcg/actuation nasal spray 1-2 sprays by Nasal route daily as needed.  16 g  11   ??? montelukast (SINGULAIR) 10 mg tablet Take 1 tablet (10 mg total) by mouth at bedtime.  90 tablet  3     No current facility-administered medications for this visit.       Review of systems:  As above    On Exam:  Filed Vitals:    11/19/13 1427   Temp: 98.9 ??F (37.2 ??C)       Wt Readings from Last 3 Encounters:   09/03/13 135 lb (61.236 kg)   05/31/13 132 lb (59.875 kg)   02/28/13 131 lb (59.421 kg)       NAD  H&N: mild max sinus TTP; no cervical LAN; mild pharyngitis w/o exudate    Chest: B soft I/E wheezes  Psych: A&Ox3; reactive affect; no obv thought d/o       No results found for this basename: HGBA1C          Time in: 225  Time out:240    A/P:  1. Viral illness w exacn  v asthma: prednisone; cont albuterol prn  2. ADD: needs refill adderall, doing well    RTC:3 months adderall refill + well exam    New meds/Patient education given  : see AVS  Patient verbalized understanding & agreement of the proposed plan.

## 2013-11-19 NOTE — Unmapped (Signed)
Make sure to get enough rest/sleep  Make sure you are drinking plenty of water    saline sinus irrigation with the NETI POT or OCEAN MIST etc 3-4 times daily   Do salt water gargles 3-4 times per day for your sore throat   MUCINEX as needed for cough w/ sputum   AFRIN nasal spray (for 2-3 days only!!) can help with the nasal congestion    Tylenol 650mg  by mouth three times a day as needed for pain or fever/body aches   Prednisone: always take w/ food and watch for the stomach side effects       safety net antibiotics: fill only if your symptoms progress over next 3-5 days.     Call for a follow up appointment if you have worsening of your symptoms or if you are just not getting better over the next 1-2 weeks    You may get a call or mailing about how satisfied you were with the care you received today. Please do take the time to give [place] your input. It will help [place] to improve the care you and others receive in the future. We thank you in advance for doing this!

## 2014-03-13 ENCOUNTER — Ambulatory Visit: Admit: 2014-03-13 | Payer: PRIVATE HEALTH INSURANCE

## 2014-03-13 DIAGNOSIS — J4540 Moderate persistent asthma, uncomplicated: Secondary | ICD-10-CM

## 2014-03-13 MED ORDER — montelukast (SINGULAIR) 10 mg tablet
10 | ORAL_TABLET | Freq: Every evening | ORAL | Status: AC
Start: 2014-03-13 — End: 2015-02-17

## 2014-03-13 MED ORDER — albuterol (PROAIR HFA) 90 mcg/actuation inhaler
90 | RESPIRATORY_TRACT | Status: AC | PRN
Start: 2014-03-13 — End: 2015-02-17

## 2014-03-13 MED ORDER — dextroamphetamine-amphetamine (ADDERALL XR) 15 MG 24 hr capsule
15 | ORAL_CAPSULE | Freq: Every morning | ORAL | Status: AC
Start: 2014-03-13 — End: 2014-03-13

## 2014-03-13 MED ORDER — dextroamphetamine-amphetamine (ADDERALL XR) 15 MG 24 hr capsule
15 | ORAL_CAPSULE | Freq: Every morning | ORAL | Status: AC
Start: 2014-03-13 — End: 2014-06-11

## 2014-03-13 MED ORDER — budesonide-formoterol (SYMBICORT) 160-4.5 mcg/actuation inhaler
160-4.5 | Freq: Two times a day (BID) | RESPIRATORY_TRACT | Status: AC
Start: 2014-03-13 — End: 2015-02-17

## 2014-03-13 NOTE — Unmapped (Addendum)
See if the insurance company will cover the Advair  Asthma not as well controlled w recent chg from advair (advair 500) to symbicort   Albuterol: needs to use every day, nocturnal awakening 4x past 2 weeks   also affect ability to do things during the day    If you haven't had your annual well exam in the past 12 months do call to set this up.     Our records also indicate that you need to schedule for:      ___ Pap smear (schedule for a wellness visit: [phone])    ___ Mammogram: call [phone]    ___ Screening colonoscopy: call [phone]

## 2014-03-13 NOTE — Unmapped (Signed)
Subjective  HPI:   Patient ID: Grisel Blumenstock is a 42 y.o. female.    Chief Complaint:  HPI         The patient's chart was reviewed prior to the visit as part of pre-visit planning  Chief Complaint   Patient presents with   ??? Medication Refill      Problems:fu ADD, asthma  Asthma not as well controlled w recent chg from advair (advair 500) to symbicort   Albuterol: needs to use every day, nocturnal awakening 4x past 2 weeks   Sx also affect ability to do things during the day  Past singulair use: unsure how effective  Doing well c/w meds : feels concentration is best w adderall @ 45mg  once daily : has developed some tolerance w it, doesn't work as well all of the time  Denies any s/e   Needs refills    Past Medical History   Diagnosis Date   ??? Environmental allergies    ??? Asthma    ??? Chronic back pain    ??? Cervical dysplasia    ??? Anxiety    ??? Depression    ??? ADD (attention deficit disorder)    ??? Varicella        Current Outpatient Prescriptions   Medication Sig Dispense Refill   ??? albuterol (PROAIR HFA) 90 mcg/actuation inhaler Inhale 1-2 puffs into the lungs every 4 hours as needed for Wheezing. q4-6hrs PRN  3 Inhaler  3   ??? budesonide-formoterol (SYMBICORT) 160-4.5 mcg/actuation inhaler Inhale 2 puffs into the lungs 2 times a day.  3 Inhaler  3   ??? dextroamphetamine-amphetamine (ADDERALL XR) 15 MG 24 hr capsule Take 3 capsules (45 mg total) by mouth every morning. To be filled after ___ days  90 capsule  0   ??? fluticasone (FLONASE) 50 mcg/actuation nasal spray 1-2 sprays by Nasal route daily as needed.  16 g  11     No current facility-administered medications for this visit.       NAD  LCTAB  RRR, no murmur  Psych: A&Ox3; reactive affect; no obv thought d/o      ROS:   Review of Systems  as above   Objective:   Physical Exam      Wt Readings from Last 3 Encounters:   03/13/14 133 lb 6.4 oz (60.51 kg)   09/03/13 135 lb (61.236 kg)   05/31/13 132 lb (59.875 kg)         Filed Vitals:    03/13/14 1518   BP: 120/90      Pulse: 69   Temp: 98.1 ??F (36.7 ??C)   TempSrc: Oral   Weight: 133 lb 6.4 oz (60.51 kg)   SpO2: 97%     Body mass index is 21.21 kg/(m^2).  There is no height on file to calculate BSA.    NAD  Psych: A&Ox3; reactive affect; no obv thought d/o             Assessment/Plan:     Time in: 324  Time out: 335    10 mins, Greater than 50% of time spent in face to face counselling/coordination of care      A/P:  1. ADD- ran oarrs report in Feb: no problems; uds done in the past: no red flags;   continue w/ current treatment ; as developing some tolerance consider psych referral  2. Asthma: moderate persistent, not well controlled on symbicort, did better w advair 500; will appeal to insurance company;  add back singulair; refill proair.    RTC: 3 months    New meds:   none  Patient education given  none  Patient verbalized understanding & agreement of the proposed plan.

## 2014-06-07 NOTE — Unmapped (Signed)
Can she come this afternoon? Can use a same day slot

## 2014-06-07 NOTE — Unmapped (Signed)
Called stating will probably run out of medication before appt, and was wondering if dr Thedore Mins would possibly refill prescription    Appt is made for 06/18/14

## 2014-06-07 NOTE — Unmapped (Signed)
Cannot make it at all this afternoon.  R/s 15th appt for 06/11/14 (Tue) 3:45 PM, per Dr. Thedore Mins.

## 2014-06-07 NOTE — Unmapped (Signed)
Can dbook at 315  Could see on tues, can use a same day slot

## 2014-06-07 NOTE — Unmapped (Signed)
Returned call to pt -  She already has an appt and cannot make the 4:45.

## 2014-06-11 ENCOUNTER — Ambulatory Visit: Admit: 2014-06-11 | Payer: PRIVATE HEALTH INSURANCE

## 2014-06-11 DIAGNOSIS — J4540 Moderate persistent asthma, uncomplicated: Secondary | ICD-10-CM

## 2014-06-11 MED ORDER — dextroamphetamine-amphetamine (ADDERALL XR) 15 MG 24 hr capsule
15 | ORAL_CAPSULE | Freq: Every morning | ORAL | Status: AC
Start: 2014-06-11 — End: 2014-06-11

## 2014-06-11 MED ORDER — dextroamphetamine-amphetamine (ADDERALL XR) 15 MG 24 hr capsule
15 | ORAL_CAPSULE | Freq: Every morning | ORAL | Status: AC
Start: 2014-06-11 — End: 2014-09-09

## 2014-06-11 MED ORDER — dextroamphetamine-amphetamine (ADDERALL XR) 15 MG 24 hr capsule
15 | ORAL_CAPSULE | Freq: Every morning | ORAL | Status: AC
Start: 2014-06-11 — End: 2014-10-11

## 2014-06-11 NOTE — Unmapped (Signed)
Subjective  HPI:   Patient ID: Mary [name] is a 43 y.o. female.    Chief Complaint:  HPI         The patient's chart was reviewed prior to the visit as part of pre-visit planning  Chief Complaint   Patient presents with   ??? Medication Refill     adderall; needs physical; scheduling mammogram with gyn      Problems:fu ADD, asthma  Asthma : doing better, c/w singulair and symbicort bid   Albuterol: needs to use every day, nocturnal awakening rarely     Doing well c/w meds : feels concentration is best w adderall @ 45mg  once daily : has developed some tolerance w it, doesn't work as well all of the time  Denies any s/e   Needs refills    Past Medical History   Diagnosis Date   ??? Environmental allergies    ??? Asthma    ??? Chronic back pain    ??? Cervical dysplasia    ??? Anxiety    ??? Depression    ??? ADD (attention deficit disorder)    ??? Varicella        Current Outpatient Prescriptions   Medication Sig Dispense Refill   ??? albuterol (PROAIR HFA) 90 mcg/actuation inhaler Inhale 1-2 puffs into the lungs every 4 hours as needed for Wheezing. q4-6hrs PRN  3 Inhaler  3   ??? budesonide-formoterol (SYMBICORT) 160-4.5 mcg/actuation inhaler Inhale 2 puffs into the lungs 2 times a day.  3 Inhaler  3   ??? dextroamphetamine-amphetamine (ADDERALL XR) 15 MG 24 hr capsule Take 3 capsules (45 mg total) by mouth every morning. To be filled after ___ days  90 capsule  0   ??? fluticasone (FLONASE) 50 mcg/actuation nasal spray 1-2 sprays by Nasal route daily as needed.  16 g  11   ??? montelukast (SINGULAIR) 10 mg tablet Take 1 tablet (10 mg total) by mouth at bedtime.  90 tablet  3     No current facility-administered medications for this visit.       NAD  LCTAB  RRR, no murmur  Psych: A&Ox3; reactive affect; no obv thought d/o      ROS:   Review of Systems  as above   Objective:   Physical Exam      Wt Readings from Last 3 Encounters:   06/11/14 131 lb 14.4 oz (59.829 kg)   03/13/14 133 lb 6.4 oz (60.51 kg)   09/03/13 135 lb (61.236 kg)                  Filed Vitals:    06/11/14 1557   BP: 126/88   Pulse: 77   Temp: 98.4 ??F (36.9 ??C)   TempSrc: Oral   Height: 5' 6.5 (1.689 m)   Weight: 131 lb 14.4 oz (59.829 kg)   SpO2: 97%     Body mass index is 20.97 kg/(m^2).  Body surface area is 1.67 meters squared.    NAD  Psych: A&Ox3; reactive affect; no obv thought d/o        No results found for this basename: HGBA1C     No results found for this basename: GLUF, MICROALBUR, LDLCALC, CREATININE       No results found for this basename: GLUCOSE, BUN, CO2, CREATININE, K, NA, CL, CALCIUM, ALBUMIN, PROT, ALKPHOS, ALT, AST, BILITOT       No results found for this basename: LIPIDCOMM, CHOLTOT, TRIG, HDL, CHOLHDL, VLDCHOL, LDL  Assessment/Plan:     Time in: 420  Time out:  425    10 mins, Greater than 50% of time spent in face to face counselling/coordination of care      A/P:  1. ADD- oarrs report:  no problems; uds done in the past: no red flags;   continue w/ current treatment ;   2. Asthma: moderate persistent, stable; continue w/ current treatment      RTC: 3 months    New meds:   none  Patient education given  none  Patient verbalized understanding & agreement of the proposed plan.

## 2014-07-05 ENCOUNTER — Ambulatory Visit: Admit: 2014-07-05 | Discharge: 2014-07-05 | Payer: PRIVATE HEALTH INSURANCE | Attending: Adult Health

## 2014-07-05 DIAGNOSIS — L814 Other melanin hyperpigmentation: Secondary | ICD-10-CM

## 2014-07-05 NOTE — Unmapped (Signed)
Subjective  HPI:   Patient ID: Mary [name] is a 43 y.o. female.    Chief Complaint:  Chief Complaint   Patient presents with   ??? Skin Lesion     Pt. c/o lesions on right shoulder and chest.       1. Lesion on right shoulder present x few weeks.  Reports it seems to be getting larger and more red.  It itches but does not hurt. No prior treatment.  No mod factors.  2.  Lesion on left chest present x few months.  Says it did seem larger and more red but now starting to get better.  Does itch.  No prior treatment.  3. Several 'freckles' on skin surface present x years. Denies any changes in s/s/c.  All asymptomatic.  No prior treatment.  No mod factors.    Derm History: LOV 02/18/12 (per JW)  (- ) Hx NMSC or MM  (+ ) sunburns easily  (+ ) uses sunscreen  (+ ) history of tanning bed use  (-) family hx of MM    ROS:   Review of Systems   Constitutional: Negative for fever and chills.   Respiratory: Negative for cough and shortness of breath.    Neurological: Negative for light-headedness and headaches.   Hematological: Does not bruise/bleed easily.   Reports seasonal allergies      Objective:   Physical Exam  Derm Physical Exam: Refused FSE - limited today    Examination was performed of the following were unremarkable: scalp/hair, head/face, conjunctivae/eyelids, gums/teeth/lips, neck, LUE and nails/digits    Abnormalities noted include: breast/axilla/chest and RUE    1. Right shoulder with a pink, scaling, thin 5mm papule with surrounding erythema and telangiectasias  2. Left chest, just left of midline, with a 3mm pearly pink papule  3. Face, chest, arms with several tan and brown smooth macules and patches    Assessment/Plan:     1. Right shoulder - r/o ISK vs BCC vs BLK  - Recommended biopsy due to uncertain nature of lesion. Risks, benefits, and alternatives were discussed and the patient gave consent  1% lidocaine with epi injected  Biopsy performed today to dermis  Specimen sent to pathology.  Wound care  discussed - written instructions provided  Discussed risk of discoloration, infection, and scarring    2. Left chest - r/o BCC  - Recommended biopsy due to uncertain nature of lesion. Risks, benefits, and alternatives were discussed and the patient gave consent  1% lidocaine with epi injected  Biopsy performed today to dermis  Specimen sent to pathology.  Wound care discussed - written instructions provided  Discussed risk of discoloration, infection, and scarring    3. Ephelides/lentigines  - Education and reassurance  -No atypical features today - rtc for any changes/concerns   -Skin cancer education, ABCDE's reviewed       -Sun protection education, new sunscreen labeling guidelines reviewed  - annual FSE recommend (declined today)

## 2014-07-10 NOTE — Unmapped (Signed)
Quick Note:    Left chest - Suggestive of lichenoid keratosis  Right shoulder - Inflamed hyperkeratotic seborrheic keratosis    Benign. No further treatment required. Please notify patient. Thanks!    ______

## 2014-09-09 MED ORDER — dextroamphetamine-amphetamine (ADDERALL XR) 15 MG 24 hr capsule
15 | ORAL_CAPSULE | Freq: Every morning | ORAL | Status: AC
Start: 2014-09-09 — End: 2014-10-11

## 2014-09-09 NOTE — Unmapped (Signed)
Pt called in for a f/u to get her adderall refilled.  There are no f/u appts for the practice until 12/18.  She has 3 days left (she lost track of time and thought she had more b/c she gets post dated scrips.)  Wants to know if she can be fitted in or if she can get another refill.    Callback # [phone]

## 2014-09-09 NOTE — Unmapped (Signed)
Please see phone note below.  Pt only has 3 days left of adderall and the next available appt is 12/18.  Pt is requesting a refill.   LOV  06/11/14  LR  #90  x0  06/11/14    Lab Results   Component Value Date    TSH 1.84 10/28/2009

## 2014-09-09 NOTE — Unmapped (Signed)
As we cant see her before she runs out will print rx for 1 month and she can follow up in 4 weeks

## 2014-09-10 NOTE — Unmapped (Signed)
rx put in your yellow folder to sign

## 2014-09-10 NOTE — Unmapped (Signed)
LMOM advised per Dr. Thedore Mins and that rx is at front desk to be picked up.

## 2014-10-11 ENCOUNTER — Ambulatory Visit: Admit: 2014-10-11 | Payer: PRIVATE HEALTH INSURANCE

## 2014-10-11 DIAGNOSIS — F988 Other specified behavioral and emotional disorders with onset usually occurring in childhood and adolescence: Secondary | ICD-10-CM

## 2014-10-11 MED ORDER — dextroamphetamine-amphetamine (ADDERALL) 10 mg Tab
10 | ORAL_TABLET | Freq: Four times a day (QID) | ORAL | Status: AC
Start: 2014-10-11 — End: 2014-10-11

## 2014-10-11 MED ORDER — dextroamphetamine-amphetamine (ADDERALL) 10 mg Tab
10 | ORAL_TABLET | Freq: Four times a day (QID) | ORAL | Status: AC
Start: 2014-10-11 — End: 2015-02-17

## 2014-10-11 NOTE — Unmapped (Signed)
Subjective  HPI:   Patient ID: Mary [name] is a 44 y.o. female.    Chief Complaint:  HPI         The patient's chart was reviewed prior to the visit as part of pre-visit planning  Chief Complaint   Patient presents with   ??? Medication Refill     adderall      Problems:fu ADD,    Doing well c/w meds : feels concentration is best w adderall @ 45mg  once daily : has developed some tolerance w it, doesn't work as well all of the time, would like to get off it  Denies any s/e   Needs refills    Past Medical History   Diagnosis Date   ??? Environmental allergies    ??? Asthma    ??? Chronic back pain    ??? Cervical dysplasia    ??? Anxiety    ??? Depression    ??? ADD (attention deficit disorder)    ??? Varicella        Current Outpatient Prescriptions   Medication Sig Dispense Refill   ??? albuterol (PROAIR HFA) 90 mcg/actuation inhaler Inhale 1-2 puffs into the lungs every 4 hours as needed for Wheezing. q4-6hrs PRN 3 Inhaler 3   ??? budesonide-formoterol (SYMBICORT) 160-4.5 mcg/actuation inhaler Inhale 2 puffs into the lungs 2 times a day. 3 Inhaler 3   ??? dextroamphetamine-amphetamine (ADDERALL XR) 15 MG 24 hr capsule Take 3 capsules (45 mg total) by mouth every morning. To be filled after ___ days 90 capsule 0   ??? dextroamphetamine-amphetamine (ADDERALL XR) 15 MG 24 hr capsule Take 3 capsules (45 mg total) by mouth every morning. To be filled after ___ days 90 capsule 0   ??? fluticasone (FLONASE) 50 mcg/actuation nasal spray 1-2 sprays by Nasal route daily as needed. 16 g 11   ??? montelukast (SINGULAIR) 10 mg tablet Take 1 tablet (10 mg total) by mouth at bedtime. 90 tablet 3     No current facility-administered medications for this visit.          ROS:   Review of Systems  as above   Objective:   Physical Exam      Wt Readings from Last 3 Encounters:   10/11/14 138 lb (62.596 kg)   06/11/14 131 lb 14.4 oz (59.829 kg)   03/13/14 133 lb 6.4 oz (60.51 kg)         Filed Vitals:    10/11/14 1626   BP: 124/82   Pulse: 75   Temp: 98.8 ??F  (37.1 ??C)   TempSrc: Oral   Height: 5' 6.5 (1.689 m)   Weight: 138 lb (62.596 kg)   SpO2: 98%     Body mass index is 21.94 kg/(m^2).  Body surface area is 1.71 meters squared.    NAD  Psych: A&Ox3; reactive affect; no obv thought d/o        No results found for: HGBA1C  No components found for: GLUF,  MICROALBUR,  LDLCALC,  CREATININE    No results found for: GLUCOSE    No results found for: LIPIDCOMM          Assessment/Plan:     Time in: 435  Time out:  445    10 mins, Greater than 50% of time spent in face to face counselling/coordination of care      A/P:  1. ADD- oarrs report:  no problems; uds done in the past: no red flags;  Switch to IR adderall:  start w 40mg /d and begin monthly taper off    RTC: 3 months, AWV as well    New meds:   none  Patient education given  none  Patient verbalized understanding & agreement of the proposed plan.

## 2014-10-11 NOTE — Unmapped (Signed)
Adderall taper:  1. 1 tablet 4x/day for 3-4 weeks then  2. 3x/day for 3-4 weeks then  3. 2x/day for 3-4 weeks then  4. Once daily for 3-4 weeks    Later we can look at resuming the Wellbutrin to see if that will help with your concentration

## 2014-12-10 ENCOUNTER — Ambulatory Visit: Admit: 2014-12-10 | Payer: PRIVATE HEALTH INSURANCE

## 2014-12-10 ENCOUNTER — Inpatient Hospital Stay: Admit: 2014-12-10 | Payer: PRIVATE HEALTH INSURANCE

## 2014-12-10 DIAGNOSIS — J111 Influenza due to unidentified influenza virus with other respiratory manifestations: Secondary | ICD-10-CM

## 2014-12-10 DIAGNOSIS — R05 Cough: Secondary | ICD-10-CM

## 2014-12-10 LAB — POCT INFLUENZA A/B
Rapid Influenza A Ag: POSITIVE
Rapid Influenza A Ag: POSITIVE
Rapid Influenza B Ag: NEGATIVE
Rapid Influenza B Ag: NEGATIVE

## 2014-12-10 MED ORDER — oseltamivir (TAMIFLU) 75 MG capsule
75 | ORAL_CAPSULE | Freq: Two times a day (BID) | ORAL | Status: AC
Start: 2014-12-10 — End: 2015-02-17

## 2014-12-10 MED ORDER — predniSONE (DELTASONE) 20 MG tablet
20 | ORAL_TABLET | Freq: Two times a day (BID) | ORAL | Status: AC
Start: 2014-12-10 — End: 2015-02-17

## 2014-12-10 NOTE — Unmapped (Signed)
lmtcb    pls inform pt no pneumonia

## 2014-12-10 NOTE — Unmapped (Signed)
Subjective  HPI:   Patient ID: Mary [name] is a 44 y.o. female.    Chief Complaint:  HPI       Chief Complaint   Patient presents with   ??? Fever     chest congestion, cough, headache, chills, body aches x 4 days (Dr. Thedore Mins states pt has asthma)     fever since sunday  Took ibuprofen 800mg  last night  Husband sick diagnosed with flu today,saw Dr Thedore Mins  Has chest congestion cough shortness of breath and myalgia  Appetite decreased  His inhalers no help with cough  See ros  Past Medical History   Diagnosis Date   ??? Environmental allergies    ??? Asthma    ??? Chronic back pain    ??? Cervical dysplasia    ??? Anxiety    ??? Depression    ??? ADD (attention deficit disorder)    ??? Varicella      Past Surgical History   Procedure Laterality Date   ??? Cervical cone biopsy     ??? Arthroscopy knee menisectomy, meniscal repair       right     History     Social History   ??? Marital Status: Married     Spouse Name: N/A     Number of Children: N/A   ??? Years of Education: N/A     Occupational History   ??? Not on file.     Social History Main Topics   ??? Smoking status: Never Smoker    ??? Smokeless tobacco: Never Used      Comment: 02/24/2011; passive smoke exposure:no (06/18/2005)   ??? Alcohol Use: Yes      Comment: occasionally (06/18/2005)   ??? Drug Use: No      Comment: 01/07/2011   ??? Sexual Activity: Not on file     Other Topics Concern   ??? Not on file     Social History Narrative     Family History   Problem Relation Age of Onset   ??? Cancer Mother      ? uterine   ??? Hypertension Father    ??? Asthma Other    ??? Allergies Other    ??? Bone cancer Other    ??? Melanoma Neg Hx        Medications:  Current Outpatient Prescriptions   Medication Sig Dispense Refill   ??? albuterol (PROAIR HFA) 90 mcg/actuation inhaler Inhale 1-2 puffs into the lungs every 4 hours as needed for Wheezing. q4-6hrs PRN 3 Inhaler 3   ??? budesonide-formoterol (SYMBICORT) 160-4.5 mcg/actuation inhaler Inhale 2 puffs into the lungs 2 times a day. 3 Inhaler 3   ???  dextroamphetamine-amphetamine (ADDERALL) 10 mg Tab Take 1 tablet (10 mg total) by mouth 4 times a day. 120 tablet 0   ??? fluticasone (FLONASE) 50 mcg/actuation nasal spray 1-2 sprays by Nasal route daily as needed. 16 g 11   ??? montelukast (SINGULAIR) 10 mg tablet Take 1 tablet (10 mg total) by mouth at bedtime. 90 tablet 3     No current facility-administered medications for this visit.        ROS:   Review of Systems   Constitutional: Positive for fever and fatigue.   HENT: Positive for congestion and postnasal drip.    Respiratory: Positive for cough, chest tightness and shortness of breath.    Cardiovascular: Negative for chest pain and palpitations.   Musculoskeletal: Positive for myalgias. Negative for neck pain.  Objective:   Physical Exam   Vitals reviewed.  Constitutional: She appears well-developed. No distress.   Appears sick   HENT:   Head: Normocephalic and atraumatic.   Left Ear: External ear normal.   Nose: Nose normal.   Mouth/Throat: Oropharynx is clear and moist. No oropharyngeal exudate.   Eyes: EOM are normal.   Neck: Normal range of motion. Neck supple.   Cardiovascular: Normal rate, regular rhythm, normal heart sounds and intact distal pulses.    No murmur heard.  Pulmonary/Chest: Effort normal.   Decreased breath sounds posteriorly with few  Right bi basilar crackles  Minimal improvement with albuterol nebs   Lymphadenopathy:     She has no cervical adenopathy.   Skin: She is not diaphoretic.   Psychiatric: She has a normal mood and affect. Her behavior is normal.             Filed Vitals:    12/10/14 1455   BP: 124/74   Pulse: 100   Temp: 99.4 ??F (37.4 ??C)   TempSrc: Oral   Height: 5' 6.5 (1.689 m)   Weight: 142 lb (64.411 kg)   SpO2: 96%     Body mass index is 22.58 kg/(m^2).  Body surface area is 1.74 meters squared.         Lab Results   Component Value Date    WBC 5.5 THOUSAND/UL 10/28/2009    HGB 11.9 10/28/2009    HCT 35.9 10/28/2009    MCV 88.4 10/28/2009    PLT 200 THOUSAND/UL  10/28/2009     No results found for: HGBA1C  No results found for: LIPIDCOMM, CHOLTOT, TRIG, HDL, CHOLHDL, LDL  Lab Results   Component Value Date    TSH 1.84 10/28/2009     No results found for: VITD25H  No results found for: GLUCOSE, BUN, CO2, CREATININE, K, NA, CL, CALCIUM, ALBUMIN, PROT, ALKPHOS, ALT, AST, BILITOT           Assessment/Plan:     Fever  Cough   bronchitis    See pt instructions  The patient understands his/her medication(s).  Side effects/barriers of these medications were addressed.  Over-the-counter medications were also addressed during this encounter.

## 2014-12-10 NOTE — Unmapped (Signed)
Apt scheduled and pt notified

## 2014-12-10 NOTE — Unmapped (Signed)
Pt has flu like symptoms. Fever, chills, cough and difficulty breathing.  Asks if either of the providers will work her in today.  Her best callback is [phone]

## 2014-12-10 NOTE — Unmapped (Signed)
You have influenza A   Fluids   chest xray   albuterol inhaler every 4hrs  mucinex dm cough meds  Start tamiflu  If symptoms worsen or no bette rin 1 week please call our office    Schedule f/u appt with dr Thedore Mins on friday

## 2014-12-11 NOTE — Unmapped (Signed)
Pt phoned received below information with verbal understanding. And states that she is feeling okay.

## 2014-12-11 NOTE — Unmapped (Signed)
CALLED IN REFERENCE TO RESULTS OF XRAY? PER NOTE XRAY IS CLEAR

## 2014-12-16 NOTE — Unmapped (Signed)
CXR was normal, no pneumonia

## 2014-12-17 NOTE — Unmapped (Signed)
Left message with the results of chest xray

## 2015-02-17 ENCOUNTER — Ambulatory Visit: Admit: 2015-02-17 | Payer: PRIVATE HEALTH INSURANCE

## 2015-02-17 DIAGNOSIS — F988 Other specified behavioral and emotional disorders with onset usually occurring in childhood and adolescence: Secondary | ICD-10-CM

## 2015-02-17 MED ORDER — albuterol (PROAIR HFA) 90 mcg/actuation inhaler
90 | RESPIRATORY_TRACT | Status: AC | PRN
Start: 2015-02-17 — End: 2015-05-23

## 2015-02-17 MED ORDER — dextroamphetamine-amphetamine (ADDERALL) 10 mg Tab
10 | ORAL_TABLET | Freq: Three times a day (TID) | ORAL | Status: AC
Start: 2015-02-17 — End: 2015-05-23

## 2015-02-17 MED ORDER — dextroamphetamine-amphetamine (ADDERALL) 10 mg Tab
10 | ORAL_TABLET | Freq: Three times a day (TID) | ORAL | Status: AC
Start: 2015-02-17 — End: 2015-02-17

## 2015-02-17 MED ORDER — montelukast (SINGULAIR) 10 mg tablet
10 | ORAL_TABLET | Freq: Every evening | ORAL | Status: AC
Start: 2015-02-17 — End: 2015-05-23

## 2015-02-17 MED ORDER — budesonide-formoterol (SYMBICORT) 160-4.5 mcg/actuation inhaler
160-4.5 | Freq: Two times a day (BID) | RESPIRATORY_TRACT | Status: AC
Start: 2015-02-17 — End: 2015-08-06

## 2015-02-17 NOTE — Unmapped (Signed)
Subjective  HPI:   Patient ID: Mary [name] is a 44 y.o. female.    Chief Complaint:  HPI         The patient's chart was reviewed prior to the visit as part of pre-visit planning  Chief Complaint   Patient presents with   ??? Medication Refill     adderall       Problems:fu ADD, asthma    Doing well c/w meds :c/ w adderall last visit reported had developed some tolerance w it and wanting to get off it, changed to IR 10mg  qid w plan to monthly taper down i think this is doing fairly well: now down to 30mg /d: does note some concentration problem and fatigue  Denies any s/e x fatigue  Needs refills  C/w asthma meds x uses symbicort only 1 puff bid  Did have the flu a few months ago and its still not back to normal: cough/wheeze+    Past Medical History   Diagnosis Date   ??? Environmental allergies    ??? Asthma    ??? Chronic back pain    ??? Cervical dysplasia    ??? Anxiety    ??? Depression    ??? ADD (attention deficit disorder)    ??? Varicella        Current Outpatient Prescriptions   Medication Sig Dispense Refill   ??? albuterol (PROAIR HFA) 90 mcg/actuation inhaler Inhale 1-2 puffs into the lungs every 4 hours as needed for Wheezing. q4-6hrs PRN 3 Inhaler 3   ??? budesonide-formoterol (SYMBICORT) 160-4.5 mcg/actuation inhaler Inhale 2 puffs into the lungs 2 times a day. 3 Inhaler 3   ??? dextroamphetamine-amphetamine (ADDERALL) 10 mg Tab Take 1 tablet (10 mg total) by mouth 4 times a day. 120 tablet 0   ??? fluticasone (FLONASE) 50 mcg/actuation nasal spray 1-2 sprays by Nasal route daily as needed. 16 g 11   ??? montelukast (SINGULAIR) 10 mg tablet Take 1 tablet (10 mg total) by mouth at bedtime. 90 tablet 3     No current facility-administered medications for this visit.          ROS:   Review of Systems  as above   Objective:   Physical Exam      Wt Readings from Last 3 Encounters:   02/17/15 142 lb (64.411 kg)   12/10/14 142 lb (64.411 kg)   10/11/14 138 lb (62.596 kg)         Filed Vitals:    02/17/15 1311   BP: 128/82    Pulse: 81   Temp: 98.7 ??F (37.1 ??C)   TempSrc: Oral   Resp: 16   Height: 5' 6.5 (1.689 m)   Weight: 142 lb (64.411 kg)   SpO2: 98%     Body mass index is 22.58 kg/(m^2).  Body surface area is 1.74 meters squared.    NAD  LCTAB  Psych: A&Ox3; reactive affect; no obv thought d/o        No results found for: HGBA1C  No components found for: GLUF,  MICROALBUR,  LDLCALC,  CREATININE    No results found for: GLUCOSE    No results found for: LIPIDCOMM          Assessment/Plan:     Time in: 110  Time out:  125    15 mins, Greater than 50% of time spent in face to face counselling/coordination of care      A/P:  1. ADD- oarrs report:  no problems; uds done  in the past: no red flags;  IR adderall: cont w 30mg /d, will decide later about taper down to 20mg   2. Asthma: inc symbicort to 2 puffs bid; refill meds; refer to pulm, d/w pt re annual flu shot    RTC: 3 months    New meds:   none  Patient education given  none  Patient verbalized understanding & agreement of the proposed plan.

## 2015-03-17 NOTE — Unmapped (Signed)
lvm for pt to call and schedule appt referral in epic

## 2015-03-18 NOTE — Unmapped (Signed)
lvm for pt call and schedule appt

## 2015-03-28 NOTE — Unmapped (Signed)
lvm for pt to call and schedule appt, referral in Epic

## 2015-04-01 NOTE — Unmapped (Signed)
lvm x3 for pt to call and schedule letter sent

## 2015-05-23 ENCOUNTER — Ambulatory Visit: Admit: 2015-05-23 | Payer: PRIVATE HEALTH INSURANCE

## 2015-05-23 DIAGNOSIS — J4540 Moderate persistent asthma, uncomplicated: Secondary | ICD-10-CM

## 2015-05-23 MED ORDER — dextroamphetamine-amphetamine (ADDERALL) 10 mg Tab
10 | ORAL_TABLET | Freq: Three times a day (TID) | ORAL | Status: AC
Start: 2015-05-23 — End: 2015-09-03

## 2015-05-23 MED ORDER — albuterol (PROAIR HFA) 90 mcg/actuation inhaler
90 | RESPIRATORY_TRACT | Status: AC | PRN
Start: 2015-05-23 — End: 2015-08-06

## 2015-05-23 MED ORDER — dextroamphetamine-amphetamine (ADDERALL) 10 mg Tab
10 | ORAL_TABLET | Freq: Three times a day (TID) | ORAL | Status: AC
Start: 2015-05-23 — End: 2015-06-25

## 2015-05-23 MED ORDER — dextroamphetamine-amphetamine (ADDERALL) 10 mg Tab
10 | ORAL_TABLET | Freq: Three times a day (TID) | ORAL | Status: AC
Start: 2015-05-23 — End: 2015-05-23

## 2015-05-23 NOTE — Unmapped (Signed)
Subjective  HPI:   Patient ID: Mary [name] is a 44 y.o. female.    Chief Complaint:  HPI         The patient's chart was reviewed prior to the visit as part of pre-visit planning  Chief Complaint   Patient presents with   ??? Medication Refill      Problems:fu ADD, asthma    Doing well c/w meds :c/ w adderall, usu 20mg /d, occly 30mg    wanting to get off it but w last taper down feel more flighty  Denies any s/e x fatigue  Needs refills  C/w asthma meds : uses symbicort now 2 puffs bid, has appt NPV w pulm   Albuterol variable, good and bad days    Past Medical History   Diagnosis Date   ??? Environmental allergies    ??? Asthma    ??? Chronic back pain    ??? Cervical dysplasia    ??? Anxiety    ??? Depression    ??? ADD (attention deficit disorder)    ??? Varicella        Current Outpatient Prescriptions   Medication Sig Dispense Refill   ??? albuterol (PROAIR HFA) 90 mcg/actuation inhaler Inhale 1-2 puffs into the lungs every 4 hours as needed for Wheezing. q4-6hrs PRN 3 Inhaler 3   ??? budesonide-formoterol (SYMBICORT) 160-4.5 mcg/actuation inhaler Inhale 2 puffs into the lungs 2 times a day. 3 Inhaler 3   ??? dextroamphetamine-amphetamine (ADDERALL) 10 mg Tab Take 1 tablet (10 mg total) by mouth 3 times a day. 90 tablet 0   ??? dextroamphetamine-amphetamine (ADDERALL) 10 mg Tab Take 1 tablet (10 mg total) by mouth 3 times a day. 90 tablet 0   ??? fluticasone (FLONASE) 50 mcg/actuation nasal spray 1-2 sprays by Nasal route daily as needed. 16 g 11     No current facility-administered medications for this visit.          ROS:   Review of Systems  as above   Objective:   Physical Exam      Wt Readings from Last 3 Encounters:   05/23/15 140 lb (63.504 kg)   02/17/15 142 lb (64.411 kg)   12/10/14 142 lb (64.411 kg)         Filed Vitals:    05/23/15 1639   BP: 122/74   Pulse: 73   Temp: 98.7 ??F (37.1 ??C)   TempSrc: Oral   Weight: 140 lb (63.504 kg)   SpO2: 98%     Body mass index is 22.26 kg/(m^2).  There is no height on file to  calculate BSA.    NAD  LCTAB  Psych: A&Ox3; reactive affect; no obv thought d/o        No results found for: HGBA1C  No components found for: GLUF,  MICROALBUR,  LDLCALC,  CREATININE    No results found for: GLUCOSE    No results found for: LIPIDCOMM          Assessment/Plan:     Time in: 445  Time out:  455    15 mins, Greater than 50% of time spent in face to face counselling/coordination of care      A/P:  1. ADD- oarrs report:  no problems; uds done in the past: no red flags;  IR adderall: cont w 30mg /d, will decide later about taper down to 20mg   2. Asthma: continue w/ current treatment  ; to see dr Selena Batten next month NPV    RTC: 3 months  New meds:   none  Patient education given  none  Patient verbalized understanding & agreement of the proposed plan.

## 2015-06-25 ENCOUNTER — Ambulatory Visit: Admit: 2015-06-25 | Discharge: 2015-06-25 | Payer: PRIVATE HEALTH INSURANCE | Attending: Acute Care

## 2015-06-25 ENCOUNTER — Ambulatory Visit: Admit: 2015-06-25 | Payer: PRIVATE HEALTH INSURANCE

## 2015-06-25 DIAGNOSIS — J4520 Mild intermittent asthma, uncomplicated: Secondary | ICD-10-CM

## 2015-06-25 DIAGNOSIS — J4530 Mild persistent asthma, uncomplicated: Secondary | ICD-10-CM

## 2015-06-25 LAB — IGE: IgE: 272.7 IU/mL (ref 1.3–165.3)

## 2015-06-25 MED ORDER — albuterol (PROVENTIL) 2.5 mg /3 mL (0.083 %) nebulizer solution
2.5 | Freq: Four times a day (QID) | RESPIRATORY_TRACT | Status: AC | PRN
Start: 2015-06-25 — End: 2016-05-24

## 2015-06-25 MED ORDER — omeprazole (PRILOSEC) 40 MG capsule
40 | ORAL_CAPSULE | Freq: Every morning | ORAL | Status: AC
Start: 2015-06-25 — End: 2015-08-06

## 2015-06-25 NOTE — Unmapped (Signed)
Initial Outpatient Pulmonary Medicine Consultation     REASON FOR PULMONARY CONSULT:  Chief Complaint   Patient presents with   ??? New Patient Visit/ Consultation     uncontrolled asthma. had the flu- march 2016 had a cxr.    ??? Chest Pain     tightness- has been going on for months, in the last couple weeks has been so tight  taking a deep breath causes pain and quality of life has decreased.    ??? Fatigue   ??? Shortness of Breath   ??? Wheezing   ??? Sinus Problem   ??? Fever     3 weeks ago had a fever no other symptoms.        HPI  Mary [name] is a 44 y.o. female who presents for New Patient Visit/ Consultation; Chest Pain; Fatigue; Shortness of Breath; Wheezing; Sinus Problem; and Fever  Hx of asthma on symbicort recently increased to 2 puffs bid by PCP Dr. Thedore Mins. Has had asthma all of her life, diagnosed as an infant. Has been hospitalized for asthma and pneumonia at age 29 and 58. Moved to [place] about 56yrs ago and asthma symptoms have been worse over last 26yrs. Used to be on advair and symptoms were much better controlled. Uses albuterol everyday good day 2 times a day, bad day 5 times a day or more.     PAST MEDICAL HISTORY  Past Medical History   Diagnosis Date   ??? Environmental allergies    ??? Asthma    ??? Chronic back pain    ??? Cervical dysplasia    ??? Anxiety    ??? Depression    ??? ADD (attention deficit disorder)    ??? Varicella        SURGICAL HISTORY  Past Surgical History   Procedure Laterality Date   ??? Cervical cone biopsy     ??? Arthroscopy knee menisectomy, meniscal repair       right   ??? Augmentation breast endoscopic         CURRENT HOME MEDICATIONS  Current Outpatient Prescriptions   Medication Sig   ??? albuterol Inhale 1-2 puffs into the lungs every 4 hours as needed for Wheezing. q4-6hrs PRN   ??? budesonide-formoterol Inhale 2 puffs into the lungs 2 times a day.   ??? dextroamphetamine-amphetamine Take 1 tablet (10 mg total) by mouth 3 times a day.   ??? montelukast Take 10 mg by mouth every morning.   ???  fluticasone 1-2 sprays by Nasal route daily as needed.     No current facility-administered medications for this visit.       ALLERGIES  No Known Allergies    SOCIAL HISTORY  Social History     Social History   ??? Marital Status: Married     Spouse Name: N/A   ??? Number of Children: N/A   ??? Years of Education: N/A     Social History Main Topics   ??? Smoking status: Never Smoker    ??? Smokeless tobacco: Never Used      Comment: 02/24/2011; passive smoke exposure:no (06/18/2005)   ??? Alcohol Use: 1.2 oz/week     2 Glasses of wine per week   ??? Drug Use: No      Comment: 01/07/2011   ??? Sexual Activity: Not Asked     Other Topics Concern   ??? Occupational Exposure No   ??? Exercise Yes     maybe 2 days a week   ??? Seat  Belt Yes     Social History Narrative    Currently not working. Lives in a newer home with husband and 2 kids, no known mold in the home, no pets at home, no occupational exposures.        FAMILY HISTORY  Family History   Problem Relation Age of Onset   ??? Cancer Mother      ? uterine   ??? Hypertension Father    ??? Asthma Father    ??? Asthma Other    ??? Allergies Other    ??? Bone cancer Other    ??? Melanoma Neg Hx        ROS:  CONSTITUTIONAL: No fever/chills or night sweats, no recent weight change.  No sick contacts.  HENT:  No epistaxsis, occ nasal discharge takes flonase prn, no sore throat.  CARDIAC: Some chest discomfort on L anterior chest off and on with some deep breath at times occ palpitations, no lower extremity edema, no orthopnea, or PND.  RESPIRATORY: No chronic cough, +wheeze, + SOB with activity, cannot play tennis anymore  GI:  No dysphagia, occ heartburn, no melena, no hematochezia.  MUSCULOSKELETAL: No joint pain.  No Raynaud's Phenomenon.  SKIN: No rashes or lesions.  NEURO: No morning headaches, syncope, or TIA symptoms.  PSYCHIATRIC: No depression or anxiety.  Sleep:  Occ witnesses snoring no apnea in sleep.  + excessive daytime somnolence. Occ napping. Bed 10-12a wakes 7:30am, sleeps through the  night 3-4 nights a week sometimes difficulty falling back to sleep  No creeping/crawling sensation in legs at night.    PHYSICAL EXAM:  VITAL SIGNS:   Filed Vitals:    06/25/15 1551   BP: 124/76   Pulse: 79   Height: 5' 6 (1.676 m)   Weight: 141 lb 6.4 oz (64.139 kg)   SpO2: 100%     Wt Readings from Last 3 Encounters:   06/25/15 141 lb 6.4 oz (64.139 kg)   05/23/15 140 lb (63.504 kg)   02/17/15 142 lb (64.411 kg)     GEN: No respiratory distress, alert and oriented x3  HEENT: Atraumatic, normocephalic, pupils equal reactive to light, extraocular movements intact, sclera anicteric, no maxillary or frontal sinus tenderness to palpation,  mucous membranes moist, no oral lesions. Mallampati grade 3.  NECK:  supple, no masses, no lymphadenopathy  CARDIOVASCULAR: Regular Rate and Rhythm, no murmurs gallops or rubs. Radial pulse 2+ and equal.   LUNGS: Clear to ausculation bilaterally, no wheeze, no rhonchi, no rales, no egophony, no dullness to percussion  ABDOMEN: Soft, non-tender, nondistended, bowel sounds present  SKIN: Warm, dry  EXTREMITIES: No clubbing, no pitting edema.  NEURO: CN 2-12 grossly intact. No gross focal deficits.    PSYCH: Normal affect and mood.    Data:    12/10/14 CXR  No acute cardiopulmonary abnormality              IMPRESSION:   1. Hx Mild intermittent asthma  2. ADD on adderall    PLAN:   1.  Full PFT with methacholine challenge  2. Cont symbicort  3. Albuterol PRN  4. Add singulair  5. Add Prilosec  6. IgE  7. Follow up in 4 months, call sooner if any new or worsening symptoms

## 2015-07-28 ENCOUNTER — Inpatient Hospital Stay: Admit: 2015-07-28 | Payer: PRIVATE HEALTH INSURANCE | Attending: Acute Care

## 2015-07-28 DIAGNOSIS — J4530 Mild persistent asthma, uncomplicated: Secondary | ICD-10-CM

## 2015-07-28 MED ORDER — albuterol (PROVENTIL) nebulizer solution 2.5 mg
2.5 | Freq: Once | RESPIRATORY_TRACT | Status: AC
Start: 2015-07-28 — End: 2015-07-28
  Administered 2015-07-28: 20:00:00 2.5 mg via RESPIRATORY_TRACT

## 2015-07-28 MED FILL — ALBUTEROL SULFATE 2.5 MG/3 ML (0.083 %) SOLUTION FOR NEBULIZATION: 2.5 2.5 mg /3 mL (0.083 %) | RESPIRATORY_TRACT | Qty: 3

## 2015-07-28 NOTE — Unmapped (Signed)
07/28/15 1700   6 Minute Walk   Ordering Physician: Rock Nephew NP   Diagnosis: Asthma   Height 5' 6 (1.676 m)   Weight 141 POUNDS   FiO2 During Test: Room Air   Pre-Walk   Pre - Borg (Dyspnea): 1   Pre - Borg (Fatigue): 0.5   Baseline:   SpO2: 100   HR: 80   FiO2 (lpm) 0   Minute 1:   SpO2: 99   HR: 93   FiO2 (lpm) 0   Laps: 0   Minute 2:   SpO2: 98   HR: 107   FiO2 (lpm) 0   Laps: 0   Minute 3:   SpO2: 96   HR: 110   FiO2 (lpm) 0   Laps: 0   Minute 4:   SpO2: 96   HR: 121   FiO2 (lpm) 0   Laps: 0   Minute 5:   SpO2: 96   HR: 127   FiO2 (lpm) 0   Laps: 0   Minute 6:   SpO2: 98   HR: 127   FiO2 (lpm) 0   Laps: 10   Recovery 1 Minute:   SpO2: 99   HR: 89   Recovery 2 Minutes:   SpO2: 100   HR: 86   Lowest Sat and Total Laps:   Lowest Sat: 96   Total Laps: 10   Post Walk:   Post - Borg (Dyspnea): 1-2   Post - Borg (Fatigue): 0   Stopped or Paused before 6 Minutes: No   Symptoms: none   Total Distance Walked   Feet (80 / 100/ 200) 200   Additional Feet: 15   Total Distance Walked (feet): 2015   Total Distance Walked (meters): 614.17   MPH 3.82   METS 3.93

## 2015-07-28 NOTE — Unmapped (Signed)
[place] [facility]    Pulmonary Function Tests Interpretation    Mary [name] is a 44 y.o. female who had a pulmonary function test performed at the [facility].    Obstructive defect is present.  Spirometry shows moderately severe obstructive defect.  There is a significant response to bronchodilator demonstrated.  Air trapping is present.  Diffusing capacity is elevated and may suggest asthma.      6 Minute Walk    A 6 minute walk was performed in accordance with the ATS Guidelines [name] Crit Care Med 2002; 166: 111-117) at the Pulmonary Function Laboratory.    Total Distance Walked:  2050 feet  Baseline SpO2:  97   Lowest SpO2:  96  Oximetry with 6 minute walk test shows no desaturation with submaximal exercise.  Read performed by:  Metro Kung, MD 07/29/2015 8:09 PM    PFT/ABG/6MW/10MW 07/28/2015   FEV1 (liters) 1.73   FEV1% 56   FVC (liters) 3.56   FVC% 92   FEV1/FVC (%) 48.69   TLC (liters) 6.15   TLC% 107   RV 2.80   RV% 149   VC 3.35   VC% 87   DLCO (ml/mmHg sec) 26.20   DLCO % 124   -BORG Scale - Dyspnea (Pre) 1   - BORG Scale - Dyspnea (Post) 1-2   - Pre SAT 100   - Low SAT 96   - Distance Walked (feet) 2015   - Distance Walked (meters) 614.17   - FiO2 During Test Room [id number]

## 2015-08-06 ENCOUNTER — Ambulatory Visit: Admit: 2015-08-06 | Discharge: 2015-08-06 | Payer: PRIVATE HEALTH INSURANCE | Attending: Acute Care

## 2015-08-06 DIAGNOSIS — J4540 Moderate persistent asthma, uncomplicated: Secondary | ICD-10-CM

## 2015-08-06 MED ORDER — albuterol (PROAIR HFA) 90 mcg/actuation inhaler
90 | RESPIRATORY_TRACT | Status: AC | PRN
Start: 2015-08-06 — End: 2016-06-18

## 2015-08-06 MED ORDER — cetirizine (ZYRTEC) 1 mg/mL syrup
1 | Freq: Every day | ORAL | 2.00 refills | 30.00000 days | Status: AC
Start: 2015-08-06 — End: 2017-03-08

## 2015-08-06 MED ORDER — fluticasone (FLONASE) 50 mcg/actuation nasal spray
50 | Freq: Every day | NASAL | Status: AC | PRN
Start: 2015-08-06 — End: 2015-12-24

## 2015-08-06 MED ORDER — fluticasone-salmeterol (ADVAIR DISKUS) 500-50 mcg/dose DsDv
500-50 | Freq: Two times a day (BID) | RESPIRATORY_TRACT | 2.00 refills | 30.00000 days | Status: AC
Start: 2015-08-06 — End: 2016-08-23

## 2015-08-06 MED ORDER — omeprazole (PRILOSEC) 40 MG capsule
40 | ORAL_CAPSULE | Freq: Every morning | ORAL | Status: AC
Start: 2015-08-06 — End: 2017-02-10

## 2015-08-06 MED ORDER — montelukast (SINGULAIR) 10 mg tablet
10 | ORAL_TABLET | Freq: Every morning | ORAL | Status: AC
Start: 2015-08-06 — End: 2016-05-24

## 2015-08-06 NOTE — Unmapped (Signed)
Outpatient Pulmonary Medicine Follow Up visit       HPI  Mary [name] is a 44 y.o. female who presents for No chief complaint on file.  Mary [name] does not seem to help as much as advair, no difference from symbicort. Using albuterol 4 times a day, when she was on high dose advair did not need to use rescue in haler.       Last Visit:  Hx of asthma on symbicort recently increased to 2 puffs bid by PCP Dr. Thedore [name]. Has had asthma all of her life, diagnosed as an infant. Has been hospitalized for asthma and pneumonia at age 42 and 21. Moved to [place] about 102yrs ago and asthma symptoms have been worse over last 50yrs. Used to be on advair and symptoms were much better controlled. Uses albuterol everyday good day 2 times a day, bad day 5 times a day or more.       ROS:  CONSTITUTIONAL: No fever/chills or night sweats, no recent weight change.  No sick contacts.  HENT:  No epistaxsis, occ nasal discharge takes flonase prn, no sore throat.  CARDIAC: Some chest discomfort on L anterior chest off and on with some deep breath at times occ palpitations, no lower extremity edema, no orthopnea, or PND.  RESPIRATORY: No chronic cough, +wheeze, + SOB with activity, cannot play tennis anymore  GI:  No dysphagia, occ heartburn, no melena, no hematochezia.  MUSCULOSKELETAL: No joint pain.  No Raynaud's Phenomenon.  SKIN: No rashes or lesions.  NEURO: No morning headaches, syncope, or TIA symptoms.  PSYCHIATRIC: No depression or anxiety.  Sleep:  Occ witnesses snoring no apnea in sleep.  + excessive daytime somnolence. Occ napping. Bed 10-12a wakes 7:30am, sleeps through the night 3-4 nights a week sometimes difficulty falling back to sleep  No creeping/crawling sensation in legs at night.    PHYSICAL EXAM:  VITAL SIGNS:   There were no vitals filed for this visit.  Wt Readings from Last 3 Encounters:   06/25/15 141 lb 6.4 oz (64.139 kg)   05/23/15 140 lb (63.504 kg)   02/17/15 142 lb (64.411 kg)     GEN: No respiratory distress,  alert and oriented x3  HEENT: Atraumatic, normocephalic, pupils equal reactive to light, extraocular movements intact, sclera anicteric, no maxillary or frontal sinus tenderness to palpation,  mucous membranes moist, no oral lesions. Mallampati grade 3.  NECK:  supple, no masses, no lymphadenopathy  CARDIOVASCULAR: Regular Rate and Rhythm, no murmurs gallops or rubs. Radial pulse 2+ and equal.   LUNGS: Clear to ausculation bilaterally, no wheeze, no rhonchi, no rales, no egophony, no dullness to percussion  ABDOMEN: Soft, non-tender, nondistended, bowel sounds present  SKIN: Warm, dry  EXTREMITIES: No clubbing, no pitting edema.  NEURO: CN 2-12 grossly intact. No gross focal deficits.    PSYCH: Normal affect and mood.    Data:    12/10/14 CXR  No acute cardiopulmonary abnormality    07/28/15 PFT     Ref Range 07/29/15 ??8:10 PM   ??   FEV1 liters 1.73 ??   FEV1%  56 ??   FVC liters 3.56 ??   FVC%  92 ??   FEV1/FVC % 48.69 ??   FEV1/FVC EXP  81.03 ??   RESPONSE TO BRONCHODILATOR  +10% FVC, +29% FEV1 ??   TLC liters 6.15 ??   TLC%  107 ??   RV  2.80 ??   RV%  149 ??   VC  3.35 ??  VC%  87 ??   DLCO ml/mmHg sec 26.20 ??   DLCO%  124 ??   ?? Narrative  ??   ?? Mary Kung, MD ?? ?? 07/29/2015 ??8:10 PM  [place] [facility]    Pulmonary Function Tests Interpretation    Mary [name] is a 44 y.o. female who had a pulmonary function   test performed at the [facility] Function   Laboratory.    Obstructive defect is present.  Spirometry shows moderately severe obstructive defect.  There is a significant response to bronchodilator demonstrated.  Air trapping is present.  Diffusing capacity is elevated and may suggest asthma.      6 Minute Walk    A 6 minute walk was performed in accordance with the ATS   Guidelines Mary [name] 2002; 166: 111-117) at the   Pulmonary Function Laboratory.    Total Distance Walked: ??2050 feet  Baseline SpO2: ??97   Lowest SpO2: ??96  Oximetry with 6 minute  walk test shows no desaturation with   submaximal exercise.  Read performed by:  Mary Kung, MD 07/29/2015 8:09 PM               IgE: 272            IMPRESSION:   1. Mod intermittent asthma without exacerbation  2. ADD on adderall    PLAN:   1.  Full PFT and 6 min walk reviewed  2. Cont symbicort  3. Albuterol PRN  4. Cont singulair  5. Cont  Prilosec  6. IgE 272, cont to monitor for now  7. Referral to allergist for skin testing  8. Follow up in 4 months, call sooner if any new or worsening symptoms

## 2015-08-25 NOTE — Unmapped (Signed)
Pt calling to ask for a short-term controlled Rx. Her appt is scheduled for 09/03/15.    Adderall 10 mg  LR- 05/23/15 #90x0  LOV- 05/23/15  Lab Results   Component Value Date    TSH 1.84 10/28/2009

## 2015-09-03 ENCOUNTER — Ambulatory Visit: Admit: 2015-09-03 | Payer: PRIVATE HEALTH INSURANCE

## 2015-09-03 DIAGNOSIS — F909 Attention-deficit hyperactivity disorder, unspecified type: Secondary | ICD-10-CM

## 2015-09-03 MED ORDER — escitalopram oxalate (LEXAPRO) 10 MG tablet
10 | ORAL_TABLET | Freq: Every day | ORAL | 1.00 refills | 30.00000 days | Status: AC
Start: 2015-09-03 — End: 2016-02-27

## 2015-09-03 MED ORDER — dextroamphetamine-amphetamine (ADDERALL) 10 mg Tab
10 | ORAL_TABLET | Freq: Three times a day (TID) | ORAL | Status: AC
Start: 2015-09-03 — End: 2015-09-03

## 2015-09-03 MED ORDER — dextroamphetamine-amphetamine (ADDERALL) 10 mg Tab
10 | ORAL_TABLET | Freq: Three times a day (TID) | ORAL | Status: AC
Start: 2015-09-03 — End: 2015-11-13

## 2015-09-03 NOTE — Unmapped (Signed)
Subjective  HPI:   Patient ID: Mary [name] is a 44 y.o. female.    Chief Complaint:  HPI         The patient's chart was reviewed prior to the visit as part of pre-visit planning  Chief Complaint   Patient presents with   ??? Medication Refill     adderall      Problems:fu ADD    Doing well c/w meds : takes adderall  10mg  1-3x/d, on average 20mg  ran out few days ago  In the past reported had developed some tolerance w it and wanting to get off it,  Long term plan taper down   Denies any s/e x fatigue  Needs refills  C/o feeling more anxiety and also some depression recently   Stressors: just trying to gradly dec dosing v adderall   Past tx: many yrs ago, lexapro    Past Medical History   Diagnosis Date   ??? Environmental allergies    ??? Asthma    ??? Chronic back pain    ??? Cervical dysplasia    ??? Anxiety    ??? Depression    ??? ADD (attention deficit disorder)    ??? Varicella        Current Outpatient Prescriptions   Medication Sig Dispense Refill   ??? albuterol (PROAIR HFA) 90 mcg/actuation inhaler Inhale 1-2 puffs into the lungs every 4 hours as needed for Wheezing. q4-6hrs PRN 2 Inhaler 12   ??? albuterol (PROVENTIL) 2.5 mg /3 mL (0.083 %) nebulizer solution Inhale 3 mLs (2.5 mg total) by nebulization every 6 hours as needed for Wheezing. 90 vial 6   ??? cetirizine (ZYRTEC) 1 mg/mL syrup Take 10 mLs (10 mg total) by mouth daily. 118 mL 12   ??? dextroamphetamine-amphetamine (ADDERALL) 10 mg Tab Take 1 tablet (10 mg total) by mouth 3 times a day. 90 tablet 0   ??? fluticasone (FLONASE) 50 mcg/actuation nasal spray Use 1-2 sprays into each nostril daily as needed. 16 g 12   ??? fluticasone-salmeterol (ADVAIR DISKUS) 500-50 mcg/dose DsDv Inhale 1 puff into the lungs 2 times a day. 1 Inhaler 12   ??? montelukast (SINGULAIR) 10 mg tablet Take 1 tablet (10 mg total) by mouth every morning. 30 tablet 12   ??? omeprazole (PRILOSEC) 40 MG capsule Take 1 capsule (40 mg total) by mouth every morning before breakfast. 30 capsule 12     No  current facility-administered medications for this visit.          ROS:   Review of Systems  as above   Objective:   Physical Exam      Wt Readings from Last 3 Encounters:   09/03/15 142 lb (64.411 kg)   08/06/15 141 lb (63.957 kg)   06/25/15 141 lb 6.4 oz (64.139 kg)         Filed Vitals:    09/03/15 1442   BP: 120/80   Pulse: 60   Temp: 98.2 ??F (36.8 ??C)   TempSrc: Oral   Resp: 12   Weight: 142 lb (64.411 kg)     Body mass index is 22.93 kg/(m^2).  There is no height on file to calculate BSA.    NAD  Psych: A&Ox3; reactive affect; no obv thought d/o        No results found for: HGBA1C  No components found for: GLUF,  MICROALBUR,  LDLCALC,  CREATININE    No results found for: GLUCOSE    No results found for:  LIPIDCOMM          Assessment/Plan:     Time in: 245  Time out:  300    15 mins, Greater than 50% of time spent in face to face counselling/coordination of care      A/P:  1. ADD- oarrs report:  no problems; uds done in the past: no red flags;  IR adderall: cont w 30mg /d, Continue to monitor for worsening anxiety/depn, resume lexapro +/- vistaril    RTC: 3 months    New meds:   none  Patient education given  none  Patient verbalized understanding & agreement of the proposed plan.

## 2015-09-03 NOTE — Unmapped (Signed)
Watch for worsening of depression or anxiety: complete the PHQ9 if you feel the depression is worsening   We can add vistaril: helps with anxiety but is sedating

## 2015-09-12 ENCOUNTER — Emergency Department: Admit: 2015-09-12 | Payer: PRIVATE HEALTH INSURANCE

## 2015-09-12 ENCOUNTER — Inpatient Hospital Stay
Admit: 2015-09-12 | Discharge: 2015-09-13 | Disposition: A | Discharge status: Home / Self Care | Payer: PRIVATE HEALTH INSURANCE

## 2015-09-12 DIAGNOSIS — K5732 Diverticulitis of large intestine without perforation or abscess without bleeding: Secondary | ICD-10-CM

## 2015-09-12 LAB — HEPATIC FUNCTION PANEL
ALT: 9 U/L (ref 7–52)
AST: 12 U/L — ABNORMAL LOW (ref 13–39)
Albumin: 3.9 g/dL (ref 3.5–5.7)
Alkaline Phosphatase: 55 U/L (ref 36–125)
Bilirubin, Direct: 0.2 mg/dL (ref 0.0–0.4)
Bilirubin, Indirect: 0.9 mg/dL (ref 0.0–1.1)
Total Bilirubin: 1.1 mg/dL (ref 0.0–1.5)
Total Protein: 7.5 g/dL (ref 6.4–8.9)

## 2015-09-12 LAB — DIFFERENTIAL
Basophils Absolute: 25 /uL (ref 0–200)
Basophils Relative: 0.2 % (ref 0.0–1.0)
Eosinophils Absolute: 239 /uL (ref 15–500)
Eosinophils Relative: 1.9 % (ref 0.0–8.0)
Lymphocytes Absolute: 718 /uL (ref 850–3900)
Lymphocytes Relative: 5.7 % (ref 15.0–45.0)
Monocytes Absolute: 731 /uL (ref 200–950)
Monocytes Relative: 5.8 % (ref 0.0–12.0)
Neutrophils Absolute: 10886 /uL (ref 1500–7800)
Neutrophils Relative: 86.4 % (ref 40.0–80.0)

## 2015-09-12 LAB — BASIC METABOLIC PANEL
Anion Gap: 4 mmol/L (ref 3–16)
BUN: 8 mg/dL (ref 7–25)
CO2: 29 mmol/L (ref 21–33)
Calcium: 9.3 mg/dL (ref 8.6–10.3)
Chloride: 101 mmol/L (ref 98–110)
Creatinine: 0.66 mg/dL (ref 0.60–1.30)
Glucose: 100 mg/dL (ref 70–100)
Osmolality, Calculated: 276 mosm/kg — ABNORMAL LOW (ref 278–305)
Potassium: 3.5 mmol/L (ref 3.5–5.3)
Sodium: 134 mmol/L (ref 133–146)
eGFR AA CKD-EPI: >90 See note.
eGFR NONAA CKD-EPI: >90 See note.

## 2015-09-12 LAB — HCG URINE, QUALITATIVE: Preg Test, Ur: NEGATIVE

## 2015-09-12 LAB — CBC
Hematocrit: 40 % (ref 35.0–45.0)
Hemoglobin: 13 g/dL (ref 11.7–15.5)
MCH: 28.1 pg (ref 27.0–33.0)
MCHC: 32.5 g/dL (ref 32.0–36.0)
MCV: 86.4 fL (ref 80.0–100.0)
MPV: 8.1 fL (ref 7.5–11.5)
Platelets: 248 10*3/uL (ref 140–400)
RBC: 4.63 10*6/uL (ref 3.80–5.10)
RDW: 12.9 % (ref 11.0–15.0)
WBC: 12.6 10*3/uL (ref 3.8–10.8)

## 2015-09-12 LAB — URINALYSIS W/RFL TO MICROSCOPIC
Bilirubin, UA: NEGATIVE
Glucose, UA: 50 mg/dL — AB
Ketones, UA: NEGATIVE mg/dL
Nitrite, UA: NEGATIVE
Protein, UA: NEGATIVE mg/dL
RBC, UA: 4 /HPF — ABNORMAL HIGH (ref 0–3)
Specific Gravity, UA: 1.015 (ref 1.005–1.035)
Squam Epithel, UA: 2 /HPF (ref 0–5)
Urobilinogen, UA: <2 mg/dL (ref 0.2–1.9)
WBC, UA: 1 /HPF (ref 0–5)
pH, UA: 7 (ref 5.0–8.0)

## 2015-09-12 LAB — LACTIC ACID: Lactate: 1 mmol/L (ref 0.5–2.2)

## 2015-09-12 LAB — VENOUS BLOOD GAS, LINE/SYRINGE
%HBO2-Line Draw: 52.3 % (ref 40.0–70.0)
Base Excess-Line Draw: 4.6 mmol/L (ref <=3.0)
CO2 Content-Line Draw: 32 mmol/L (ref 25–29)
Carboxyhgb-Line Draw: 1 %
HCO3-Line Draw: 30 mmol/L (ref 24–28)
Methemoglobin-Line Draw: 0.6 % (ref 0.0–1.5)
PCO2-Line Draw: 52 mm Hg (ref 41–51)
PH-Line Draw: 7.38 (ref 7.32–7.42)
PO2-Line Draw: 30 mm Hg (ref 25–40)
Reduced Hemoglobin-Line Draw: 46.1 % (ref 0.0–5.0)

## 2015-09-12 LAB — LIPASE: Lipase: 13 U/L (ref 4–82)

## 2015-09-12 MED ORDER — OMNIPAQUE (iohexol) 350 mg iodine/mL 125 mL
350 | Freq: Once | INTRAVENOUS | Status: AC | PRN
Start: 2015-09-12 — End: 2015-09-12
  Administered 2015-09-12: 23:00:00 125 mL via INTRAVENOUS

## 2015-09-12 MED ORDER — metroNIDAZOLE (FLAGYL) 500 MG tablet
500 | ORAL_TABLET | Freq: Two times a day (BID) | ORAL | Status: AC
Start: 2015-09-12 — End: 2015-09-19

## 2015-09-12 MED ORDER — ketorolac (TORADOL) injection 15 mg
15 | Freq: Once | INTRAMUSCULAR | Status: AC
Start: 2015-09-12 — End: 2015-09-12
  Administered 2015-09-13: 15 mg via INTRAVENOUS

## 2015-09-12 MED ORDER — metroNIDAZOLE (FLAGYL) in sodium chloride, iso-osm IVPB 500 mg
500 | Freq: Once | INTRAVENOUS | Status: AC
Start: 2015-09-12 — End: 2015-09-12
  Administered 2015-09-13: 02:00:00 500 mg via INTRAVENOUS

## 2015-09-12 MED ORDER — HYDROcodone-acetaminophen (NORCO) 5-325 mg per tablet
5-325 | ORAL_TABLET | Freq: Four times a day (QID) | ORAL | 0.00 refills | 15.50000 days | Status: AC | PRN
Start: 2015-09-12 — End: 2015-11-13

## 2015-09-12 MED ORDER — sodium chloride 0.9 % infusion
INTRAVENOUS | Status: AC
Start: 2015-09-12 — End: 2015-09-12

## 2015-09-12 MED ORDER — ciprofloxacin HCl (CIPRO) 500 MG tablet
500 | ORAL_TABLET | Freq: Two times a day (BID) | ORAL | Status: AC
Start: 2015-09-12 — End: 2015-09-19

## 2015-09-12 MED ORDER — ciprofloxacin (CIPRO) 400 mg in dextrose 5% 200 mL IVPB
400 | Freq: Once | INTRAVENOUS | Status: AC
Start: 2015-09-12 — End: 2015-09-12
  Administered 2015-09-13: 400 mg via INTRAVENOUS

## 2015-09-12 MED ORDER — morphine injection 4 mg
4 | Freq: Once | INTRAMUSCULAR | Status: AC
Start: 2015-09-12 — End: 2015-09-12
  Administered 2015-09-12: 23:00:00 4 mg via INTRAVENOUS

## 2015-09-12 MED ORDER — senna-docusate (SENNA-S) 8.6-50 mg per tablet
8.6-50 | ORAL_TABLET | Freq: Every evening | ORAL | Status: AC | PRN
Start: 2015-09-12 — End: 2015-11-13

## 2015-09-12 MED ORDER — ondansetron (ZOFRAN, AS HYDROCHLORIDE,) 4 MG tablet
4 | ORAL_TABLET | Freq: Four times a day (QID) | ORAL | Status: AC | PRN
Start: 2015-09-12 — End: 2015-12-24

## 2015-09-12 MED ORDER — metoclopramide HCl (REGLAN) injection 10 mg
5 | Freq: Once | INTRAMUSCULAR | Status: AC
Start: 2015-09-12 — End: 2015-09-12

## 2015-09-12 MED ORDER — lactated ringers 1,000 mL IV fluid
Freq: Once | INTRAVENOUS | Status: AC
Start: 2015-09-12 — End: 2015-09-12
  Administered 2015-09-12: 22:00:00 1000 mL via INTRAVENOUS

## 2015-09-12 MED ORDER — HYDROcodone-acetaminophen (NORCO) 5-325 mg per tablet 2 tablet
5-325 | Freq: Once | ORAL | Status: AC
Start: 2015-09-12 — End: 2015-09-12
  Administered 2015-09-13: 2 via ORAL

## 2015-09-12 MED FILL — SODIUM CHLORIDE 0.9 % INTRAVENOUS SOLUTION: INTRAVENOUS | Qty: 50

## 2015-09-12 MED FILL — KETOROLAC 15 MG/ML INJECTION SOLUTION: 15 15 mg/mL | INTRAMUSCULAR | Qty: 1

## 2015-09-12 MED FILL — METRONIDAZOLE 500 MG/100 ML IN SODIUM CHLOR(ISO) INTRAVENOUS PIGGYBACK: 500 500 mg/100 mL | INTRAVENOUS | Qty: 100

## 2015-09-12 MED FILL — HYDROCODONE 5 MG-ACETAMINOPHEN 325 MG TABLET: 5-325 5-325 mg | ORAL | Qty: 2

## 2015-09-12 MED FILL — MORPHINE 4 MG/ML INJECTION SYRINGE: 4 4 mg/mL | INTRAMUSCULAR | Qty: 1

## 2015-09-12 MED FILL — CIPROFLOXACIN 400 MG/200 ML IN 5 % DEXTROSE INTRAVENOUS PIGGYBACK: 400 400 mg/200 mL | INTRAVENOUS | Qty: 200

## 2015-09-12 MED FILL — LACTATED RINGERS INTRAVENOUS SOLUTION: 1000.00 1000.00 mL | INTRAVENOUS | Qty: 1000

## 2015-09-12 NOTE — Unmapped (Signed)
LLQ abdominal pain yesterday with fever of 101.5. Tuesday had diarrhea. Pain spread from LLQ to mid abdomen.

## 2015-09-12 NOTE — Unmapped (Signed)
[place] ED Note    09/12/2015    Reason for Visit: Abdominal Pain        Patient History     HPI:  Mary [name] is a 44 y.o. White or Caucasian female with a PMH as described below who presents to the ED today with a chief complaint ofLeft lower quadrant abdominal pain.  The patient states that over the past 2-3 days, she has developed worsening left lower quadrant abdominal pain worse with movement.  She states that the pain is now radiating up into the epigastric region of her abdomen.  She notes a fever of 101.5 yesterday and had one episode of diarrhea on Tuesday.  She denies any right lower quadrant pain, nausea, vomiting, dysuria, chest pain, shortness of breath, weakness, numbness, tingling, swelling to her extremity.  She states that her last menstrual cycle was approximately 2 weeks ago and was otherwise normal for her and she denies any vaginal discharge or bleeding.  She states that she has never had pain like this before but does note that she thinks that her mother had diverticulitis at one point.  She denies any family history of IBD/IBS.    The patient states that there were no other associated signs and symptoms or modifying factors.    Patient rates pain as 7/10.    Past Medical History   Diagnosis Date   ??? Environmental allergies    ??? Asthma    ??? Chronic back pain    ??? Cervical dysplasia    ??? Anxiety    ??? Depression    ??? ADD (attention deficit disorder)    ??? Varicella        Past Surgical History   Procedure Laterality Date   ??? Cervical cone biopsy     ??? Arthroscopy knee menisectomy, meniscal repair       right   ??? Augmentation breast endoscopic         Social History     Social History   ??? Marital Status: Married     Spouse Name: N/A   ??? Number of Children: N/A   ??? Years of Education: N/A     Occupational History   ??? Not on file.     Social History Main Topics   ??? Smoking status: Never Smoker    ??? Smokeless tobacco: Never Used      Comment:  02/24/2011; passive smoke exposure:no (06/18/2005)   ??? Alcohol Use: 1.2 oz/week     2 Glasses of wine per week   ??? Drug Use: No      Comment: 01/07/2011   ??? Sexual Activity: Not on file     Other Topics Concern   ??? Occupational Exposure No   ??? Exercise Yes     maybe 2 days a week   ??? Seat Belt Yes     Social History Narrative    Currently not working. Lives in a newer home with husband and 2 kids, no known mold in the home, no pets at home, no occupational exposures.        family history includes Allergies in her other; Asthma in her father and other; Bone cancer in her other; Cancer in her mother; Hypertension in her father. There is no history of Melanoma.    No Known Allergies    Previous Medications    ALBUTEROL (PROAIR HFA) 90 MCG/ACTUATION INHALER    Inhale 1-2 puffs into the lungs every 4 hours as needed for Wheezing.  q4-6hrs PRN    ALBUTEROL (PROVENTIL) 2.5 MG /3 ML (0.083 %) NEBULIZER SOLUTION    Inhale 3 mLs (2.5 mg total) by nebulization every 6 hours as needed for Wheezing.    CETIRIZINE (ZYRTEC) 1 MG/ML SYRUP    Take 10 mLs (10 mg total) by mouth daily.    DEXTROAMPHETAMINE-AMPHETAMINE (ADDERALL) 10 MG TAB    Take 1 tablet (10 mg total) by mouth 3 times a day.    DEXTROAMPHETAMINE-AMPHETAMINE (ADDERALL) 10 MG TAB    Take 1 tablet (10 mg total) by mouth 3 times a day.    ESCITALOPRAM OXALATE (LEXAPRO) 10 MG TABLET    Take 1 tablet (10 mg total) by mouth daily.    FLUTICASONE (FLONASE) 50 MCG/ACTUATION NASAL SPRAY    Use 1-2 sprays into each nostril daily as needed.    FLUTICASONE-SALMETEROL (ADVAIR DISKUS) 500-50 MCG/DOSE DSDV    Inhale 1 puff into the lungs 2 times a day.    MONTELUKAST (SINGULAIR) 10 MG TABLET    Take 1 tablet (10 mg total) by mouth every morning.    OMEPRAZOLE (PRILOSEC) 40 MG CAPSULE    Take 1 capsule (40 mg total) by mouth every morning before breakfast.         All nursing notes and triage notes were appropriately reviewed in the course of the creation of this note.     Review of  Systems     ROS:  As described above in the HPI otherwise all the systems reviewed and negative as reported by the patient.      Physical Exam     Filed Vitals:    09/12/15 1525 09/12/15 1715 09/12/15 1747 09/12/15 1848   BP:  115/58 120/73 120/65   Pulse: 112  111 106   Temp: 99 ??F (37.2 ??C)      TempSrc: Oral      Resp: 16  16 16    Height: 5' 6 (1.676 m)      Weight: 140 lb (63.504 kg)      SpO2: 99% 100% 100% 98%         Physical Exam   Constitutional: She is oriented to person, place, and time. She appears well-developed and well-nourished.   HENT:   Head: Normocephalic and atraumatic.   Eyes: EOM are normal. Pupils are equal, round, and reactive to light.   Neck: Neck supple.   Cardiovascular: Regular rhythm and normal heart sounds.    Tachycardic   Pulmonary/Chest: Effort normal and breath sounds normal. She has no wheezes. She exhibits no tenderness.   Abdominal: Soft. Bowel sounds are normal. She exhibits no distension. There is tenderness (moderate amount of reproducible left lower quadrant abdominal tenderness with deep palpation.  No rebound tenderness or involuntary guarding.  No psoas/obturator sign negative.  No right lower quadrant tenderness.  Logroll negative bilaterally.).   Musculoskeletal: Normal range of motion. She exhibits no edema or tenderness.   Neurological: She is alert and oriented to person, place, and time.   Skin: Skin is warm.   Psychiatric: She has a normal mood and affect.   Nursing note and vitals reviewed.          Diagnostic Studies     Labs:    Please see electronic medical record for any tests performed in the ED     Labs Reviewed   CBC - Abnormal; Notable for the following:     WBC 12.6 (*)     All other components within normal limits  DIFFERENTIAL - Abnormal; Notable for the following:     Neutrophils Relative 86.4 (*)     Lymphocytes Relative 5.7 (*)     Neutrophils Absolute 21308 (*)     Lymphocytes Absolute 718 (*)     All other components within normal limits   BASIC  METABOLIC PANEL - Abnormal; Notable for the following:     Osmolality, Calculated 276 (*)     All other components within normal limits    Narrative:     As of 12/03/2014 the estimated GFR is calculated from serum creatinine using the Chronic Kidney Disease  Epidemiology Collaboration (CKD-EPI) equation in patients 18 years and older.  The reference range is   >60 mL/min/1.58m2.  eGFR values greater than 90 will be reported as >55mL/min/1.73m2.  Reference: Cherie Dark AS, Suella Grove [facility], Stefano Gaul AF, 3rd, Feldman HI, et. al.  A new equation to estimate glomerular filtration rate.  Ann Intern Med. 2009:150(9):604-12   HEPATIC FUNCTION PANEL - Abnormal; Notable for the following:     AST 12 (*)     All other components within normal limits   URINALYSIS W/RFL TO MICROSCOP, NO CULT - Abnormal; Notable for the following:     Glucose, UA 50 (*)     Blood, UA Small (*)     Leukocytes, UA Trace (*)     RBC, UA 4 (*)     Bacteria, UA Rare (*)     Mucus, UA Present (*)     All other components within normal limits   VENOUS BLOOD GAS, LINE/SYRINGE - Abnormal; Notable for the following:     PCO2-Line Draw 52 (*)     HCO3-Line Draw 30 (*)     CO2 Content-Line Draw 32 (*)     Base Excess-Line Draw 4.6 (*)     Reduced Hemoglobin-Line Draw 46.1 (*)     All other components within normal limits   LIPASE   HCG URINE, QUALITATIVE   LACTIC ACID       Radiology:    Please see electronic medical record for any tests performed in the ED    CT ABDOMEN AND PELVIS WITH IV CONTRAST    EKG:    No EKG Performed      Emergency Department Procedures         ED Course and MDM     Mary [name] is a 44 y.o. White or Caucasian female  who presented to the emergency department with Abdominal Pain   who was examined by myself.Examination, the patient has reproducible left lower quadrant abdominal tenderness in the setting of fever, diarrhea.  Her belly is moderately tender without any rebound tenderness or involuntary guarding my concern  for peritonitis is very low.  She does not have any right lower quadrant tenderness or right upper quadrant tenderness on my concern for appendicitis, cholecystitis, or pancreatitis is very low based on clinical history and physical examination.  The patient's symptoms are most consistent with diverticulitis however she does not have any prior history of this.  Physical laboratory studies were performed and the patient did have a mild leukocytosis and on CT scan there was evidence of uncomplicated sigmoid diverticulitis which fits very well with the patient's clinical history and physical examination.  She was given a dose of Cipro and Flagyl in the emergency department and given pain medication and IV fluids with improvement in her tachycardia.  At this time I believe that she is safe for discharge home  with strict return precautions regarding diverticulitis with a prescription for Cipro and Flagyl.  She was instructed to follow up with her primary care physician and given contact information of follow-up with gastroenterology if she wishes to pursue further follow-up as she is relatively young for this diagnosis.    The patient was given my strict and standard return precautions as well as return precautions specific to the presenting diagnosis.  The patient voiced understanding with the above plan and was discharged home in good condition.    ED Treatment:  Medications   sodium chloride 0.9 % infusion (0 mLs  Stopped 09/12/15 1755)   ciprofloxacin (CIPRO) 400 mg in dextrose 5% 200 mL IVPB (not administered)   metroNIDAZOLE (FLAGYL) in sodium chloride, iso-osm IVPB 500 mg (not administered)   ketorolac (TORADOL) injection 15 mg (not administered)   HYDROcodone-acetaminophen (NORCO) 5-325 mg per tablet 2 tablet (not administered)   lactated ringers 1,000 mL IV fluid (0 mLs Intravenous Stopped 09/12/15 1729)   morphine injection 4 mg (4 mg Intravenous Given 09/12/15 1808)   OMNIPAQUE (iohexol) 350 mg iodine/mL 125 mL  (125 mLs Intravenous Given 09/12/15 1754)       Impression:  1)Diverticulitis    Plan:  1) discharge home in good condition with strict return precautions regarding diverticulitis, discharge with pressure for Cipro and Flagyl and short course of pain medication was instructed to follow-up with her primary care physician, contact information for gastric urology if she wishes to pursue further care is given to the patient.     Laylana, Gerwig   Home Medication Instructions [id number]    Printed on:09/12/15 1621   Medication Information                      albuterol (PROAIR HFA) 90 mcg/actuation inhaler  Inhale 1-2 puffs into the lungs every 4 hours as needed for Wheezing. q4-6hrs PRN             albuterol (PROVENTIL) 2.5 mg /3 mL (0.083 %) nebulizer solution  Inhale 3 mLs (2.5 mg total) by nebulization every 6 hours as needed for Wheezing.             cetirizine (ZYRTEC) 1 mg/mL syrup  Take 10 mLs (10 mg total) by mouth daily.             dextroamphetamine-amphetamine (ADDERALL) 10 mg Tab  Take 1 tablet (10 mg total) by mouth 3 times a day.             dextroamphetamine-amphetamine (ADDERALL) 10 mg Tab  Take 1 tablet (10 mg total) by mouth 3 times a day.             escitalopram oxalate (LEXAPRO) 10 MG tablet  Take 1 tablet (10 mg total) by mouth daily.             fluticasone (FLONASE) 50 mcg/actuation nasal spray  Use 1-2 sprays into each nostril daily as needed.             fluticasone-salmeterol (ADVAIR DISKUS) 500-50 mcg/dose DsDv  Inhale 1 puff into the lungs 2 times a day.             montelukast (SINGULAIR) 10 mg tablet  Take 1 tablet (10 mg total) by mouth every morning.             omeprazole (PRILOSEC) 40 MG capsule  Take 1 capsule (40 mg total) by mouth every morning before breakfast.  Future Appointments  Date Time Provider Department Center   11/13/2015 10:45 AM Melissa Montane, MD [facility]         Critical Care Time (Attendings)             Rudi Rummage, MD  09/12/15  [phone]

## 2015-09-15 NOTE — Unmapped (Signed)
Left message to contact me about scheduling a follow up from ED visit on 09/12/2015

## 2015-11-13 ENCOUNTER — Ambulatory Visit: Admit: 2015-11-13 | Payer: PRIVATE HEALTH INSURANCE

## 2015-11-13 DIAGNOSIS — F988 Other specified behavioral and emotional disorders with onset usually occurring in childhood and adolescence: Secondary | ICD-10-CM

## 2015-11-13 MED ORDER — dextroamphetamine-amphetamine (ADDERALL) 10 mg Tab
10 | ORAL_TABLET | Freq: Three times a day (TID) | ORAL | Status: AC
Start: 2015-11-13 — End: 2016-02-27

## 2015-11-13 MED ORDER — dextroamphetamine-amphetamine (ADDERALL) 10 mg Tab
10 | ORAL_TABLET | Freq: Three times a day (TID) | ORAL | Status: AC
Start: 2015-11-13 — End: 2015-11-13

## 2015-11-13 NOTE — Unmapped (Signed)
Subjective  HPI:   Patient ID: Mary [name] is a 45 y.o. female.    Chief Complaint:  HPI         The patient's chart was reviewed prior to the visit as part of pre-visit planning  Chief Complaint   Patient presents with   ??? Medication Refill     adderall, oarrs consistent and documented, last fill 11/10/15      Problems:fu ADD    Doing well c/w meds : takes adderall  10mg  1-3x/d, on average 20mg    In the past reported had developed some tolerance w it and wanting to get off it,  Long term plan taper down   Denies any s/e x fatigue  Needs refills  Anx/depn better w lexapro: some fatigue, doing better w nite time dosing   Stressors: just trying to gradly dec dosing v adderall       Past Medical History   Diagnosis Date   ??? Environmental allergies    ??? Asthma    ??? Chronic back pain    ??? Cervical dysplasia    ??? Anxiety    ??? Depression    ??? ADD (attention deficit disorder)    ??? Varicella    ??? Diverticulitis        Current Outpatient Prescriptions   Medication Sig Dispense Refill   ??? albuterol (PROAIR HFA) 90 mcg/actuation inhaler Inhale 1-2 puffs into the lungs every 4 hours as needed for Wheezing. q4-6hrs PRN 2 Inhaler 12   ??? albuterol (PROVENTIL) 2.5 mg /3 mL (0.083 %) nebulizer solution Inhale 3 mLs (2.5 mg total) by nebulization every 6 hours as needed for Wheezing. 90 vial 6   ??? cetirizine (ZYRTEC) 1 mg/mL syrup Take 10 mLs (10 mg total) by mouth daily. 118 mL 12   ??? dextroamphetamine-amphetamine (ADDERALL) 10 mg Tab Take 1 tablet (10 mg total) by mouth 3 times a day. 90 tablet 0   ??? dextroamphetamine-amphetamine (ADDERALL) 10 mg Tab Take 1 tablet (10 mg total) by mouth 3 times a day. 90 tablet 0   ??? escitalopram oxalate (LEXAPRO) 10 MG tablet Take 1 tablet (10 mg total) by mouth daily. 30 tablet 5   ??? fluticasone (FLONASE) 50 mcg/actuation nasal spray Use 1-2 sprays into each nostril daily as needed. 16 g 12   ??? fluticasone-salmeterol (ADVAIR DISKUS) 500-50 mcg/dose DsDv Inhale 1 puff into the lungs 2 times  a day. 1 Inhaler 12   ??? montelukast (SINGULAIR) 10 mg tablet Take 1 tablet (10 mg total) by mouth every morning. 30 tablet 12   ??? omeprazole (PRILOSEC) 40 MG capsule Take 1 capsule (40 mg total) by mouth every morning before breakfast. 30 capsule 12   ??? ondansetron (ZOFRAN, AS HYDROCHLORIDE,) 4 MG tablet Take 1 tablet (4 mg total) by mouth every 6 hours as needed for Nausea. 16 tablet 0     No current facility-administered medications for this visit.          ROS:   Review of Systems  as above   Objective:   Physical Exam      Wt Readings from Last 3 Encounters:   11/13/15 144 lb (65.318 kg)   09/12/15 140 lb (63.504 kg)   09/03/15 142 lb (64.411 kg)         Filed Vitals:    11/13/15 1101   BP: 134/88   Pulse: 67   Temp: 98.1 ??F (36.7 ??C)   TempSrc: Oral   Height: 5' 6 (1.676 m)  Weight: 144 lb (65.318 kg)   SpO2: 99%     Body mass index is 23.25 kg/(m^2).  Body surface area is 1.74 meters squared.    NAD  Psych: A&Ox3; reactive affect; no obv thought d/o        No results found for: HGBA1C  No components found for: GLUF,  MICROALBUR,  LDLCALC,  CREATININE    Lab Results   Component Value Date    GLUCOSE 100 09/12/2015       No results found for: LIPIDCOMM          Assessment/Plan:     Time in: 1105  Time out:      15 mins, Greater than 50% of time spent in face to face counselling/coordination of care      A/P:  1. ADD- oarrs report:  no problems; uds done in the past: no red flags;  IR adderall: cont w 30mg /d, cont lexapro as well    RTC: 3 months    New meds:   none  Patient education given  none  Patient verbalized understanding & agreement of the proposed plan.

## 2015-12-24 ENCOUNTER — Encounter: Admit: 2015-12-24 | Payer: PRIVATE HEALTH INSURANCE | Attending: Gastroenterology

## 2015-12-24 NOTE — Unmapped (Signed)
Pt left before OV could be completed.

## 2015-12-24 NOTE — Unmapped (Deleted)
GI CONSULT    Referring Physician: Carlos American  Consult Attending:Jenyfer Trawick    12/24/2015  10:41 AM    Mary [name] is an 45 y.o. female being seen at the request of Dr. Carlos American for difficulties.        ROS: Pt denies any chest pain, SOB, lightheadedness, dizziness, fatigue, dysuria, rash, joint or eye pain, oral lesions, bruising, bleeding.      History:  Past Medical History   Diagnosis Date   ??? Environmental allergies    ??? Asthma    ??? Chronic back pain    ??? Cervical dysplasia    ??? Anxiety    ??? Depression    ??? ADD (attention deficit disorder)    ??? Varicella    ??? Diverticulitis        Past Surgical History   Procedure Laterality Date   ??? Cervical cone biopsy     ??? Arthroscopy knee menisectomy, meniscal repair       right   ??? Augmentation breast endoscopic         No Known Allergies      Current outpatient prescriptions:   ???  albuterol (PROAIR HFA) 90 mcg/actuation inhaler, Inhale 1-2 puffs into the lungs every 4 hours as needed for Wheezing. q4-6hrs PRN, Disp: 2 Inhaler, Rfl: 12  ???  albuterol (PROVENTIL) 2.5 mg /3 mL (0.083 %) nebulizer solution, Inhale 3 mLs (2.5 mg total) by nebulization every 6 hours as needed for Wheezing., Disp: 90 vial, Rfl: 6  ???  cetirizine (ZYRTEC) 1 mg/mL syrup, Take 10 mLs (10 mg total) by mouth daily., Disp: 118 mL, Rfl: 12  ???  dextroamphetamine-amphetamine (ADDERALL) 10 mg Tab, Take 1 tablet (10 mg total) by mouth 3 times a day., Disp: 90 tablet, Rfl: 0  ???  dextroamphetamine-amphetamine (ADDERALL) 10 mg Tab, Take 1 tablet (10 mg total) by mouth 3 times a day., Disp: 90 tablet, Rfl: 0  ???  escitalopram oxalate (LEXAPRO) 10 MG tablet, Take 1 tablet (10 mg total) by mouth daily., Disp: 30 tablet, Rfl: 5  ???  fluticasone-salmeterol (ADVAIR DISKUS) 500-50 mcg/dose DsDv, Inhale 1 puff into the lungs 2 times a day., Disp: 1 Inhaler, Rfl: 12  ???  montelukast (SINGULAIR) 10 mg tablet, Take 1 tablet (10 mg total) by mouth every morning., Disp: 30 tablet, Rfl: 12  ???  omeprazole (PRILOSEC) 40 MG  capsule, Take 1 capsule (40 mg total) by mouth every morning before breakfast., Disp: 30 capsule, Rfl: 12    SH:      Occupation:   Marital status:   Tobacco use:   EtOH use:   Illicit drugs:   Transfusion:   Tattoo:   Body piercing:     FH:      Cancers: Colon: No  Uterus: No  Ovarian: No  Breast: No   Colon polyps: No   IBD: No   Celiac: No      PE:   @PATIENTWT @    Labs:   Current labs and radiology reviewed in EPIC    Assessment/Plan:  45 y.o. female with        Mary Oppenheim, MD  12/24/2015

## 2015-12-29 NOTE — Unmapped (Signed)
Chart prep - Opened office visit instead of using an orders only encounter. Error Please Disregard

## 2016-01-12 NOTE — Unmapped (Signed)
Pt was hoping to be seen as soon as possible by either Dr Thedore Mins or Dr August Saucer.   The Pt states that she is on day 8 of a low grade fever and the only other symptoms are fatigue and dizziness at times.  Asks for a callback to advise [phone]

## 2016-01-14 NOTE — Unmapped (Signed)
Spoke with pt about symptoms. States that fever comes and goes and is lowgrade if any. Still having dizziness and fatigue. Offered sick appt today with Dr. Thedore Mins. Pt declined due to previous arrangements. Said she is feeling a little better sofar today and will see how the day goes. If symptoms persist thru today, advised pt to call us back for appt.

## 2016-02-27 ENCOUNTER — Ambulatory Visit: Admit: 2016-02-27 | Payer: PRIVATE HEALTH INSURANCE

## 2016-02-27 DIAGNOSIS — F988 Other specified behavioral and emotional disorders with onset usually occurring in childhood and adolescence: Secondary | ICD-10-CM

## 2016-02-27 MED ORDER — escitalopram oxalate (LEXAPRO) 10 MG tablet
10 | ORAL_TABLET | Freq: Every day | ORAL | 1.00 refills | 30.00000 days | Status: AC
Start: 2016-02-27 — End: 2017-03-05

## 2016-02-27 MED ORDER — dextroamphetamine-amphetamine (ADDERALL) 10 mg Tab
10 | ORAL_TABLET | Freq: Three times a day (TID) | ORAL | Status: AC
Start: 2016-02-27 — End: 2016-06-25

## 2016-02-27 MED ORDER — dextroamphetamine-amphetamine (ADDERALL) 10 mg Tab
10 | ORAL_TABLET | Freq: Three times a day (TID) | ORAL | Status: AC
Start: 2016-02-27 — End: 2016-02-27

## 2016-02-27 MED ORDER — dextroamphetamine-amphetamine (ADDERALL) 10 mg Tab
10 | ORAL_TABLET | Freq: Three times a day (TID) | ORAL | Status: AC
Start: 2016-02-27 — End: 2016-06-02

## 2016-02-27 NOTE — Unmapped (Signed)
If you haven't had your annual well exam in the past 12 months do call to set this up.

## 2016-02-27 NOTE — Unmapped (Signed)
Subjective  HPI:   Patient ID: Mary [name] is a 45 y.o. female.    Chief Complaint:  HPI         The patient's chart was reviewed prior to the visit as part of pre-visit planning  Chief Complaint   Patient presents with   ??? Medication Refill     adderall      Problems:fu ADD    Doing well c/w meds : takes adderall  10mg  2-4x/d, on average 20mg , sometimes i get super tired and cant concentrate in the afternoon and may have to increase to 3 or 4 tablets, but not too often    In the past reported had developed some tolerance w it and wanting to get off it,  Denies any s/e x occly f/x sleep, esp if taking 40mg   Needs refills  Anx/depn better w lexapro: doing better        Past Medical History   Diagnosis Date   ??? Environmental allergies    ??? Asthma    ??? Chronic back pain    ??? Cervical dysplasia    ??? Anxiety    ??? Depression    ??? ADD (attention deficit disorder)    ??? Varicella    ??? Diverticulitis        Current Outpatient Prescriptions   Medication Sig Dispense Refill   ??? albuterol (PROAIR HFA) 90 mcg/actuation inhaler Inhale 1-2 puffs into the lungs every 4 hours as needed for Wheezing. q4-6hrs PRN 2 Inhaler 12   ??? albuterol (PROVENTIL) 2.5 mg /3 mL (0.083 %) nebulizer solution Inhale 3 mLs (2.5 mg total) by nebulization every 6 hours as needed for Wheezing. 90 vial 6   ??? cetirizine (ZYRTEC) 1 mg/mL syrup Take 10 mLs (10 mg total) by mouth daily. 118 mL 12   ??? dextroamphetamine-amphetamine (ADDERALL) 10 mg Tab Take 1 tablet (10 mg total) by mouth 3 times a day. 90 tablet 0   ??? escitalopram oxalate (LEXAPRO) 10 MG tablet Take 1 tablet (10 mg total) by mouth daily. 30 tablet 5   ??? fluticasone-salmeterol (ADVAIR DISKUS) 500-50 mcg/dose DsDv Inhale 1 puff into the lungs 2 times a day. 1 Inhaler 12   ??? montelukast (SINGULAIR) 10 mg tablet Take 1 tablet (10 mg total) by mouth every morning. 30 tablet 12   ??? omeprazole (PRILOSEC) 40 MG capsule Take 1 capsule (40 mg total) by mouth every morning before breakfast. 30  capsule 12     No current facility-administered medications for this visit.          ROS:   Review of Systems  as above   Objective:   Physical Exam      Wt Readings from Last 3 Encounters:   02/27/16 147 lb (66.679 kg)   12/24/15 145 lb 3.2 oz (65.862 kg)   11/13/15 144 lb (65.318 kg)         Filed Vitals:    02/27/16 1449   BP: 114/88   Pulse: 89   Temp: 98.1 ??F (36.7 ??C)   TempSrc: Oral   Weight: 147 lb (66.679 kg)   SpO2: 98%     Body mass index is 23.74 kg/(m^2).  There is no height on file to calculate BSA.    NAD  Psych: A&Ox3; reactive affect; no obv thought d/o        No results found for: HGBA1C  No components found for: GLUF,  MICROALBUR,  LDLCALC,  CREATININE    Lab Results  Component Value Date    GLUCOSE 100 09/12/2015       No results found for: LIPIDCOMM          Assessment/Plan:     Time in: 253  Time out:  303    10 mins, Greater than 50% of time spent in face to face counselling/coordination of care      A/P:  1. ADD- oarrs report:  no problems; uds done in the past: no red flags;  IR adderall: cont w 20-40mg /d, cont lexapro as well    RTC: 3 months    New meds:   none  Patient education given  none  Patient verbalized understanding & agreement of the proposed plan.

## 2016-04-15 ENCOUNTER — Ambulatory Visit: Admit: 2016-04-15 | Discharge: 2016-04-15 | Payer: PRIVATE HEALTH INSURANCE | Attending: Family

## 2016-04-15 DIAGNOSIS — L812 Freckles: Secondary | ICD-10-CM

## 2016-04-15 NOTE — Unmapped (Signed)
Chief Complaint   Patient presents with   ??? Skin Exam   ??? Verrucous Vulgaris     both hands       HPI: Pt is 45 y.o.whitefemale with few tender lesions on fingers x few years. Wax and wanes. Have had LN2 in the past with improvement. No recent treatments.     Review of Systems   Skin as above.  Hematological: Does not bruise/bleed easily.   Allergies reviewed     Derm History:LOV: 07/05/14--per AC  (- ) Family history of melanoma  (- ) Personal history of NMSC or MM  (+ ) sunburns easily  (+ ) uses sunscreen  (+ ) history of tanning bed use    Physical Examination:FSE    Examination was performed of the following: scalp/hair, head/face, conjunctivae/eyelids, gums/teeth/lips, neck, breast/axilla/chest, abdomen, back, RUE, LUE, RLE, LLE, nails/digits and genitalia/groin/buttocks    Abnormalities noted include: LLE and nails/digits     1. Rt ring finger x 1, lt index finger x 2 and lt thigh x 1 with flat topped pink papules  2. Body with many scattered brown macules    A & P:     1. Plana verruca  Plan:  -Will treat with LN2 today in office to prevent worsening of condition.  LN2 x 4  to the skin lesions and should expect blistering or scabbing reaction. Do not pick at the area. Patient reminded to expect hypopigmented scars from the procedure. Return if lesion fails to fully resolve. Apply vaseline until area resolves (about 1 week)       2. Ephelides  Plan:  -Educated on ABCDE of changing lesions- RTC for any changes  -Patient instructed to regularly use sunscreen. Recommended SPF 30.  -annual FSE- perform monthly home self exams- may take pictures to compare

## 2016-04-23 ENCOUNTER — Inpatient Hospital Stay: Admit: 2016-04-23 | Payer: PRIVATE HEALTH INSURANCE

## 2016-04-23 DIAGNOSIS — R920 Mammographic microcalcification found on diagnostic imaging of breast: Secondary | ICD-10-CM

## 2016-04-23 DIAGNOSIS — N6489 Other specified disorders of breast: Secondary | ICD-10-CM

## 2016-04-30 NOTE — Unmapped (Signed)
[facility]  Care after a Needle Core Biopsy    1.  Apply ice/cold constantly to the breast after your procedure until bedtime to relieve swelling and bruising.    2. Some bruising is normal after a biopsy.  It may be present when you remove the dressing or may appear within the next 48 hours.  It will disappear in a few days to a couple of weeks later.    3. Warm moist ???hot packs??? on the breast the next day will help any discomfort and bruising.  Use a hand towel.  Dip one end in very warm water out of the faucet.  Place the moist end on the breast and cover with the dry end of the towel.  Do not use a heating pad or the microwave to heat a moist towel.    4. The local anesthetic wears off about 3 hours after the biopsy.  You may use Tylenol (acetaminophen) every four-six hours for pain as needed.  Do not take aspirin or Ibuprofen for 48 hours after the procedure.  Some discomfort is not unusual.    5. You may return to work after the biopsy, but do not perform any heavy lifting.  Strenuous exercise, weight lifting, skiing, tennis, golf, etc. should be limited for 24-48 hours after the biopsy.    6. Avoid swimming or use of a hot tub until steristrips are removed.    7. You may remove the plastic bandage and gauze tomorrow morning, after you shower but keep the thin strips of tape in place for a week (7 days) if they do not come off by themselves before then.      8. For several days to several weeks you may have some tenderness, ???twinges??? and a little bump where the needle went into the skin.  This can be bothersome but it is not abnormal and it will go away.    9. If you notice any excessive bleeding or swelling, pain, fever, heat or redness around the biopsy site, please call [phone] during normal business hours.    10. The final results of your biopsy are usually available in five to seven working days.  You will be contacted with your results by telephone.    11. Feel free to contact the [facility] with any questions or concerns at the number provided above.    12. If you have a problem after normal business hours, please contact our Breast Fellow by pager by dialing [phone] - you will hear a beep - you put in your phone number with area code and then press the # and hang up.  The Fellow should call you back soon.

## 2016-05-03 ENCOUNTER — Inpatient Hospital Stay: Admit: 2016-05-03 | Payer: PRIVATE HEALTH INSURANCE

## 2016-05-03 DIAGNOSIS — N6031 Fibrosclerosis of right breast: Secondary | ICD-10-CM

## 2016-05-03 MED ORDER — lidocaine-EPINEPHrine (XYLOCAINE) 2 %-1:200,000 injection
2 | INTRAMUSCULAR | Status: AC
Start: 2016-05-03 — End: 2016-05-04

## 2016-05-03 MED ORDER — lidocaine (PF) (XYLOCAINE) 10 mg/mL (1 %) Soln
10 | INTRAMUSCULAR | Status: AC
Start: 2016-05-03 — End: 2016-05-03
  Administered 2016-05-03: 19:00:00

## 2016-05-03 MED ORDER — sodium chloride 0.9 % infusion
INTRAVENOUS | Status: AC
Start: 2016-05-03 — End: 2016-05-03
  Administered 2016-05-03: 19:00:00

## 2016-05-03 MED FILL — SODIUM CHLORIDE 0.9 % INTRAVENOUS SOLUTION: INTRAVENOUS | Qty: 500

## 2016-05-03 MED FILL — XYLOCAINE-MPF 10 MG/ML (1 %) INJECTION SOLUTION: 10 10 mg/mL (1 %) | INTRAMUSCULAR | Qty: 30

## 2016-05-03 MED FILL — LIDOCAINE 20 MG/ML (2 %)-EPINEPHRINE 1:200,000 INJECTION SOLUTION: 2 2 %-1:200,000 | INTRAMUSCULAR | Qty: 40

## 2016-05-10 ENCOUNTER — Ambulatory Visit: Admit: 2016-05-10 | Discharge: 2016-05-10 | Payer: PRIVATE HEALTH INSURANCE

## 2016-05-10 DIAGNOSIS — N6091 Unspecified benign mammary dysplasia of right breast: Secondary | ICD-10-CM

## 2016-05-10 NOTE — Unmapped (Addendum)
Ms. Hegg is a 44y.o. Female referred by Dr. Nedra Hai for surgical consultation of RIGHT breast atypia. Patient underwent bilateral diagnostic mammogram and right breast ultrasound on 04/23/16 for a visible bulge in the right lateral breast. Imaging showed a fat lobule in the location of the bulge, but 2 small groups of fine amorphous microcalcifications in the upper right breast, with recommendation for stereotactic biopsy. Biopsy was performed on 05/03/16. Pathology revealed breast tissue with dense fibrosis and extensive FEA/ADH.     Patient given pre/post operative instructions. Surgery scheduled for 05/26/16 at 1200 at [facility]. Patient instructed to expect to arrive 3 hrs prior to surgery but will receive phone call from PAT with confirmed arrival time. Patient verbalized understanding and had no further questions at this time.

## 2016-05-10 NOTE — Unmapped (Signed)
Your next appointment with the office will be after your surgery.    If you have any questions leading up to your surgery or after please contact your physicians nurse.      Dr. Jaime Lewis staff contact information:     Adler Chartrand  Administrative Assistant    Ashley Walker, RN  Nurse Clinician   Ph# [phone]    Main Office Ph# [phone] (24 hour on call line)  Fax# [phone]  Hours of Operation  Monday through Friday  8 am to 5 pm    Pre-Procedure Instructions    We???re pleased that you have chosen [facility] for your upcoming procedure.  The staff serving you is professionally trained to provide the highest quality care.  We encourage you to ask questions and to let the staff know your special needs.  We want your visit to be as comfortable as possible.    Your procedure is scheduled on ____________ at _________   Please arrive at __________  check in at: Information desk on the first floor of the main hospital.    ??? DO NOT EAT OR DRINK ANYTHING (including gum, mints, water, etc.) after midnight the night before your procedure.  You may brush your teeth and gargle on the morning of surgery, but do not swallow any water.    ??? Please avoid aspirin or ibuprofen containing products 1 week prior to surgery .  Please notify your surgeon or nurse if you are taking any blood thinning medications as there may be special instructions regarding these medications.    ??? Please make transportation arrangements and bring a responsible adult to accompany you home and remain with you for 24 hours.  ??? Leave valuables (money, jewelry, credit cards) at home.  If you wear glasses or contacts, bring a case for safekeeping.  ??? Wear casual, loose fitting, and comfortable clothing.  A gown will be provided.  If you are staying overnight, bring a small overnight bag.  (Storage space is limited.)  ??? Please remove all makeup, jewelry, body piercings, powder, perfume, and nail polish before you arrive.  ??? Bring a list of  your medications and dose including herbal and over-the-counter medications.  Do not bring any pills or medications to the hospital. (Exception: transplant patients.)  ??? Bring a photo ID and your insurance card so we can bill your insurance company directly.  ??? Please do not bring any children under the age of 14 to the hospital.  ??? Do not shave in the area of the surgery for 2 days prior to surgery.  If needed, a trained staff member will clip the area immediately before your surgery.  ??? Quit smoking as far in advance of surgery as possible.  Patients who quit at least 30 days before surgery may have better outcomes.  ??? If you are diabetic, pay close attention to your blood sugar and try to keep it in the range your doctor wants it to be in.  If you have low blood sugar prior to coming in to the hospital you may drink a small amount of CLEAR SUGAR-CONTAINING FLUIDS WITHOUT PULP SUCH AS APPLE JUICE OR CLEAR SODA.  Depending on the procedure you are having this could impact the timing of your surgery.  ??? Discuss discontinuing herbal medications with your doctor before surgery.  ??? Please shower at home the evening before and the morning of surgery using an antiseptic agent, if provided, or antibacterial soap.  ??? If you suspect   that you have an infection prior to surgery, contact your doctor and surgeon prior to surgery.  Also tell the pre-surgery nurse on the day of surgery.    Additional instructions:    Contact information:    Department of Surgical Oncology [phone]  Surgeon: Dr. Jaime Lewis  RN Contact: Ashley Walker, RN [phone]

## 2016-05-10 NOTE — Unmapped (Signed)
History of Present Illness:   Mary [name] is a 45 y.o. 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 She states that she went for breast imaging as she noted a lump lateral to her right breast. 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 However, 2 clusters of calcifications were noted in her right breast. 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 Excisional biopsy of both clusters was recommended.    She does have bilateral pre-pectoral saline implants which were placed about 16 years ago through inframammary incisions (initial set about 23 years ago through periareolar implants). She is not sure that she has her implant cards and doesn't recall who placed them but it was in [place].     HPI           Breast History:   Race: Caucasian  Ashkenazi Jewish, [place], or Amish ancestry: no  Age of Menarche: 33  LMP: 05/04/16  Kathlyn [place] Aborta: 2/2/0  Age at first childbirth: 81  Assisted Reproduction Treatments: No  History of breast feeding: Yes- 4 months with 2nd child  History of hormonal contraceptives: Yes - 8-10 years, pill in 22s  Age of Menopause:  N/a- perimenopausal   History of HRT: No (If yes, how many years taken and what ages taken?)  Prior history of breast biopsy if known: No  Family history of breast or ovarian cancer: no (see family history section for details)  Family history of other cancers: yes (see family history section for details)  Family history of genetic testing and results: No  Patient interested in future childbearing: no  Patient interested in meeting with a fertility specialist: no    Signs/Symptoms/Duration of chief complaint: bulge in left lateral breast 1 week prior to mammogram   Last mammogram prior to diagnosis/most recent workup: none prior  Why did you go for your most recent imaging? Right breast change  Comments:     Histories:     She has a past  medical history of Environmental allergies; Asthma; Chronic back pain; Cervical dysplasia; Anxiety; Depression; ADD (attention deficit disorder); Varicella; and Diverticulitis.    She has past surgical history that includes Cervical cone biopsy; arthroscopy knee menisectomy, meniscal repair; and augmentation breast endoscopic.    Her family history includes Allergies in her other; Asthma in her father and other; Bone cancer in her other; Cancer in her mother; Hypertension in her father. There is no history of Melanoma.    She reports that she has never smoked. She has never used smokeless tobacco. She reports that she drinks about 1.2 oz of alcohol per week. She reports that she does not use illicit drugs.      Review of Systems   Constitutional: Negative for fever, chills and fatigue.   HENT: Negative for congestion and sinus pressure.    Respiratory: Negative for cough and shortness of breath.    Cardiovascular: Negative for chest pain, palpitations and leg swelling.   Gastrointestinal: Negative for nausea, vomiting, abdominal pain, diarrhea and constipation.   Genitourinary: Negative for difficulty urinating.   Musculoskeletal: Negative for back pain and joint swelling.   Skin: Negative for rash.   Neurological: Negative for dizziness, light-headedness and headaches.   Hematological: Does not bruise/bleed easily.   Psychiatric/Behavioral: Negative for confusion. The patient is not nervous/anxious.        Allergies:   Review of patient's allergies indicates no  known allergies.    Medications:     Outpatient Encounter Prescriptions as of 05/10/2016   Medication Sig Dispense Refill   ??? acetaminophen (TYLENOL) 325 MG tablet Take 650 mg by mouth every 6 hours as needed.     ??? albuterol (PROAIR HFA) 90 mcg/actuation inhaler Inhale 1-2 puffs into the lungs every 4 hours as needed for Wheezing. q4-6hrs PRN 2 Inhaler 12   ??? albuterol (PROVENTIL) 2.5 mg /3 mL (0.083 %) nebulizer solution Inhale 3 mLs (2.5 mg total) by  nebulization every 6 hours as needed for Wheezing. 90 vial 6   ??? cetirizine (ZYRTEC) 1 mg/mL syrup Take 10 mLs (10 mg total) by mouth daily. 118 mL 12   ??? dextroamphetamine-amphetamine (ADDERALL) 10 mg Tab Take 1 tablet (10 mg total) by mouth 3 times a day. 90 tablet 0   ??? dextroamphetamine-amphetamine (ADDERALL) 10 mg Tab Take 1 tablet (10 mg total) by mouth 3 times a day. 90 tablet 0   ??? escitalopram oxalate (LEXAPRO) 10 MG tablet Take 1 tablet (10 mg total) by mouth daily. 90 tablet 3   ??? fluticasone-salmeterol (ADVAIR DISKUS) 500-50 mcg/dose DsDv Inhale 1 puff into the lungs 2 times a day. 1 Inhaler 12   ??? melatonin 10 mg Cap Take by mouth.     ??? montelukast (SINGULAIR) 10 mg tablet Take 1 tablet (10 mg total) by mouth every morning. 30 tablet 12   ??? omeprazole (PRILOSEC) 40 MG capsule Take 1 capsule (40 mg total) by mouth every morning before breakfast. 30 capsule 12     No facility-administered encounter medications on file as of 05/10/2016.        Objective:       Blood pressure 145/92, pulse 93, height 5' 7.5 (1.715 m), weight 150 lb 8 oz (68.266 kg), last menstrual period 05/04/2016, SpO2 98 %.    Physical Exam   Nursing note and vitals reviewed.  Constitutional: She is oriented to person, place, and time. She appears well-developed and well-nourished.   HENT:   Head: Normocephalic and atraumatic.   Eyes: Conjunctivae are normal. Pupils are equal, round, and reactive to light.   Neck: Normal range of motion. Neck supple.   Cardiovascular: Normal rate and regular rhythm.    Pulmonary/Chest: Effort normal and breath sounds normal. Right breast exhibits no inverted nipple, no mass, no nipple discharge, no skin change and no tenderness. Left breast exhibits no inverted nipple, no mass, no nipple discharge, no skin change and no tenderness. Breasts are symmetrical.       Abdominal: Soft. She exhibits no distension.   Musculoskeletal: Normal range of motion. She exhibits no edema or tenderness.   Lymphadenopathy:      She has no cervical adenopathy.     She has no axillary adenopathy.        Right: No supraclavicular adenopathy present.        Left: No supraclavicular adenopathy present.   Neurological: She is alert and oriented to person, place, and time.   Skin: Skin is warm and dry.   Psychiatric: She has a normal mood and affect. Her behavior is normal. Judgment and thought content normal.       Review of Lab Results:      Lab Results   Component Value Date    WBC 12.6* 09/12/2015    HGB 13.0 09/12/2015    HCT 40.0 09/12/2015    PLT 248 09/12/2015    GLUCOSE 100 09/12/2015    CREATININE 0.66 09/12/2015  NA 134 09/12/2015    K 3.5 09/12/2015    CL 101 09/12/2015    CO2 29 09/12/2015    BILITOT 1.1 09/12/2015    PROT 7.5 09/12/2015    AST 12* 09/12/2015    ALT 9 09/12/2015    ALKPHOS 55 09/12/2015       Imaging:       Pathology:       Cancer Staging:   No matching staging information was found for the patient.       Assessment:   45 y.o. 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 Sorrels and her friend that atypia alone is a marker of increased risk. However, presence on a core needle biopsy is associated with a 10-15% incidence of adjacent ductal carcinoma in situ or invasive cancer. Therefore, we often recommended needle localized excisional biopsy to evaluate the surrounding tissue. We reviewed how this is performed as well as the risks (namely bleeding, infection, scarring, need for further surgery) and benefits of the operation. She does understand that if DCIS or invasive cancer are identified she may need further surgery or other therapies.    I also recommended needle localized excisional biopsy of the second area of calcifications due to similar appearance.    We discussed and she understands that there is a risk of  implant rupture. If this did occur she would need to return to the OR on a later date to undergo revision. She will try to find the information about her implants.    We discussed the mass along her chest wall which I agree is likely a lipoma. Recommended that we monitor for now. Consider excision if it grows or otherwise becomes problematic, however, could consider excision now if she feels that it is troublesome to her.    Questions/concerns were addressed and she is agreeable to proceeding with surgery. Consent was obtained. Will proceed with surgery scheduling.    I have reviewed and agree with her review of systems, past medical/surgical history/family history, social history, allergies, and medications as documented.

## 2016-05-21 NOTE — Unmapped (Signed)
Informed patient that unfortunately, her surgery would need to be rescheduled from 05/26/16. Patient's surgery re-scheduled to 06/02/16 at [facility].

## 2016-05-24 ENCOUNTER — Telehealth: Payer: PRIVATE HEALTH INSURANCE

## 2016-05-24 NOTE — Unmapped (Addendum)
Pre-Procedure Instructions  We???re pleased that you have chosen [facility] for your upcoming procedure.  The staff serving you is professionally trained to provide the highest quality care.  We encourage you to ask questions and to let the staff know your special needs.  We want your visit to be as comfortable as possible.    Your surgery is scheduled on August 30th.  Please arrive at  9:30 AM and check in at the surgery waiting room on the second floor of the main hospital.    Directions to Surgery Area-  stop at [employer] on Main Floor    STOP ASPIRIN, NSAIDS (non-steroidal anti-inflammatories such as Ibuprofen, Advil and Naproxen), SUPPLEMENTS, FISH OIL, VITAMINS, AND HERBAL SUPPLEMENTS ONE WEEK PRIOR TO SURGERY.  ACETAMINOPHEN (TYLENOL) IS OK TO TAKE THE WEEK BEFORE SURGERY.      ?? DO NOT EAT OR DRINK ANYTHING (including gum, mints, water, etc.) after midnight the night before your procedure.  You may brush your teeth and gargle on the morning of surgery, but do not swallow any water, except for a small sip, with the following medication:  Prilosec.    ??? Please make transportation arrangements and bring a responsible adult to accompany you home and remain with you for 24 hours.    ??? We recommend that you leave valuables (i.e. money, jewelry, credit cards) at home.  If you wear glasses or contacts, bring a case for safekeeping.    ??? Wear casual, loose fitting, and comfortable clothing.  A gown will be provided.  If you are staying overnight, bring a small overnight bag.  (Storage space is limited.)    ??? Please remove all makeup, nail polish, jewelry, body piercings, powder, lotions, and perfume/cologne before you arrive.    ??? Bring a list of your medications and dose including herbal.  Do not bring any pills or medications to the hospital. (Exception: transplant patients.)    ??? Bring a photo ID and your insurance card so we can bill your insurance company directly.    ??? Please do not bring any children  under the age of 74 to the hospital.    ??? Do not shave in the area of the surgery for 2 days prior to surgery.  If needed, a trained staff member will clip the area immediately before your surgery.    ??? Quit smoking as far in advance of surgery as possible.  Patients who quit at least 30 days before surgery may have better outcomes.    ??? If you are diabetic, pay close attention to your blood sugar and try to keep it in the range your doctor wants it to be in.      ??? Talk to your doctor about taking medication such as Aspirin, Plavix, Pradaxa or Coumadin before surgery.    ??? If you have a cold or are sick prior to surgery, contact your surgeon before surgery.    ??? Please shower at home the evening before and the morning of surgery using an antibacterial soap, such as Dial or Safeguard.      Special Instructions    1. Please bring Rescue Inhaler the day of surgery.    Antibacterial showering and good hand hygiene are essential to prevent surgical site infections and reduce the spread of MRSA.  Please take a shower the morning of surgery using an antibacterial soap.  Patient verbalized understanding of these instructions.    Make sure all of your health care givers  are checking your ID bracelet and verifying your name and date of birth.  You will actively be involved in verifying the type of surgery you are having and the correct site.  Your health care givers should be cleaning their hands with soap and water or antibacterial foam before taking care of you and if they do not it is ok to remind them to do so.      In an effort to reduce the risks of blood clots after your surgery you will have compression sleeves on your lower legs.  These sleeves help facilitate circulation and decrease the chances of developing any blood clots.     You will be given an incentive spirometer after surgery to use every hour to help prevent pneumonia by having you take deep breaths in and out.  You will be given instructions about proper  use after surgery.         Patient/Family provided education about surgical site infection prevention.    Contact information:    [facility] Pre-admission Testing,  Monday - Friday 8:00 am - 4:30 pm,   [phone].    If you need to reach someone outside of regular business hours regarding your surgery please call your surgeon or   [facility] at [phone].

## 2016-05-26 ENCOUNTER — Ambulatory Visit: Payer: PRIVATE HEALTH INSURANCE

## 2016-05-31 ENCOUNTER — Ambulatory Visit: Payer: PRIVATE HEALTH INSURANCE

## 2016-06-01 NOTE — Unmapped (Signed)
Patient left message on 8/25 that she would like to have the lipoma removed.     Left message for patient with new arrival time for surgery:  1215pm on 06/02/16.

## 2016-06-02 ENCOUNTER — Ambulatory Visit: Admit: 2016-06-02 | Payer: PRIVATE HEALTH INSURANCE

## 2016-06-02 ENCOUNTER — Ambulatory Visit: Payer: PRIVATE HEALTH INSURANCE

## 2016-06-02 ENCOUNTER — Inpatient Hospital Stay: Admit: 2016-06-02 | Payer: PRIVATE HEALTH INSURANCE

## 2016-06-02 DIAGNOSIS — R921 Mammographic calcification found on diagnostic imaging of breast: Secondary | ICD-10-CM

## 2016-06-02 LAB — POCT URINE HCG BY VISUAL COLOR COMPARISON TESTS: Preg Test, POC, Ur: NEGATIVE

## 2016-06-02 MED ORDER — fentaNYL (SUBLIMAZE) injection 25 mcg
50 | INTRAMUSCULAR | Status: AC | PRN
Start: 2016-06-02 — End: 2016-06-02

## 2016-06-02 MED ORDER — HYDROmorphone (DILAUDID) injection Syrg 0.4 mg
0.5 | INTRAMUSCULAR | Status: AC | PRN
Start: 2016-06-02 — End: 2016-06-02

## 2016-06-02 MED ORDER — HYDROmorphone (DILAUDID) injection Syrg 0.2 mg
0.5 | INTRAMUSCULAR | Status: AC | PRN
Start: 2016-06-02 — End: 2016-06-02

## 2016-06-02 MED ORDER — gentamicin (GARAMYCIN) 40 mg/mL injection
40 | INTRAMUSCULAR | Status: AC
Start: 2016-06-02 — End: 2016-06-02

## 2016-06-02 MED ORDER — midazolam (PF) (VERSED) injection
1 | INTRAMUSCULAR | Status: AC | PRN
Start: 2016-06-02 — End: 2016-06-02
  Administered 2016-06-02 (×2): 1 via INTRAVENOUS

## 2016-06-02 MED ORDER — ceFAZolin (ANCEF) 1 g in sodium chloride 0.9% 100 mL ADDaptor IVPB
1 | INTRAMUSCULAR | Status: AC | PRN
Start: 2016-06-02 — End: 2016-06-02
  Administered 2016-06-02: 20:00:00 1 g via INTRAVENOUS

## 2016-06-02 MED ORDER — fentaNYL (SUBLIMAZE) injection 12.5 mcg
50 | INTRAMUSCULAR | Status: AC | PRN
Start: 2016-06-02 — End: 2016-06-02

## 2016-06-02 MED ORDER — fentaNYL (SUBLIMAZE) injection 50 mcg
50 | INTRAMUSCULAR | Status: AC | PRN
Start: 2016-06-02 — End: 2016-06-02

## 2016-06-02 MED ORDER — midazolam (PF) (VERSED) 1 mg/mL injection
1 | INTRAMUSCULAR | Status: AC
Start: 2016-06-02 — End: ?

## 2016-06-02 MED ORDER — HYDROmorphone (DILAUDID) injection Syrg 0.6 mg
1 | INTRAMUSCULAR | Status: AC | PRN
Start: 2016-06-02 — End: 2016-06-02

## 2016-06-02 MED ORDER — silver nitrate applicators 75-25 %
75-25 | TOPICAL | Status: AC
Start: 2016-06-02 — End: 2016-06-02

## 2016-06-02 MED ORDER — lidocaine (PF) (XYLOCAINE) 10 mg/mL (1 %) Soln
10 | INTRAMUSCULAR | Status: AC | PRN
Start: 2016-06-02 — End: 2016-06-02
  Administered 2016-06-02: 20:00:00 15

## 2016-06-02 MED ORDER — HYDROcodone-acetaminophen (NORCO) 5-325 mg per tablet
5-325 | ORAL_TABLET | Freq: Four times a day (QID) | ORAL | 0.00 refills | 15.50000 days | Status: AC | PRN
Start: 2016-06-02 — End: 2016-06-14

## 2016-06-02 MED ORDER — bupivacaine (PF) (SENSORCAINE/MARCAINE) 0.5 % (5 mg/mL) Soln
0.5 | INTRAMUSCULAR | Status: AC
Start: 2016-06-02 — End: 2016-06-02

## 2016-06-02 MED ORDER — fentaNYL (SUBLIMAZE) 50 mcg/mL injection
50 | INTRAMUSCULAR | Status: AC
Start: 2016-06-02 — End: ?

## 2016-06-02 MED ORDER — ondansetron (ZOFRAN) 4 mg/2 mL injection
4 | INTRAMUSCULAR | Status: AC
Start: 2016-06-02 — End: ?

## 2016-06-02 MED ORDER — HYDROmorphone (DILAUDID) injection Syrg 0.1 mg
0.5 | INTRAMUSCULAR | Status: AC | PRN
Start: 2016-06-02 — End: 2016-06-02

## 2016-06-02 MED ORDER — lidocaine (PF) (XYLOCAINE) 10 mg/mL (1 %) Soln
10 | INTRAMUSCULAR | Status: AC
Start: 2016-06-02 — End: 2016-06-02
  Administered 2016-06-02: 18:00:00

## 2016-06-02 MED ORDER — ondansetron (ZOFRAN) 4 mg/2 mL injection 4 mg
4 | Freq: Three times a day (TID) | INTRAMUSCULAR | Status: AC | PRN
Start: 2016-06-02 — End: 2016-06-02

## 2016-06-02 MED ORDER — ketorolac (TORADOL) 30 mg/mL (1 mL) injection
30 | INTRAMUSCULAR | Status: AC
Start: 2016-06-02 — End: ?

## 2016-06-02 MED ORDER — propofol (DIPRIVAN) infusion 10 mg/mL
10 | INTRAVENOUS | Status: AC | PRN
Start: 2016-06-02 — End: 2016-06-02
  Administered 2016-06-02: 20:00:00 120 via INTRAVENOUS

## 2016-06-02 MED ORDER — lactated Ringers infusion
INTRAVENOUS | Status: AC
Start: 2016-06-02 — End: 2016-06-02
  Administered 2016-06-02: 19:00:00 50 mL/h via INTRAVENOUS
  Administered 2016-06-02: 20:00:00 via INTRAVENOUS

## 2016-06-02 MED ORDER — fentaNYL (SUBLIMAZE) injection 6.5 mcg
50 | INTRAMUSCULAR | Status: AC | PRN
Start: 2016-06-02 — End: 2016-06-02

## 2016-06-02 MED ORDER — fentaNYL (SUBLIMAZE) injection
50 | INTRAMUSCULAR | Status: AC | PRN
Start: 2016-06-02 — End: 2016-06-02
  Administered 2016-06-02 (×6): 25 via INTRAVENOUS

## 2016-06-02 MED ORDER — dexamethasone (DECADRON) injection
4 | INTRAMUSCULAR | Status: AC | PRN
Start: 2016-06-02 — End: 2016-06-02
  Administered 2016-06-02: 21:00:00 8 via INTRAVENOUS

## 2016-06-02 MED ORDER — ondansetron (ZOFRAN) 4 mg/2 mL injection
4 | INTRAMUSCULAR | Status: AC | PRN
Start: 2016-06-02 — End: 2016-06-02
  Administered 2016-06-02: 21:00:00 4 via INTRAVENOUS

## 2016-06-02 MED ORDER — lactated Ringers infusion
INTRAVENOUS | Status: AC
Start: 2016-06-02 — End: 2016-06-02

## 2016-06-02 MED ORDER — lidocaine (PF) (XYLOCAINE) 10 mg/mL (1 %) Soln
10 | INTRAMUSCULAR | Status: AC
Start: 2016-06-02 — End: 2016-06-02

## 2016-06-02 MED ORDER — EPINEPHrine 1 mg/mL (1 mL) injection Soln
1 | INTRAMUSCULAR | Status: AC
Start: 2016-06-02 — End: 2016-06-02

## 2016-06-02 MED ORDER — bupivacaine (MARCAINE) 0.5 % (5 mg/mL) injection
0.5 | INTRAMUSCULAR | Status: AC | PRN
Start: 2016-06-02 — End: 2016-06-02
  Administered 2016-06-02: 21:00:00 15 via SUBCUTANEOUS

## 2016-06-02 MED ORDER — ketorolac (TORADOL) injection 30 mg
30 | INTRAMUSCULAR | Status: AC | PRN
Start: 2016-06-02 — End: 2016-06-02
  Administered 2016-06-02: 21:00:00 30 via INTRAVENOUS

## 2016-06-02 MED ORDER — lidocaine (PF) 20 mg/mL (2 %) Soln
20 | INTRAVENOUS | Status: AC | PRN
Start: 2016-06-02 — End: 2016-06-02
  Administered 2016-06-02: 20:00:00 50 via INTRAVENOUS

## 2016-06-02 MED ORDER — naloxone (NARCAN) injection 0.04 mg
0.4 | INTRAMUSCULAR | Status: AC | PRN
Start: 2016-06-02 — End: 2016-06-02

## 2016-06-02 MED ORDER — propofol 10 mg/ml (DIPRIVAN) injection
10 | INTRAVENOUS | Status: AC | PRN
Start: 2016-06-02 — End: 2016-06-02
  Administered 2016-06-02: 20:00:00 20 via INTRAVENOUS
  Administered 2016-06-02: 21:00:00 50 via INTRAVENOUS
  Administered 2016-06-02: 20:00:00 20 via INTRAVENOUS
  Administered 2016-06-02: 20:00:00 30 via INTRAVENOUS
  Administered 2016-06-02 (×2): 50 via INTRAVENOUS

## 2016-06-02 MED ORDER — lidocaine (PF) 2% (20 mg/mL) Soln 20 mg
20 | Freq: Once | INTRAMUSCULAR | Status: AC | PRN
Start: 2016-06-02 — End: 2016-06-02

## 2016-06-02 MED ORDER — propofol (DIPRIVAN) infusion 10 mg/mL ADS Med
10 | INTRAVENOUS | Status: AC
Start: 2016-06-02 — End: ?

## 2016-06-02 MED ORDER — gentamicin 80 mg in sodium chloride, irrigation 0.9 % 1,000 mL IRRIGATION
0.9 | Status: AC | PRN
Start: 2016-06-02 — End: 2016-06-02
  Administered 2016-06-02: 20:00:00 80 mg

## 2016-06-02 MED FILL — XYLOCAINE-MPF 10 MG/ML (1 %) INJECTION SOLUTION: 10 10 mg/mL (1 %) | INTRAMUSCULAR | Qty: 30

## 2016-06-02 MED FILL — FENTANYL (PF) 50 MCG/ML INJECTION SOLUTION: 50 50 mcg/mL | INTRAMUSCULAR | Qty: 2

## 2016-06-02 MED FILL — PROPOFOL INFUSION 10 MG/ML: 10 10 mg/mL | INTRAVENOUS | Qty: 100

## 2016-06-02 MED FILL — CEFAZOLIN 1 GRAM SOLUTION FOR INJECTION: 1 1 g | INTRAMUSCULAR | Qty: 1000

## 2016-06-02 MED FILL — SILVER NITRATE APPLICATORS 75 %-25 % TOPICAL STICK: 75-25 75-25 % | TOPICAL | Qty: 1

## 2016-06-02 MED FILL — BUPIVACAINE (PF) 0.5 % (5 MG/ML) INJECTION SOLUTION: 0.5 0.5 % (5 mg/mL) | INTRAMUSCULAR | Qty: 30

## 2016-06-02 MED FILL — EPINEPHRINE 1 MG/ML (1 ML) INJECTION SOLUTION: 1 1 mg/mL (1 mL) | INTRAMUSCULAR | Qty: 1

## 2016-06-02 MED FILL — GENTAMICIN 40 MG/ML INJECTION SOLUTION: 40 40 mg/mL | INTRAMUSCULAR | Qty: 2

## 2016-06-02 MED FILL — LACTATED RINGERS INTRAVENOUS SOLUTION: 50.00 50.00 mL/hr | INTRAVENOUS | Qty: 1000

## 2016-06-02 MED FILL — KETOROLAC 30 MG/ML (1 ML) INJECTION SOLUTION: 30 30 mg/mL (1 mL) | INTRAMUSCULAR | Qty: 1

## 2016-06-02 MED FILL — MIDAZOLAM (PF) 1 MG/ML INJECTION SOLUTION: 1 1 mg/mL | INTRAMUSCULAR | Qty: 2

## 2016-06-02 MED FILL — ONDANSETRON HCL (PF) 4 MG/2 ML INJECTION SOLUTION: 4 4 mg/2 mL | INTRAMUSCULAR | Qty: 2

## 2016-06-02 NOTE — Unmapped (Signed)
Brief Procedure Note    KAROLEE MELONI  06/02/2016      Pre-op Diagnosis: Abnormal uterine bleeding, endometrial polyp       Post-op Diagnosis: same    Procedure(s):   HYSTEROSCOPIC RESECTION OF ENDOMETRIAL POLYP      Surgeon(s):  Raylene Everts    Anesthesia: MAC (Monitor Anesthesia Care)    Staff:   Circulator: Sim Boast, RN  Relief Circulator: Wilfrid Lund, RN  Scrub Person: Noralee Stain  Assistant: Sherrine Maples, CSA; Elton Sin, CSA  Resident: Lorenz Coaster, MD    Estimated Blood Loss: Minimal                 Specimens:   Specimens     ID Description Commments Type Source Tests Collected By Collected At    C ENDOMETRIAL POLYP C Tissue Endometrium - SURGICAL PATHOLOGY Waynard Edwards 06/02/16 1715                 Drains:             There were no complications unless listed below.        Raylene Everts     Date: 06/02/2016  Time: 5:34 PM

## 2016-06-02 NOTE — Unmapped (Signed)
INTRA-OP POST BRIEFING NOTE: Mary [name]      Specimens:   Specimens     ID Source Type Tests Collected By Collected At Frozen? Attributes Order ID Breast Spec Formalin Marked as Sent    A Breast Right Tissue - SURGICAL PATHOLOGY EXAM Cristal Generous, MD 06/02/16 1619  Fresh       Comment: A.  Right breast lateral specimen.  Short stitch superior, long stitch lateral    B Breast Right Tissue - SURGICAL PATHOLOGY EXAM Cristal Generous, MD 06/02/16 1624  Fresh       Comment: B.  Right breast medial specimen.  Short stitch superior, long stitch lateral.    C Endometrium Tissue - SURGICAL PATHOLOGY EXAM Raylene Everts 06/02/16 1715  Sent in Formalin       Comment: C          Prior to leaving the room: Nurse confirmed name of procedure, completion of instrument, sponge & needle counts, reads specimen labels aloud including patient name and addresses any equipment issues? Nurse confirmed wound class. Nurse to surgeon and anesthesia: What are key concerns for recovery and management of the patient?  Yes      Blood products stored at appropriate temperatures prior to return to blood bank (if applicable)? N/A      Patient identification band secured on patient prior to transfer out of the operating room? Yes      Other Comments:     Signed: Francoise Schaumann Rolande Moe    Date: 06/02/2016    Time: 5:50 PM

## 2016-06-02 NOTE — Unmapped (Signed)
Anesthesia Transfer of Care Note    Patient: Mary [name]  Procedure(s) Performed: Procedure(s):  RIGHT BREAST NEEDLE LOCALIZED EXCISIONAL BIOPSY OF 2 AREAS,     HYSTEROSCOPIC RESECTION OF ENDOMETRIAL POLYP    Patient location: Same Day Surgery    Anesthesia type: MAC    Airway Device on Arrival to PACU/ICU: Room Air    IV Access: Peripheral    Monitors Recommended to be Used During PACU/ICU: Standard Monitors    Outstanding Issues to Address: None    Level of Consciousness: awake, alert  and oriented    Post vital signs:    Filed Vitals:    06/02/16 1748   BP: 130/92   Pulse: 94   Temp: 97 ??F (36.1 ??C)   Resp: 19   SpO2: 94%       Complications: None      Date 06/01/16 1500 - 06/02/16 0659(Not Admitted) 06/02/16 0700 - 06/03/16 0659   Shift 1500-2259 2300-0659 24 Hour Total 0700-1459 1500-2259 2300-0659 24 Hour Total   I  N  T  A  K  E   I.V.  (mL/kg)     700  (10.6)  700  (10.6)      Volume (mL) (lactated Ringers infusion)     700  700    Shift Total  (mL/kg)     700  (10.6)  700  (10.6)   O  U  T  P  U  T   Blood     25  25      Est Blood Loss     25  25    Shift Total  (mL/kg)     25  (0.4)  25  (0.4)   Weight (kg) 68 68 68 65.8 65.8 65.8 65.8

## 2016-06-02 NOTE — Unmapped (Signed)
[place]  DEPARTMENT OF ANESTHESIOLOGY  PRE-PROCEDURAL EVALUATION    Mary [name] is a 45 y.o. year old female presenting for:    Procedure(s):  RIGHT BREAST NEEDLE LOCALIZED EXCISIONAL BIOPSY OF 2 AREAS, POSSIBLE EXCISION OF RIGHT BREAST LIPOMA    HYSTEROSCOPIC RESECTION OF ENDOMETRIAL POLYP    Surgeon:   Cristal Generous, MD    Chief Complaint     Swelling, mass, or lump in chest [R22.2]; Calcification of right breast [R*    Review of Systems     Anesthesia Evaluation    Patient summary reviewed and nursing notes reviewed.       I have reviewed the History and Physical Exam, any relevant changes are noted in the anesthesia pre-operative evaluation.      Cardiovascular:    Exercise tolerance: good  Duke Met score: 5 - Walking four miles per hour. Social dancing. Washing a car.    Neuro/Muscoloskeletal/Psych:    (+) psychiatric history (ADD), anxiety and depression.      Pulmonary:    (+) asthma (controlled on inhalers, no ER visits nor hospitalizations).    ROS comment: PFTs 10/16  Obstructive defect is present.  Spirometry shows moderately severe obstructive defect.  There is a significant response to bronchodilator demonstrated.  Air trapping is present.  Diffusing capacity is elevated and may suggest asthma      GI/Hepatic/Renal:        Comments: Diverticulitis    Endo/Other:    (+) anemia (Fe deficiency).        Comments: Calcification Rt breast  Endometrial polyp       Past Medical History     Past Medical History   Diagnosis Date   ??? Environmental allergies    ??? Asthma    ??? Chronic back pain    ??? Cervical dysplasia    ??? Anxiety    ??? Depression    ??? ADD (attention deficit disorder)    ??? Varicella    ??? Diverticulitis    ??? Cervical cancer 19   ??? Vitamin D deficiency    ??? Iron deficiency        Past Surgical History     Past Surgical History   Procedure Laterality Date   ??? Cervical cone biopsy     ??? Arthroscopy knee menisectomy, meniscal repair Right 2013     right   ??? 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 Augmentation mammaplasty  age 50-24     [place], [place]   ??? Uterine biopsy        x2       Family History     Family History   Problem Relation Age of Onset   ??? Uterine cancer Mother 25     uterine vs cervical    ??? Hypertension Father    ??? Asthma Father    ??? Asthma Other    ??? Allergies Other    ??? Melanoma Neg Hx    ??? Breast cancer Neg Hx    ??? Ovarian cancer Neg Hx    ??? Colon cancer Neg Hx    ??? Pancreatic cancer Neg Hx    ??? Prostate cancer Neg Hx    ??? Bone cancer Paternal Grandmother 68       Social History     Social History     Social History   ??? Marital Status: Married  Spouse Name: N/A   ??? Number of Children: N/A   ??? Years of Education: N/A     Occupational History   ??? Not on file.     Social History Main Topics   ??? Smoking status: Never Smoker    ??? Smokeless tobacco: Never Used      Comment: 02/24/2011; passive smoke exposure:no (06/18/2005)   ??? Alcohol Use: 1.2 oz/week     2 Glasses of wine per week      Comment: socially   ??? Drug Use: No      Comment: 01/07/2011   ??? Sexual Activity: Not on file     Other Topics Concern   ??? Caffeine Use No   ??? Occupational Exposure No   ??? Exercise Yes     maybe 2 days a week   ??? Seat Belt Yes     Social History Narrative    Currently not working. Lives in a newer home with husband and 2 kids, no known mold in the home, no pets at home, no occupational exposures.        Medications     Allergies:  No Known Allergies    Home Meds:  Prior to Admission medications as of 06/02/16 1244   Medication Sig Taking?   acetaminophen (TYLENOL) 325 MG tablet Take 650 mg by mouth every 6 hours as needed. Yes   albuterol (PROAIR HFA) 90 mcg/actuation inhaler Inhale 1-2 puffs into the lungs every 4 hours as needed for Wheezing. q4-6hrs PRN Yes   cetirizine (ZYRTEC) 1 mg/mL syrup Take 10 mLs (10 mg total) by mouth daily. Yes   dextroamphetamine-amphetamine (ADDERALL) 10 mg Tab Take 1 tablet (10 mg total) by mouth 3 times a day. Yes    dextroamphetamine-amphetamine (ADDERALL) 10 mg Tab Take 1 tablet (10 mg total) by mouth 3 times a day. Yes   escitalopram oxalate (LEXAPRO) 10 MG tablet Take 1 tablet (10 mg total) by mouth daily. Yes   fluticasone-salmeterol (ADVAIR DISKUS) 500-50 mcg/dose DsDv Inhale 1 puff into the lungs 2 times a day. Yes   melatonin 10 mg Cap Take by mouth. Yes   omeprazole (PRILOSEC) 40 MG capsule Take 1 capsule (40 mg total) by mouth every morning before breakfast. Yes       Inpatient Meds:  Scheduled:   ??? bupivacaine (PF)       ??? gentamicin       ??? lidocaine (PF)         Continuous:   ??? lactated Ringers     ??? lactated Ringers         PRN: ceFAZolin (ANCEF) IVPB, fentaNYL **OR** fentaNYL **OR** fentaNYL **OR** fentaNYL **OR** fentaNYL, HYDROmorphone **OR** HYDROmorphone **OR** HYDROmorphone **OR** HYDROmorphone **OR** HYDROmorphone, lidocaine (PF) 2% (20 mg/mL), naloxone, ondansetron    Vital Signs     Wt Readings from Last 3 Encounters:   06/02/16 145 lb (65.772 kg)   05/10/16 150 lb 8 oz (68.266 kg)   02/27/16 147 lb (66.679 kg)     Ht Readings from Last 3 Encounters:   06/02/16 5' 6 (1.676 m)   05/10/16 5' 7.5 (1.715 m)   12/24/15 5' 6 (1.676 m)     Temp Readings from Last 3 Encounters:   06/02/16 98.1 ??F (36.7 ??C) Oral   02/27/16 98.1 ??F (36.7 ??C) Oral   11/13/15 98.1 ??F (36.7 ??C) Oral     BP Readings from Last 3 Encounters:   06/02/16 117/74   05/10/16 145/92   02/27/16  114/88     Pulse Readings from Last 3 Encounters:   06/02/16 64   05/10/16 93   02/27/16 89     SpO2 Readings from Last 3 Encounters:   06/02/16 98%   05/10/16 98%   02/27/16 98%       Physical Exam     Airway:     Mallampati: I  Mouth Opening: >2 FB  TM distance: > = 3 FB  Neck ROM: full    Dental:   - No obvious cracked, loose, chipped, or missing teeth.     Pulmonary:   - normal exam     Cardiovascular:  - normal exam     Neuro/Musculoskeletal/Psych:      Abdominal:       Current OB Status:       Other Findings:        Laboratory Data     Lab  Results   Component Value Date    WBC 12.6* 09/12/2015    HGB 13.0 09/12/2015    HCT 40.0 09/12/2015    MCV 86.4 09/12/2015    PLT 248 09/12/2015       No results found for: [facility]    Lab Results   Component Value Date    GLUCOSE 100 09/12/2015    BUN 8 09/12/2015    CO2 29 09/12/2015    CREATININE 0.66 09/12/2015    K 3.5 09/12/2015    NA 134 09/12/2015    CL 101 09/12/2015    CALCIUM 9.3 09/12/2015    ALBUMIN 3.9 09/12/2015    PROT 7.5 09/12/2015    ALKPHOS 55 09/12/2015    ALT 9 09/12/2015    AST 12* 09/12/2015    BILITOT 1.1 09/12/2015       No results found for: PTT, INR    Lab Results   Component Value Date    PREGTESTUR Negative 06/02/2016       Anesthesia Plan     ASA 2           Anesthesia Type:  MAC.         Intravenous induction.    Anesthetic plan and risks discussed with patient.    Plan, alternatives, and risks of anesthesia, including death, have been explained to and discussed with the patient/legal guardian.  By my assessment, the patient/legal guardian understands and agrees.  Scenario presented in detail.  Questions answered.      Plan discussed with CRNA.

## 2016-06-02 NOTE — Unmapped (Addendum)
1750- arrived from OR awake and alert. Surgical incision to right breast/nipple area open to air with dermabond closure. No bleeding or drainage seen. Pt denies pain. Small amt of vag bleeding on pad. Family to bedside. Pt tolerating soda and crackers. Call light in reach. Pt instructed to call for questions or assistance. Side rails up for safety. Will continue to monitor.   1840- pt asst to restroom with steady gait. Voiding without difficulty.   1850-   Pt. Discharged per wheelchair to exit.    Dressing: dermabond    Discharge Pain level- 0/10  Patient states pain is tolerable.    Patient and family verbalized understanding of discharge instructions.      Filed Vitals:    06/02/16 1845   BP:    Pulse: 75   Temp:    Resp: 19   SpO2: 97%             I/O this shift:  In: 1040 [P.O.:240; I.V.:800]  Out: 25 [Blood:25]      [name] A Sullivan Jacuinde

## 2016-06-02 NOTE — Unmapped (Signed)
517 ML FLUID DEFICIT FOR PROCEDURE. CONFIRMED BY DR. FRANZESE.

## 2016-06-02 NOTE — Unmapped (Signed)
Anesthesia Post Note    Patient: Mary [name]    Procedure(s) Performed: Procedure(s):  RIGHT BREAST NEEDLE LOCALIZED EXCISIONAL BIOPSY OF 2 AREAS,     HYSTEROSCOPIC RESECTION OF ENDOMETRIAL POLYP    Anesthesia type: MAC    Patient location: Same Day Surgery    Post pain:     Post assessment: no apparent anesthetic complications and voided    Last Vitals:   Filed Vitals:    06/02/16 1800 06/02/16 1815 06/02/16 1830 06/02/16 1845   BP: 109/71 96/56 95/61     Pulse: 75 74 72 75   Temp:       TempSrc:       Resp: 17 11 18 19    Height:       Weight:       SpO2: 98% 97% 95% 97%        Post vital signs: stable    Level of consciousness:     Complications: None     Patient discharged from [facility] prior to my evaluation.   Patient did well from chart review, voided alert and oriented with pain controlled prior to discharge

## 2016-06-02 NOTE — Unmapped (Signed)
GYNECOLOGY OPERATIVE NOTE    Date of procedure: June 02, 2016   Pre-operative Diagnosis: abnormal uterine bleeding, endometrial polyp  Post-operative Diagnosis: same  Procedure: hysteroscopic resection of endometrial polyp  Anesthesia: MAC  Surgeon: Dr. Darcella Gasman  Assistant: Dr. Shawnee Knapp, PGY1  Findings: posterior wall polyp  Complications: none  Specimens: EMC and polyp  UOP: clear   EBL: minimal   Hysteroscopic Fluid Deficit:     Indications:  The patient is a 45 y.o. female who was seen in the office for the above problem and recommended to undergo the above procedure.   The procedure was coordinated with Dr. Melvyn Neth to minimize risks of anesthesia.  The risks, benefits, and alternatives including but not limited to bleeding, infection, injury to pelvic organs, and uterine perforation were discussed with the patient.  All questions were answered satisfactorily and informed consent was obtained.      Procedure Details   The patient was previously taken to the operating room per Dr. Melvyn Neth, see dictation.  At the end of her procedure, the patient was transferred from the supine position to the dortholithotomy position in Jennings stirrups, prepped and draped in the usual sterile fashion.  A Time Out was completed and the above information again confirmed.  A  catheter was inserted into her bladder. Two retractors were placed into the vagina. An Allis clamp was used to grasp the anterior lip of the cervix.  The cervical canal was then dilated and the hysteroscope was inserted into the vagina with the above findings noted.  The Myosure  device was then inserted into the uterus.  The myosure device was used to completely excise the above noted pathology until the endometrium and uterine cavity were normal.  The Myosure and hysteroscope were removed from the uterus.  A superficial mucosal tear was noted near the urethral meatus, hemostasis was not achieved with manual pressure and a figure of eight was placed with 4-0  vicryl away from the urethral meatus.  There was minimal bleeding noted and the Allis clamp was removed with good hemostasis noted.  Instrument, sharp, and sponge counts were correct.    The patient tolerated the procedure well. The patient was taken to the recovery area in stable condition.      Raylene Everts  06/02/2016

## 2016-06-02 NOTE — Unmapped (Addendum)
Breast Surgery Discharge Instructions  General Information:  ??? You will have a clear glue dressing (Dermabond) or steri-strips covered with gauze and adhesive dressing/tape on your incision. A small amount of blood may be present - this is normal.  ??? Your final pathology will not be available for 5-7 business days (occasionally longer). If you have a MyChart account these results may be released to your MyChart account, otherwise, this will be discussed with you at your first post-operative visit.    Care of your incisions:  ??? You may shower (do not scrub at incisions, okay to allow water to run over them):  ? 24 hours after surgery  ??? Once you have showered, pat incisions dry and leave steri-strips/glue in place.  ??? No submerging in bath or swimming pool for 2 weeks or a hot tub/river/lake/ocean for 6 weeks.  ??? Do not use anti-perspirant under your arm for at least 2 weeks if you have an incision here. Baby powder and deodorant are okay.  ??? If you had a lumpectomy or biopsy, wear a soft supportive bra (i.e. sports bra or similar) 24 hours a day for 10-14 days to minimize discomfort, bleeding, and bruising.  ??? You can expect your breast to be sore and swollen for 2-3 weeks after surgery. It is normal to develop fluid or thickening inside the breast.  ??? Ice packs may be applied to the incisions for 20 minutes per hour for 48 hours while awake to help with pain and swelling.    Medications:  ??? Take your pain medication (Nocro) 1 tablet every 6 hours as needed or as directed. Take with food to minimize nausea and vomiting.   ??? You may also take the additional medications if approved by your surgeon/other physicians:  ? Acetaminophen 500 mg every 6 hours as needed for pain (do not take WITH another pain medication that contains acetaminophen).  ? Ibuprofen 600 mg four times a day with food.    Activities:  ??? You may resume light activities and a regular diet.   ??? Arm range of motion exercises are encouraged.  ??? Do not  drive   ? Within 24 hours of surgery  ? While taking narcotic pain medicine.  ??? You may return to work in approximately:  ? 1-2 days.    When to call the nurse/surgeon:  ??? If your breast becomes increasingly red, sore, or warm to the touch.  ??? If you develop a fever greater than 101.5.  ??? If you experience chills, nausea, and/or vomiting.  ??? If your breast becomes very swollen or hard with bruising.  ??? If your incision starts to drain fluid or blood.    If you need medical assistance:  ??? During business hours (8am-5pm), call [phone] and ask for your nurse.  ??? After business hours, call [phone] to be connected to the answering service and the on call physician.    Office locations and follow up:  ?? [facility]; [address] [place], [place] [address] or   ?? [facility] [place]; [address]; 4th floor [place] [address]  ?? Please call [phone] if you need to reschedule.        -------------------------------------------------------------------------------------------------------------------------  Discharge instructions for Dr Darcella Gasman    Follow up in the office in 2 weeks for postoperative discussion and pathology report.      MEDICATIONS:    ??? You may also use Tylenol or ibuprofen for mild pain or if  the narcotic medications make you nauseated, dizzy, or lightheaded.  ??? Anesthesia and pain medications can lead to constipation. You may use over the counter medications such as Miralax, Colace, Metamucil, etc for constipation.     EXPECTATIONS:      ??? A sore throat (from the breathing tube) are normal during the first few postoperative days and will resolve with time without treatment.   ??? Spotting, a yellow/blood tinged/brown discharge for 4-6 weeks is normal.   ??? Moderate cramping is normal for 1-3 days after the procedure    ACTIVITY:    ??? Do not travel beyond a 2 hour drive for 14 days postop. While complications are rare, you will be forced to go to an  unfamiliar emergency room and be seen by unfamiliar physicians.   ??? Advance your diet as tolerated.  ??? You may drive in 24 hours and when you are no longer taking pain medication during the day.   ??? A responsible adult needs to stay with you for the first 24 hours after your procedure.  ??? Recovery time varies with each patient, but most  can return to work the next day   ??? You can resume normal activities such as work, exercise, lifting, climbing stairs immediately or as you feel up to it.   ??? Refrain from hot tubs, tub baths and swimming pools for 3 days   ??? Refrain from sexual intercourse and tampons for 3 days.     WARNINGS    : Call our office [phone]) immediately if you have:  ??? Heavy bleeding (soaking sanitary napkin every 3 hours)   ??? Temperature>101  ??? Severe pelvic pain that does not improve with time or medication   ??? Difficult/painful urination  ??? Persistent/worsening nausea or vomiting   ??? Severe constipation unrelieved by a suppository or laxative.Instructions Following General Anesthesia    You were given medicines that made you unconscious while a procedure was performed. For as long as 24 hours following this procedure, you may have:  ??? Dizziness, weakness, or drowsiness  ??? Sore throat or hoarseness. ??? Nausea or vomiting     For the first 24 hours following anesthesia:  ?? Have a responsible person with you at all times.  ?? Do not participate in any activities where you could become injured for the next 24 hours, or until you feel normal again.  ?? Do not drive a car or operate heavy machinery.  ?? Do not drink alcohol, take sleeping pills, or other medications that cause drowsiness.  ?? Do not sign important papers or make important decisions.  ?? Only take medicine that has been prescribed by your caregiver.  For pain, only take over-the-counter or prescribed medicines for pain, discomfort, or fever as directed by your caregiver.  ?? If you wear a CPAP/BiPAP device, please wear it anytime that you  are resting or feel tired.  ?? You may resume a normal diet unless otherwise directed.  Vomiting may occur if you eat too soon.  When you can drink without vomiting, try water, juice, or soup.  Try solid foods if you feel little or no nausea.  ?? If you have questions or problems that seem related to anesthesia, call the hospital [phone]) and ask for the anesthesiologist on call.    SEEK IMMEDIATE MEDICAL CARE IF:   ??? You develop a rash.  ??? You have chest pain.  ??? You continue to feel sick to your stomach (nauseated) or throw up (vomit). ???  You have difficulty breathing.  ??? You develop any allergic reactions.  ??? You are unable to urinate.

## 2016-06-02 NOTE — Unmapped (Signed)
SURGEON: Maximino Sarin. Melvyn Neth, M.D.   ASSISTANT: Vernell Barrier, MD    DATE OF SURGERY: 06/02/2016     PREOPERATIVE DIAGNOSIS:   1. 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 Right breast calcifications    POSTOPERATIVE DIAGNOSIS:   1. 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 Right breast calcifications    PROCEDURE(S) PERFORMED:   1. Needle localized excisional biopsy of right breast biopsy site of atypical ductal hyperplasia and flat epithelial atypia (lateral wire)  2. Needle localized excisional biopsy of right breast biopsy of additional cluster of right breast calcifications (medial wire)    ANESTHESIA: Local/MAC     ESTIMATED BLOOD LOSS: minimal    SPECIMENS REMOVED:   1. Lateral wire right breast calcifications, short stitch superior, long stitch lateral   2. Medial wire right breast calcifications, short stitch superior, long stitch lateral     COMPLICATIONS: none    INDICATION FOR PROCEDURE: The patient is a 45 year old woman with bilateral implants who recently underwent a stereotactic biopsy of a new cluster of right breast calcifications which showed flat epithelial atypia and atypical ductal hyperplasia. Due to a possible 10-15% upgrade to a more significant lesion, needle localized excisional biopsy was recommended. An additional cluster of similar calcifications was also targeted for excisional. Risks, benefits, and alternatives were discussed with the patient who elected to proceed. The patient and her family are aware that following this operation, depending upon pathologic evaluation, she may require additional procedures including additional surgery for margins, more extensive surgery of the breast, or lymph node biopsy.     In discussing the localization of the second group of calcifications, Dr. Manson Passey was not clear that they had been localized as she could only clearly see them in a single view. The patient understood this concern.    DETAILS  OF PROCEDURE:   Needle localization was performed by Radiology and then the patient was brought to the operating room where she was placed supine on the OR table. Her right breast was prepped with ChloraPrep and draped in a sterile manner. A timeout was performed according to institutional standards.     After infiltrating the skin and deep tissue with local anesthetic, the lateral portion of her prior lateral periareolar incision was opened sharply. Dissection was performed superiorly to identify the lateral needle as it came through the skin. The needle and wire were transected at approximately the 2.5 cm marker on the needle. Electrocautery was used to dissect a cylinder of tissue around the distal 2 cm of the needle and wire in an effort to ensure removal of the clip as well as the calcifications. Once completely excised, two long stitches were placed in the specimen laterally and two short sutures superiorly. Similarly, dissection was performed superomedially to identify the medial needle as it came through the skin. The needle and wire were transected at approximately the 2.5 cm marker on the needle. Electrocautery was used to dissect a cylinder of tissue around the distal 1.5 cm of the needle and wire in an effort to remove the calcifications. Once completely excised, two long stitches were placed in the specimen laterally and two short sutures superiorly.    The specimens were then handed off and sent to Radiology for confirmation of clip removal. Shortly after, we received a call from radiology confirming that the clip and calcifications had indeed been removed with the lateral wire and the calcifications targeted with the  medial wire were also removed and in the medial specimen.    The biopsy cavity was irrigated and inspected and hemostasis was obtained. The incision was then closed using deep 3-0 inverted, interrupted Monocryl sutures and a running 4-0 Monocryl stitch. The skin was cleaned and dried and  a layer of Dermabond was applied.     The case was handed over to Dr. Darcella Gasman at this time.    I was present and scrubbed for the critical portions of the above operation and directed all aspects of its conduct. Instrument, sponge, and needle counts were correct at the completion of the case.

## 2016-06-02 NOTE — Unmapped (Signed)
RIGHT BREAST NEEDLE LOCALIZED EXCISIONAL BIOPSY OF 2 AREAS,     Procedure Note    OCEANIA NOORI  06/02/2016      Pre-op Diagnosis: Swelling, mass, or lump in chest [R22.2]  Calcification of right breast [R92.1]  Atypical ductal hyperplasia of right breast [N60.91]  Flat epithelial atypia (FEA) of right breast [N60.81]       Post-op Diagnosis: Swelling, mass, or lump in chest [R22.2]  Calcification of right breast [R92.1]  Atypical ductal hyperplasia of right breast [N60.91]  Flat epithelial atypia (FEA) of right breast [N60.81]    Procedure(s):  RIGHT BREAST NEEDLE LOCALIZED EXCISIONAL BIOPSY OF 2 AREAS,     HYSTEROSCOPIC RESECTION OF ENDOMETRIAL POLYP      Surgeon(s):  Cristal Generous, MD  Raylene Everts    Anesthesia: MAC (Monitor Anesthesia Care)    Staff:   Circulator: Sim Boast, RN  Scrub Person: Noralee Stain  Assistant: Elton Sin, CSA  Resident: Lorenz Coaster, MD    Estimated Blood Loss: Minimal                 Specimens:   Specimens     ID Description Commments Type Source Tests Collected By Collected At    A right breast lateral specimen.  short stitch superior, long stitch lateral A.  Right breast lateral specimen.  Short stitch superior, long stitch lateral Tissue Breast Right - SURGICAL PATHOLOGY EXAM Cristal Generous, MD 06/02/16 1619    B right breast medial specimen. short stitch superior, long stitch lateral B.  Right breast medial specimen.  Short stitch superior, long stitch lateral. Tissue Breast Right - SURGICAL PATHOLOGY EXAM Cristal Generous, MD 06/02/16 1624        Time of excision lateral specimen: 1615  Time of excision medial specimen: 1621  Time to radiology: 1628           Drains:             There were no complications unless listed below.        Maximino Sarin Denna Fryberger     Date: 06/02/2016  Time: 4:31 PM

## 2016-06-11 NOTE — Unmapped (Signed)
Patient left message requesting pathology results.   Returned call and informed patient pathology results were not back yet.

## 2016-06-14 ENCOUNTER — Ambulatory Visit: Admit: 2016-06-14 | Discharge: 2016-06-14 | Payer: PRIVATE HEALTH INSURANCE

## 2016-06-14 DIAGNOSIS — Z9889 Other specified postprocedural states: Secondary | ICD-10-CM

## 2016-06-14 NOTE — Progress Notes (Signed)
Ms. Mary [name] is a 45y.o. female in clinic for her first post-operative appointment following RIGHT breast needle localized excisional biopsy of 2 areas on 06/02/16, of previously identified atypia on core biopsy. Surgical pathology is pending.

## 2016-06-14 NOTE — Patient Instructions (Signed)
1.  Dr. Tresa Endo (medical oncology)  Ph# [phone]  [facility] [place])  [address]  [address]  [place], [place] [address]    Appointment: 9:50a on 06/21/2016      2.  Follow up in 6 months with right diagnostic mammogram    Dr. Lucille Passy staff contact information:     Dennie Fetters  Administrative Assistant    Salley Hews, RN  Nurse Clinician   Ph# [phone]    Main Office Ph# [phone] (24 hour on call line)  Fax# [phone]  Hours of Operation  Monday through Friday  8 am to 5 pm

## 2016-06-14 NOTE — Progress Notes (Signed)
History of Present Illness:   Mary [name] is a 45 y.o. female who presents for postoperative follow up after right breast needle localized excisional biopsy on 06/02/16. Patient feels well. States she is anxious about pathology results that are still pending.  Denies uncontrolled pain, fevers, chills, nausea, vomiting.    Right breast lesion under right implant remains stable. Patient desires to continue with surveillance of this area.    HPI          Histories:     She has a past medical history of ADD (attention deficit disorder); Anxiety; Asthma; Cervical cancer (19); Cervical dysplasia; Chronic back pain; Depression; Diverticulitis; Environmental allergies; Iron deficiency; Varicella; and Vitamin D deficiency.    She has a past surgical history that includes Cervical cone biopsy; arthroscopy knee menisectomy, meniscal repair (Right, 2013); augmentation breast endoscopic (2001); Augmentation mammaplasty (age 73-24); uterine biopsy ; biopsy breast needle localization (Right, 06/02/2016); and hysteroscopy ablation endometrial (N/A, 06/02/2016).    Her family history includes Allergies in her other; Asthma in her father and other; Bone cancer (age of onset: 42) in her paternal grandmother; Hypertension in her father; Uterine cancer (age of onset: 51) in her mother.    She reports that she has never smoked. She has never used smokeless tobacco. She reports that she drinks about 1.2 oz of alcohol per week . She reports that she does not use drugs.      Review of Systems   Constitutional: Negative for chills, fatigue and fever.   HENT: Negative for congestion and sinus pressure.    Respiratory: Negative for cough and shortness of breath.    Cardiovascular: Negative for chest pain, palpitations and leg swelling.   Gastrointestinal: Negative for abdominal pain, constipation, diarrhea, nausea and vomiting.   Genitourinary: Negative for difficulty urinating.   Musculoskeletal: Negative for back pain and joint  swelling.   Skin: Negative for rash.   Neurological: Negative for dizziness, light-headedness and headaches.   Psychiatric/Behavioral: Negative for confusion. The patient is not nervous/anxious.        Allergies:   Review of patient's allergies indicates no known allergies.    Medications:     Outpatient Encounter Prescriptions as of 06/14/2016   Medication Sig Dispense Refill    acetaminophen (TYLENOL) 325 MG tablet Take 650 mg by mouth every 6 hours as needed.      albuterol (PROAIR HFA) 90 mcg/actuation inhaler Inhale 1-2 puffs into the lungs every 4 hours as needed for Wheezing. q4-6hrs PRN 2 Inhaler 12    cetirizine (ZYRTEC) 1 mg/mL syrup Take 10 mLs (10 mg total) by mouth daily. 118 mL 12    dextroamphetamine-amphetamine (ADDERALL) 10 mg Tab Take 1 tablet (10 mg total) by mouth 3 times a day. 90 tablet 0    escitalopram oxalate (LEXAPRO) 10 MG tablet Take 1 tablet (10 mg total) by mouth daily. 90 tablet 3    fluticasone-salmeterol (ADVAIR DISKUS) 500-50 mcg/dose DsDv Inhale 1 puff into the lungs 2 times a day. 1 Inhaler 12    melatonin 10 mg Cap Take by mouth.      omeprazole (PRILOSEC) 40 MG capsule Take 1 capsule (40 mg total) by mouth every morning before breakfast. 30 capsule 12    [DISCONTINUED] HYDROcodone-acetaminophen (NORCO) 5-325 mg per tablet Take 1 tablet by mouth every 6 hours as needed for Pain.   Earliest Fill Date: 06/02/16 10 tablet 0     No facility-administered encounter medications on file as of 06/14/2016.  Objective:       There were no vitals taken for this visit.    Physical Exam   Nursing note and vitals reviewed.  Constitutional: She is oriented to person, place, and time. She appears well-developed and well-nourished.   HENT:   Head: Normocephalic and atraumatic.   Eyes: Conjunctivae and EOM are normal. Pupils are equal, round, and reactive to light.   Cardiovascular: Normal rate, regular rhythm and normal heart sounds.    No murmur heard.  Pulmonary/Chest: Effort normal  and breath sounds normal. She has no wheezes. Right breast exhibits skin change (incision healing well without surrounding erythema or purulence). Right breast exhibits no inverted nipple, no nipple discharge and no tenderness. Left breast exhibits no inverted nipple, no mass, no nipple discharge, no skin change and no tenderness. Breasts are symmetrical.       Abdominal: Soft. Bowel sounds are normal.   Musculoskeletal: Normal range of motion.   Neurological: She is alert and oriented to person, place, and time.   Skin: Skin is warm.   Psychiatric: She has a normal mood and affect. Her behavior is normal.       Review of Lab Results:      Lab Results   Component Value Date    WBC 12.6 (H) 09/12/2015    HGB 13.0 09/12/2015    HCT 40.0 09/12/2015    PLT 248 09/12/2015    GLUCOSE 100 09/12/2015    CREATININE 0.66 09/12/2015    NA 134 09/12/2015    K 3.5 09/12/2015    CL 101 09/12/2015    CO2 29 09/12/2015    BILITOT 1.1 09/12/2015    PROT 7.5 09/12/2015    AST 12 (L) 09/12/2015    ALT 9 09/12/2015    ALKPHOS 55 09/12/2015       Imaging:       Pathology:   Right breast, excisional biopsy: pathology pending, verbal preliminary read from pathology likely flat epithelial atypia.    Right breast, core biopsy:  - Breast tissue with dense fibrosis and extensive FEA/ADH, which is present  in six of seven cores, in a patchy manner, the largest focus measuring 5  mm.  - Microcalcifications are associated with FEA/ADH.  - No invasive malignancy.    Cancer Staging:   No matching staging information was found for the patient.       Assessment:   45 y.o. female presents for postoperative follow up after right needle localized excisional biopsy 06/02/16.     Plan:   Discussed preliminary pathology results. Will call patient with final pathology.     Given atypical ductal hyperplasia and flat epithelial atypia on ultrasound guided biopsy, referral to medical oncology placed for discussion medical therapy for breast cancer risk  reduction.    Return to clinic in 6 months with screening mammogram or sooner for any new breast changes, concerns, or complaints.       Patient seen by and discussed with Dr. Melvyn Neth.    Lorenz Coaster, MD  Ob/Gyn PGY-1    I saw and examined the patient.  I discussed with the resident or fellow and agree with Dr. Lacie Draft findings. I have edited the note and agree with the assessment and plan.    Maximino Sarin. Melvyn Neth, MD  Breast Surgical Oncology

## 2016-06-16 NOTE — Telephone Encounter (Addendum)
Informed patient of finalized pathology results. Patient verbalized understanding and has no further questions. Requested copy be mailed to her- address confirmed.

## 2016-06-17 DIAGNOSIS — Z9889 Other specified postprocedural states: Secondary | ICD-10-CM

## 2016-06-18 MED ORDER — PROAIR HFA 90 mcg/actuation inhaler
90 | RESPIRATORY_TRACT | 11 refills | Status: AC
Start: 2016-06-18 — End: 2017-10-09

## 2016-06-21 ENCOUNTER — Ambulatory Visit: Admit: 2016-06-21 | Discharge: 2016-06-21 | Payer: PRIVATE HEALTH INSURANCE

## 2016-06-21 DIAGNOSIS — Z9889 Other specified postprocedural states: Secondary | ICD-10-CM

## 2016-06-21 NOTE — Progress Notes (Addendum)
Ref: Cristal Generous, MD  [address]  [facility] [place], [place] [address]    Chief Complaint   Patient presents with    New Patient Visit/ Consultation     ductal hyperplasia in breast          Dx:    History of Present Illness  Mary [name] is a 45 y.o. female, referred by Jethro Poling  for right breast flat epithelial atypia and atypical ductal hyperplasia. She states that she went for breast imaging as she noted a lump lateral to her right breast. 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 However, 2 clusters of calcifications were noted in her right breast. 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 Excisional biopsy of both clusters was recommended.    She does have bilateral pre-pectoral saline implants which were placed about 16 years ago through inframammary incisions (initial set about 23 years ago through periareolar implants). She is not sure that she has her implant cards and doesn't recall who placed them but it was in [place].  S/p  right breast needle localized excisional biopsy on 06/02/16. Patient feels well.    Denies uncontrolled pain, fevers, chills, nausea, vomiting.    Right breast lesion under right implant remains stable. Patient desires to continue with surveillance of this area.      Breast History:  Race: Caucasian  Ashkenazi Jewish, [place], or Amish ancestry: no  Age of Menarche: 89  LMP: 06/04/16  Gravida Para Aborta: 2/2/0  Age at first childbirth: 56  Assisted Reproduction Treatments: No  History of breast feeding: Yes- 4 months with 2nd child  History of hormonal contraceptives: Yes - 8-10 years, pill in 20s  History of HRT: No (If yes, how many years taken and what ages taken?)  Prior history of breast biopsy if known: No  Family history of breast or ovarian cancer: no (see family history section for details)  Family history of other cancers: yes (see family history section for details)  Family  history of genetic testing and results: No  Patient interested in future childbearing: no  Patient interested in meeting with a fertility specialist: no    Last Mammogram: 04/2016  Last colonoscopy: none  Last PAP: 04/2016         Histories:     She has a past medical history of Environmental allergies; Asthma; Chronic back pain; Cervical dysplasia; Anxiety; Depression; ADD (attention deficit disorder); Varicella; and Diverticulitis.    She has past surgical history that includes Cervical cone biopsy; arthroscopy knee menisectomy, meniscal repair; and augmentation breast endoscopic.    Her family history includes Allergies in her other; Asthma in her father and other; Bone cancer in her other; Cancer in her mother; Hypertension in her father. There is no history of Melanoma.    She reports that she has never smoked. She has never used smokeless tobacco. She reports that she drinks about 1.2 oz of alcohol per week. She reports that she does not use illicit drugs.      All other ROS is negative except as noted above.           Review of Systems   Constitutional: Negative for activity change, appetite change, chills, diaphoresis, fatigue and fever.   Respiratory: Negative for cough, chest tightness and shortness of breath.    Cardiovascular: Negative for chest pain, palpitations and leg swelling.   Gastrointestinal: Negative for abdominal  distention, blood in stool, constipation, diarrhea, nausea and vomiting.   Genitourinary: Negative for difficulty urinating, dysuria, hematuria and menstrual problem.   Musculoskeletal: Negative for arthralgias, back pain and joint swelling.   Skin: Negative for color change, pallor and rash.   Neurological: Negative for dizziness, weakness, light-headedness, numbness and headaches.   Hematological: Negative for adenopathy. Does not bruise/bleed easily.   Psychiatric/Behavioral: Negative for dysphoric mood. The patient is not nervous/anxious.    All other systems reviewed and are  negative.     by Katina Dung CNP.   06/21/2016      PHYSICIAN  ACKNOWLEDGEMENT:      I have personally performed a face to face diagnostic evaluation on this patient. I have reviewed and agree with the care plan.  I agree with the history of present illness, past medical histories, family history, social history, medication list, and allergies as listed. The review of systems is as noted above. All other ROS is negative except as noted above. I agree with the assessment and plan as noted above  by   CNP .     History and Exam by me shows:    Mary [name] is a 45 y.o. female with The encounter diagnosis was Atypical ductal hyperplasia of right breast.          Vitals  Blood pressure 134/74, pulse 82, temperature 99.1 F (37.3 C), temperature source Oral, resp. rate 16, height 5' 6 (1.676 m), weight 153 lb 6.4 oz (69.6 kg), last menstrual period 06/04/2016, SpO2 99 %.    Physical Exam   Neurologic Exam    Physical Exam :    Vitals reviewed.  Pertinent positive findings: bil saline implants,healed scar from recent NLOC biopsy  the mass along her chest wall which ,Dr.Jamie Lewis feels  is likely a lipoma.  Constitutional:  oriented to person, place, and time.  appears well-developed and well-nourished. No distress.   Head: Normocephalic and atraumatic.    Mouth/Throat: Oropharynx is clear and moist.   Eyes: Conjunctivae and EOM are normal.   Neck: Normal range of motion. Neck supple. Thyroid: nl  Cardiovascular: Normal rate, regular rhythm and normal heart sounds.    Pulmonary/Chest: Effort normal. No respiratory distress.    Abdominal: Soft. Bowel sounds normal.  no distension and no mass.  no tenderness.   Musculoskeletal: Normal range of motion.  no edema and no tenderness.   Gait: normal  Lymphadenopathy:  no cervical/ axillary/ inguinal  adenopathy.   Neurological:  alert and oriented to person, place, and time.  Coordination normal. Strength / Sensations: grossly normal.   Skin: Skin is warm and dry. No  rash noted. No erythema.   Psychiatric:   normal mood and affect.   behavior is normal. Judgment and thought content normal.           Assessment & Plan  Mary [name] is a 45 y.o. female White or Caucasian      left side breast pathology - see below.     Comorbidities: anxiety, asthma,  depression ; H/o cerv ca in past, endometrial ca in mother at age 6, in remission.    77 yr old premenopausal lady with ALH, ADH FEA on biopsy. H/o cerv ca in past, endometrial ca in mother at age 18, in remission.     By breast cancer risk calculators , her risk of developing breast cancer in her lifetime is roughly between 20 -40% ( compared to 11 - 20% in normal person  of her age). Those who were found to have ALH or ADH on biopsy were noted to have a higher life time risk of breast cancer in various studies.     We discussed various ways to reduce breast cancer risk.   Went over risk factors for breast cancer and risk reduction including not smoking, avoiding execcsive ETOH use, role of Aspirin, exercise, maintaining ideal BMI  and avoiding any hormonal supplements.      Tamoxifen is another option:  side effects of tamoxifen include blood clots, pulmonary embolism , uterine risk (3-4%) if taken for 5 yrs, hot flashes and menopausal symptoms.  Tamoxifen will reduce the risk of progression to breast cancer by 50%.     She will read up on this, and let us know if she is itnerested in starting on tamoxifen daily to reduce risk. She will need to take it for 5 yrs.     If starting on tamoxifen, will get cbc,cmp from Dr. Electa Sniff office.     She is also on ferrous sulphate twice daily for iron deficiency.  Probably due to irregular, heavy bleeding. She recently had a uterine polyp removed .     Next mammogram in 40m.     follow up if taking tamoxifen in 4 wks after starting. She will contact us with her final decision. She will follow up with Dr.Jamie Lewis for her follow up mammogram, if she does not want to start on  tamoxifen for chemoprophylaxis.        It is a pleasure to assist in the care of Mary [name]  Please call with any questions. All questions and concerns were addressed to the patient/family. Alternatives to  treatment were discussed. The note was completed using EMR. Every effort was made to ensure accuracy; however, inadvertent computerized/ typographical  errors may be present.      No orders of the defined types were placed in this encounter.                            Future Appointments  Date Time Provider Department Center   12/13/2016 11:00 AM [facility] MAMMO 2 [facility]   12/13/2016 12:30 PM Cristal Generous, MD [facility]                Histories   She has a past medical history of ADD (attention deficit disorder); Anxiety; Asthma; Cervical cancer (19); Cervical dysplasia; Chronic back pain; Depression; Diverticulitis; Environmental allergies; Iron deficiency; Varicella; and Vitamin D deficiency.    She has a past surgical history that includes Cervical cone biopsy; arthroscopy knee menisectomy, meniscal repair (Right, 2013); augmentation breast endoscopic (2001); Augmentation mammaplasty (age 42-24); uterine biopsy ; biopsy breast needle localization (Right, 06/02/2016); and hysteroscopy ablation endometrial (N/A, 06/02/2016).    Her family history includes Allergies in her other; Asthma in her father and other; Bone cancer (age of onset: 60) in her paternal grandmother; Hypertension in her father; Uterine cancer (age of onset: 61) in her mother.    She reports that she has never smoked. She has never used smokeless tobacco. She reports that she drinks about 1.2 oz of alcohol per week . She reports that she does not use drugs.    Allergies  Review of patient's allergies indicates no known allergies.    Medications  Current Outpatient Prescriptions   Medication Sig    acetaminophen Take 650 mg by mouth every 6  hours as needed.    cetirizine Take 10 mLs (10 mg total) by mouth daily.     cholecalciferol (vitamin D3) Take by mouth.    dextroamphetamine-amphetamine Take 1 tablet (10 mg total) by mouth 3 times a day.    escitalopram oxalate Take 1 tablet (10 mg total) by mouth daily.    FERROUS SULFATE (IRON ORAL) Take by mouth.    fluticasone-salmeterol Inhale 1 puff into the lungs 2 times a day.    melatonin Take by mouth.    omeprazole Take 1 capsule (40 mg total) by mouth every morning before breakfast.    PROAIR HFA INHALE 1 TO 2 PUFFS INTO THE LUNGS EVERY 4 TO 6 HOURS AS NEEDED FOR WHEEZING     No current facility-administered medications for this visit.          The following portions of the patient's history were reviewed and updated as appropriate: allergies, current medications, past family history, past medical history, past social history, past surgical history and problem list.    Review of Lab Results  Lab Results   Component Value Date    WBC 12.6 (H) 09/12/2015    HGB 13.0 09/12/2015    HCT 40.0 09/12/2015    PLT 248 09/12/2015    CREATININE 0.66 09/12/2015    BUN 8 09/12/2015    PROT 7.5 09/12/2015    AST 12 (L) 09/12/2015    ALT 9 09/12/2015    BILITOT 1.1 09/12/2015    CALCIUM 9.3 09/12/2015            Imaging  017 4:58 PM EDT      Investigations Reviewed:   Mammogram:    Pathology:           Medical Decision Making:  The following items were considered in medical decision making:  Obtain records and history from outside facility/provider  Review / order clinical lab tests  Review / order radiology tests  Review / order other diagnostic tests/interventions  Reviewed outside records

## 2016-06-21 NOTE — Patient Instructions (Signed)
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 H/o cerv ca in past, endometrial ca in mother at age 42, in remission.     By breast cancer risk calculators , her risk of developing breast cancer in her lifetime is roughly between 20 -40% ( compared to 11 - 20% in normal person of her age).     We discussed various ways to reduce breast cancer risk.   Went over risk factors for breast cancer and risk reduction including not smoking, avoiding execcsive ETOH use, role of Aspirin, exercise, maintaining ideal BMI  and avoiding any hormonal supplements.      Tamoxifen is another option:  side effects of tamoxifen include blood clots, pulmonary embolism , uterine risk (3-4%) if taken for 5 yrs, hot flashes and menopausal symptoms.  Tamoxifen will reduce the risk of progression to breast cancer by 50%.     She will read up on this, and let us know if she is itnerested in starting on tamoxifen daily to reduce risk. She will need to take it for 5 yrs.     If starting on tamoxifen, will get cbc,cmp from Dr. Electa Sniff office.   She is also on ferrous sulphate twice daily for iron deficiency.  Probably due to irregular, heavy bleeding. She recently had a uterine polyp removed .     Next mammogram in 59m.     follow up if taking tamoxifen in 4 wks after starting.     TAMOXIFEN  (ta-MOKS-i-fen)  Brand Name: Nolvadex (nole-VA-dex)  Patient Education Quick Reference Guide    Uses For This Medicaiton   Tamoxifen is used to treat adults with breast cancer. It is also used to prevent breast cancer in adults who are at a high risk of developing the disease.   This medication may also be given for other conditions as determined by your doctor.  How This Medication Works  How This Medication Works  Tamoxifen works by blocking the effects of estrogen, a hormone which causes some breast cancer cells to grow. When estrogen is blocked, the breast cancer cells, which need estrogen to grow, will not be able to grow.    Benefits of  This Medication  Tamoxifen is used to treat breast cancer several different ways. It is used as an additional therapy after surgery for breast cancer, which is in the early stages. It can also be beneficial in the treatment of disease that has metastasized, or spread, to other parts of the body. Tamoxifen is also used to prevent the development of breast cancer in women who are at a high risk of developing the disease. Finally, tamoxifen has also been shown to prevent bone loss.  Who Should Not Take This Medication  Who Should Not Take This Medication  You should not take this medication if you:   Are allergic to tamoxifen or to any of its components   Have a history of blood clots in the legs or lungs   Are taking the medication warfarin (Coumadin)  NOTE: If you have a history of blood clots in the legs or lungs or are taking warfarin (Coumadin), you should talk with your doctor or healthcare provider about whether tamoxifen is safe for you to take.  Precautions to be Aware of Before Taking This Medication  Precautions to be Aware of Before Taking This Medication  Blood related precautions   Rarely, this medication may temporarily reduce the number of platelets in your blood. This can increase your  risk of bleeding. Contact your doctor or healthcare provider if you notice unusual bleeding or bruising, have black or tar-like stools, see blood in your urine, or develop pinpoint red spots on your skin. If your platelet levels become too low, your doctor or healthcare provider may recommend that your treatment be delayed, that you receive a platelet transfusion, and/or that you take medication to help increase the number of platelets in your blood. To lower your chance of bleeding, do not use aspirin, aspirin-containing medications, or aspirin-like medications (for example ibuprofen, naproxen). Use caution with sharp objects like razors and nail cutters, and avoid activities that can cause cuts, bumps and  bruises.      Organ related precautions   Rarely, this medication can cause changes in the ability of your liver to work normally. In addition, this medication is broken down by enzymes (chemicals) in the liver and cleared from the body. In patients with severe liver disease, tamoxifen may not break down normally which can lead to high levels of this medication in the body and a greater chance of side effects. Your doctor or healthcare provider will check your liver regularly, usually through blood tests, while you are on this medication to make sure that your liver is working properly. If you have any liver problems before starting this medication, make sure that you tell your doctor or healthcare provider so that he or she can watch you carefully for possible problems or side effects.   Rarely, this medication can increase the risk of eye problems, including the development of cataracts, scarring of the cornea, or changes in the retina, all of which can cause vision problems. If you experience any vision changes while on this medication, let your doctor or healthcare provider know.    Miscellaneous precautions   This medication can increase the risk of uterine and endometrial cancer. You should talk with your doctor or healthcare provider about having regular pelvic examinations. If you experience symptoms of abnormal vaginal bleeding (for example, lasting longer than usual or bleeding between cycles), changes  in menstruation, changes in vaginal discharge, pelvic pain or pressure, you should let your doctor or healthcare provider know immediately. If you have ever had surgery to remove your uterus, then you are not at an increased risk for these cancers.   Rarely, blood clots have been reported in patients receiving tamoxifen. If you have ever had a blood clot, you should let your doctor or healthcare provider know. Contact your doctor or healthcare provider immediately if you experience pain or swelling in  your leg(s) or if you experience sudden shortness of breath or difficulty breathing, sharp chest pain, sudden and severe headache, or pain that  radiates down the shoulder and arm.   Rarely, this medication can increase the risk of changes to the lining of the uterus which can lead to polyps, ovarian cysts, and changes in menstruation.   This medication can cause a condition known as tumor flare during the first few weeks of treatment, especially in patients who have breast cancer which has metastasized, or spread, to the bone. Tumor flare is a syndrome which causes muscle pain and redness along with increased size of tumors. The size of these tumors will later decrease once this syndrome has subsided. Let your doctor or healthcare provider know if you are experiencing muscle pain and redness that do not get better.  Precautions (continued)  Pregnancy and breastfeeding precautions   This medication may cause fetal harm. When taking this  medication and for two months after stopping it, you should use effective birth control to prevent pregnancy. Tell your doctor or healthcare provider right away if you or your spouse/partner becomes pregnant.   You should not take hormonal birth control (pills, patches, rings, implants, injections) while on tamoxifen since this medication can stop hormonal birth control methods from working. Barrier type methods of birth control (condoms, diaphragms) should be used instead if needed.   It is not known whether this medication is found or excreted in breast milk. Since many medications are excreted in breast milk and because this medication can cause serious harmful reactions in infants, breastfeeding should be avoided.   Many anti-cancer therapies can cause sterility. Notify your doctor or healthcare provider if you want to have children in the future.    Medication and Food Interactions  Before using this medication, tell your doctor or healthcare provider of all prescription  or over-the-counter products you are taking, including dietary supplements or vitamins, herbal medicines and homeopathic remedies. Do not start or stop any medication without your doctor or healthcare providers approval. Possible interactions can occur with tamoxifen and the following foods or medications:   Aminoglutethimide (Cytadren?)   Amiodarone (Cordarone?)   Barbiturates   Birth control pills or other estrogen containing medications   Bosentan (Tracleer?)   Carbamazepine   Cyclosporine   Delavirdine (Rescriptor?)   Diltiazem   Efavirenz (Sustiva?)   Erythromycin   Ethosuximide (Zarontin?)   Fosphenyotin (Cerebyx?)   Grapefruit or grapefruit juice   Imatinib mesylate (Gleevec?)   Ketozonazole (Nizoral?)   Letrozole (Femara?)   Mitomycin C (Mutamycin?)   Nevirapine (Viramune?)   Nifedipine   Phenytoin (Dilantin?)   Phenobarbital   Rifampin (Rifadin?)   Voriconazole (Vfend?)   Warfarin (Coumadin?)    NOTE: This list may not include all medications that can have interactions with tamoxifen.  Side Effects  Side Effects   All medications can cause side effects. However, not all patients will experience these side effects. In addition, other side effects not listed can also occur in some patients. You should call your doctor or healthcare provider if you have any questions or concerns while you are on this medication.   You should contact your doctor or healthcare provider if you experience any side effect(s) which dont go away, worsen, are serious in nature, or are worrisome to you.   Side effects can occur when tamoxifen is given alone or together with other chemotherapy medications. The side effects listed below are those reported in patients who were treated with tamoxifen alone.  More common side effects   Hot flashes   Vaginal discharge   Temporary feeling of tiredness when starting tamoxifen therapy   Water retention and weight gain    Less common side effects   Irregular  menstrual bleeding   Changes in mood   Increased bone and/or tumor pain shortly after starting treatment (see Precautions To Be Aware Of Before Taking This Medication)    Rare side effects  This is not a complete list of the rare side effects reported with tamoxifen. For a complete list, please talk with your doctor or healthcare provider about reviewing a copy of the package insert.   Headache, dizziness   Nausea, vomiting, loss of appetite, stomach cramps   Visual changes or the development of cataracts (see Precautions To Be Aware Of Before Taking This Medication)   Rash   Swelling/pain in hands or feet   Chest pain   Shortness of  breath   Blood clots in the veins or lungs (see Precautions To Be Aware Of Before Taking This Medication)   Stroke (see Precautions To Be Aware Of Before Taking This Medication   Hair thinning   Decreased platelet and/or white blood cell levels (see Precautions To Be Aware Of Before Taking This Medication)   Increase in calcium blood level   Liver problems including jaundice (yellowing of the skin and eyes) (see Precautions To Be Aware Of Before Taking This Medication)   Changes in the lining of the uterus (see Precautions To Be Aware Of Before Taking This Medication)  This Medication  How to Use This Medication   This medication is taken by mouth (orally) with or without food. Tamoxifen tablets are to be taken with water or another non-alcoholic liquid. Take this medication exactly as directed by your doctor or healthcare provider. If you do not understand these instructions, ask your doctor or healthcare provider to explain them to you. Tamoxifen is usually taken for five years.   It is important that you only use this medication when its been prescribed for you. Sharing this medication with someone for whom it is not prescribed could cause harm.   If you miss a dose of tamoxifen, take it when you remember, then take the next dose as usual. If it is almost time for  your next dose or you remember at your next dose, do not take extra tablets to make up the missed dose.   If you accidentally take too many pills or someone else accidentally takes your medication, contact your doctor, your local poison control center at [phone], or emergency services immediately.  Proper Storage  Proper Storage   Store this medication at room temperature and keep in its original container.   Keep this medication out of the reach of children or pets.   Ask your doctor or healthcare provider how to dispose of any medicine that you longer use.  Disclaimer  Disclaimer  This handout is to provide you with additional information about tamoxifen. It is not a substitute or replacement for the expertise and judgment of your healthcare provider. The information is not intended to cover all possible uses, directions, precautions, medication interactions, or side effects. In addition, this information should not be interpreted to suggest that the use of a particular medication is safe, appropriate, or effective for you. You should always talk with your healthcare provider before starting or stopping any medication.    A  Tamoxifen (Nolvadex) Patient Education Quick Reference Guide    [employer]. All Rights Reserved

## 2016-06-25 ENCOUNTER — Ambulatory Visit: Admit: 2016-06-25 | Payer: PRIVATE HEALTH INSURANCE

## 2016-06-25 DIAGNOSIS — F988 Other specified behavioral and emotional disorders with onset usually occurring in childhood and adolescence: Secondary | ICD-10-CM

## 2016-06-25 MED ORDER — dextroamphetamine-amphetamine (ADDERALL) 10 mg Tab
10 | ORAL_TABLET | Freq: Three times a day (TID) | ORAL | 0 refills | Status: AC
Start: 2016-06-25 — End: 2016-10-26

## 2016-06-25 MED ORDER — dextroamphetamine-amphetamine (ADDERALL) 10 mg Tab
10 | ORAL_TABLET | Freq: Three times a day (TID) | ORAL | 0 refills | Status: AC
Start: 2016-06-25 — End: 2016-09-09

## 2016-06-25 MED ORDER — dextroamphetamine-amphetamine (ADDERALL) 10 mg Tab
10 | ORAL_TABLET | Freq: Three times a day (TID) | ORAL | 0 refills | Status: AC
Start: 2016-06-25 — End: 2016-06-25

## 2016-06-25 NOTE — Progress Notes (Signed)
Subjective   HPI:   Patient ID: Mary [name] is a 45 y.o. female.    Chief Complaint:  HPI         The patient's chart was reviewed prior to the visit as part of pre-visit planning  No chief complaint on file.     Problems:fu ADD    Doing well c/w meds : takes adderall  10mg  2-4x/d, on average 20-30mg , sometimes i get super tired and cant concentrate in the afternoon and may have to increase to 4 tablets, but not too often    In the past reported had developed some tolerance w it and wanting to get off it,  Denies any s/e x occly f/x sleep  Needs refills  Denies diversion or        Past Medical History:   Diagnosis Date    ADD (attention deficit disorder)     Anxiety     Asthma     Cervical dysplasia     Chronic back pain     Depression     Diverticulitis     Environmental allergies     Iron deficiency     Varicella     Vitamin D deficiency        Current Outpatient Prescriptions   Medication Sig Dispense Refill    acetaminophen (TYLENOL) 325 MG tablet Take 650 mg by mouth every 6 hours as needed.      cetirizine (ZYRTEC) 1 mg/mL syrup Take 10 mLs (10 mg total) by mouth daily. 118 mL 12    cholecalciferol, vitamin D3, (VITAMIN D3) 5,000 unit Tab Take by mouth.      dextroamphetamine-amphetamine (ADDERALL) 10 mg Tab Take 1 tablet (10 mg total) by mouth 3 times a day. 90 tablet 0    dextroamphetamine-amphetamine (ADDERALL) 10 mg Tab Take 1 tablet (10 mg total) by mouth 3 times a day. 90 tablet 0    escitalopram oxalate (LEXAPRO) 10 MG tablet Take 1 tablet (10 mg total) by mouth daily. 90 tablet 3    FERROUS SULFATE (IRON ORAL) Take by mouth.      fluticasone-salmeterol (ADVAIR DISKUS) 500-50 mcg/dose DsDv Inhale 1 puff into the lungs 2 times a day. 1 Inhaler 12    melatonin 10 mg Cap Take by mouth.      omeprazole (PRILOSEC) 40 MG capsule Take 1 capsule (40 mg total) by mouth every morning before breakfast. 30 capsule 12    PROAIR HFA 90 mcg/actuation inhaler INHALE 1 TO 2 PUFFS INTO THE  LUNGS EVERY 4 TO 6 HOURS AS NEEDED FOR WHEEZING 18 g 11     No current facility-administered medications for this visit.           ROS:   Review of Systems  as above   Objective:   Physical Exam      Wt Readings from Last 3 Encounters:   06/21/16 153 lb 6.4 oz (69.6 kg)   06/14/16 152 lb 8 oz (69.2 kg)   06/02/16 145 lb (65.8 kg)         Vitals:    06/25/16 1351   BP: 122/80   Temp: 99.4 F (37.4 C)     There is no height or weight on file to calculate BMI.  There is no height or weight on file to calculate BSA.    NAD  Psych: A&Ox3; reactive affect; no obv thought d/o       Lab Results   Component Value Date    WBC  12.6 (H) 09/12/2015    HGB 13.0 09/12/2015    HCT 40.0 09/12/2015    MCV 86.4 09/12/2015    PLT 248 09/12/2015       No results found for: HGBA1C  No components found for: GLUF,  MICROALBUR,  LDLCALC,  CREATININE    Lab Results   Component Value Date    GLUCOSE 100 09/12/2015       No results found for: LIPIDCOMM          Assessment/Plan:     Time in: 144  Time out:      10 mins, Greater than 50% of time spent in face to face counselling/coordination of care      A/P:  1. ADD- oarrs report:  no problems; uds done in the past: no red flags;  IR adderall: cont w 20-40mg /d  2. HM: fluvax    RTC: 3 months    New meds:   none  Patient education given  none  Patient verbalized understanding & agreement of the proposed plan.

## 2016-08-23 MED ORDER — fluticasone (FLONASE) 50 mcg/actuation nasal spray
50 | NASAL | 0 refills | Status: AC
Start: 2016-08-23 — End: ?

## 2016-08-23 MED ORDER — ADVAIR DISKUS 500-50 mcg/dose DsDv
500-50 | RESPIRATORY_TRACT | 0 refills | Status: AC
Start: 2016-08-23 — End: 2016-10-26

## 2016-09-09 MED ORDER — dextroamphetamine-amphetamine (ADDERALL) 10 mg Tab
10 | ORAL_TABLET | Freq: Three times a day (TID) | ORAL | 0 refills | Status: AC
Start: 2016-09-09 — End: 2016-10-26

## 2016-09-09 NOTE — Telephone Encounter (Signed)
L/m for pt that rx was at front desk ready for p/u per Dr Thedore Mins

## 2016-09-09 NOTE — Unmapped (Signed)
Patient due/soon to be due for refill of restricted medicine  I will be out of town and unable to see patient when rx due  Chart reviewed  rx printed x 1 month refill  Patient to be contacted to come to pick up  Follow up next month for follow up and refills  See oarrs report below  Controlled Substance Monitoring 09/03/2015 11/13/2015 09/09/2016   OARRS/eKASPER Status - Reviewed Reviewed   OARRS/eKASPER Consistent with Prescriber Expectation Jerrel Ivory     I have completed the required OARRS/eKASPER documentation for this patient on 09/09/2016.  Arzu Mcgaughey K Thedore Mins

## 2016-10-26 ENCOUNTER — Ambulatory Visit: Admit: 2016-10-26 | Payer: PRIVATE HEALTH INSURANCE

## 2016-10-26 DIAGNOSIS — F988 Other specified behavioral and emotional disorders with onset usually occurring in childhood and adolescence: Secondary | ICD-10-CM

## 2016-10-26 MED ORDER — dextroamphetamineamphetamineADDERALL10mgTab
10 | ORAL_TABLET | Freq: Three times a day (TID) | ORAL | 0 refills | Status: AC
Start: 2016-10-26 — End: 2016-10-26

## 2016-10-26 MED ORDER — dextroamphetamine-amphetamine (ADDERALL) 10 mg Tab
10 | ORAL_TABLET | Freq: Three times a day (TID) | ORAL | 0 refills | Status: AC
Start: 2016-10-26 — End: 2017-03-08

## 2016-10-26 MED ORDER — fluticasone-salmeterol (ADVAIR DISKUS) 500-50 mcg/dose DsDv
500-50 | Freq: Two times a day (BID) | RESPIRATORY_TRACT | 3 refills | Status: AC
Start: 2016-10-26 — End: 2017-11-04

## 2016-10-26 MED ORDER — predniSONE (DELTASONE) 20 MG tablet
20 | ORAL_TABLET | ORAL | 1 refills | Status: AC
Start: 2016-10-26 — End: 2016-11-02

## 2016-10-26 MED ORDER — dextroamphetamine-amphetamine (ADDERALL) 10 mg Tab
10 | ORAL_TABLET | Freq: Three times a day (TID) | ORAL | 0 refills | Status: AC
Start: 2016-10-26 — End: 2017-02-10

## 2016-10-26 NOTE — Unmapped (Signed)
Subjective   HPI:   Patient ID: Mary [name] is a 46 y.o. female.    Chief Complaint:  HPI         The patient's chart was reviewed prior to the visit as part of pre-visit planning  Chief Complaint   Patient presents with   ??? Medication Refill     med refill       Problems:fu ADD, asthma    Doing well c/w meds : takes adderall  10mg  2-4x/d, on average 20-30mg , sometimes i get super tired and cant concentrate in the afternoon and may have to increase to 4 tablets, but not too often    Denies any s/e  Needs refills  Denies diversion or st/drug [facility]ise  Also needs refill advair: not doing well, has been out of this for some time     Past Medical History:   Diagnosis Date   ??? ADD (attention deficit disorder)    ??? Anxiety    ??? Asthma    ??? Cervical dysplasia    ??? Chronic back pain    ??? Depression    ??? Diverticulitis    ??? Environmental allergies    ??? Iron deficiency    ??? Varicella    ??? Vitamin D deficiency        Current Outpatient Prescriptions   Medication Sig Dispense Refill   ??? acetaminophen (TYLENOL) 325 MG tablet Take 650 mg by mouth every 6 hours as needed.     ??? ADVAIR DISKUS 500-50 mcg/dose DsDv INHALE 1 PUFF INTO THE LUNGS TWICE DAILY 60 g 0   ??? cetirizine (ZYRTEC) 1 mg/mL syrup Take 10 mLs (10 mg total) by mouth daily. 118 mL 12   ??? cholecalciferol, vitamin D3, (VITAMIN D3) 5,000 unit Tab Take by mouth.     ??? dextroamphetamine-amphetamine (ADDERALL) 10 mg Tab Take 1 tablet (10 mg total) by mouth 3 times a day. 90 tablet 0   ??? dextroamphetamine-amphetamine (ADDERALL) 10 mg Tab Take 1 tablet (10 mg total) by mouth 3 times a day. 90 tablet 0   ??? escitalopram oxalate (LEXAPRO) 10 MG tablet Take 1 tablet (10 mg total) by mouth daily. 90 tablet 3   ??? FERROUS SULFATE (IRON ORAL) Take by mouth.     ??? fluticasone (FLONASE) 50 mcg/actuation nasal spray SHAKE LIQUID AND USE 1 TO 2 SPRAYS IN EACH NOSTRIL DAILY AS NEEDED 16 g 0   ??? melatonin 10 mg Cap Take by mouth.     ??? omeprazole (PRILOSEC) 40 MG capsule Take 1  capsule (40 mg total) by mouth every morning before breakfast. 30 capsule 12   ??? PROAIR HFA 90 mcg/actuation inhaler INHALE 1 TO 2 PUFFS INTO THE LUNGS EVERY 4 TO 6 HOURS AS NEEDED FOR WHEEZING 18 g 11     No current facility-administered medications for this visit.           ROS:   Review of Systems  as above   Objective:   Physical Exam      Wt Readings from Last 3 Encounters:   10/26/16 153 lb 9.6 oz (69.7 kg)   06/21/16 153 lb 6.4 oz (69.6 kg)   06/14/16 152 lb 8 oz (69.2 kg)         Vitals:    10/26/16 1532   BP: 120/82   BP Location: Right arm   Patient Position: Sitting   BP Cuff Size: Large   Pulse: 81   Temp: 98.4 ??F (36.9 ??C)  TempSrc: Oral   SpO2: 99%   Weight: 153 lb 9.6 oz (69.7 kg)   Height: 5' 6 (1.676 m)     Body mass index is 24.79 kg/m??.  Body surface area is 1.8 meters squared.    NAD  Soft wheeze R base only  Psych: A&Ox3; reactive affect; no obv thought d/o       Lab Results   Component Value Date    WBC 12.6 (H) 09/12/2015    HGB 13.0 09/12/2015    HCT 40.0 09/12/2015    MCV 86.4 09/12/2015    PLT 248 09/12/2015       No results found for: HGBA1C  No components found for: GLUF,  MICROALBUR,  LDLCALC,  CREATININE    Lab Results   Component Value Date    GLUCOSE 100 09/12/2015       No results found for: LIPIDCOMM       Immunization History   Administered Date(s) Administered   ??? Influenza Preservative Free Quadrivalent 08/06/2015, 06/25/2016   ??? Pneumococcal Polysaccharide 10/04/2008   ??? influenza unspec 05/31/2008     Health Maintenance   Topic Date Due   ??? Diabetes Screening  02/16/69   ??? Comprehensive Physical Exam  Jul 27, 1971   ??? Alcohol Misuse Screening  06/19/1987   ??? HIV Screening  06/19/1987   ??? Depression Screening  06/19/1987   ??? Immunization: DTaP/Tdap/Td (1 - Tdap) 06/18/1988   ??? Lipid Panel  06/18/2014   ??? Cervical Cancer Screening/Pap Smear (MyChart)  06/04/2017   ??? Immunization: Influenza  Completed   ??? Immunization: Pneumococcal  Completed   ??? Pulmonary Function Testing   Completed           Assessment/Plan:     Time in: 345  Time out:  401    16 mins, Greater than 50% of time spent in face to face counselling/coordination of care      A/P:  1. ADD- oarrs report:  no problems; uds done in the past: no red flags;  IR adderall: cont w 20-40mg /d  2. Asthma: mod persistent w recent mild exacn; needs refill of advair; prednisone of peak flows @ home < 50% goal    RTC: 3 months    New meds:   none  Patient education given  none  Patient verbalized understanding & agreement of the proposed plan.

## 2016-10-26 NOTE — Unmapped (Signed)
Health Maintenance   Topic Date Due   ??? Diabetes Screening  [birth year]17/1969   ??? Comprehensive Physical Exam  1971/10/03   ??? Alcohol Misuse Screening  06/19/1987   ??? HIV Screening  06/19/1987   ??? Depression Screening  06/19/1987   ??? Immunization: DTaP/Tdap/Td (1 - Tdap) 06/18/1988   ??? Lipid Panel  06/18/2014   ??? Cervical Cancer Screening/Pap Smear (MyChart)  06/04/2017   ??? Immunization: Influenza  Completed   ??? Immunization: Pneumococcal  Completed   ??? Pulmonary Function Testing  Completed      No results found for: HGBA1C  Lab Results   Component Value Date    CREATININE 0.66 09/12/2015       Lab Results   Component Value Date    GLUCOSE 100 09/12/2015    BUN 8 09/12/2015    CO2 29 09/12/2015    CREATININE 0.66 09/12/2015    K 3.5 09/12/2015    NA 134 09/12/2015    CL 101 09/12/2015    CALCIUM 9.3 09/12/2015    ALBUMIN 3.9 09/12/2015    PROT 7.5 09/12/2015    ALKPHOS 55 09/12/2015    ALT 9 09/12/2015    AST 12 (L) 09/12/2015    BILITOT 1.1 09/12/2015       No results found for: LIPIDCOMM, CHOLTOT, TRIG, HDL, CHOLHDL, LDL

## 2016-12-06 ENCOUNTER — Ambulatory Visit: Admit: 2016-12-06 | Payer: PRIVATE HEALTH INSURANCE

## 2016-12-06 DIAGNOSIS — J40 Bronchitis, not specified as acute or chronic: Secondary | ICD-10-CM

## 2016-12-06 MED ORDER — azithromycin (ZITHROMAX) 250 MG tablet
250 | ORAL_TABLET | ORAL | 0 refills | Status: AC
Start: 2016-12-06 — End: 2017-02-10

## 2016-12-06 NOTE — Unmapped (Signed)
[facility] [place] [facility]  [facility]  [address]  [place] [address]    Name:  Mary [name] Date of Birth: 1971-01-20 (46 y.o.)   MRN: [id number]    Date of Service:  12/06/2016     Subjective:     Chief Complaint   Patient presents with   ??? Cough     daughter had flu B      History of Present Illness:  Mary [name] is a(n) 46 y.o. female here today for the following:   HPI     Chief Complaint   Patient presents with   ??? Cough     daughter had flu B        dtr has flu B  Diagnosed Saturday    Sick since Friday    Fever on Saturday    Now in chest    Sinus  pressure   headache   body aches   ear pain  Chest full    Cough occ   mucinex ongoing  Using albuterol inhaler and nebulizer  H/o asthma anxiety depression anemia stable  Past Medical History:   Diagnosis Date   ??? ADD (attention deficit disorder)    ??? Anxiety    ??? Asthma    ??? Cervical dysplasia    ??? Chronic back pain    ??? Depression    ??? Diverticulitis    ??? Environmental allergies    ??? Iron deficiency    ??? Varicella    ??? Vitamin D deficiency      Past Surgical History:   Procedure Laterality Date   ??? ARTHROSCOPY KNEE MENISECTOMY, MENISCAL REPAIR Right 2013    right   ??? AUGMENTATION BREAST ENDOSCOPIC  2001    [place], [place]  implant replacement due to left implant rupture    ??? AUGMENTATION MAMMAPLASTY  age 46-24    [place], [place]   ??? BIOPSY BREAST NEEDLE LOCALIZATION Right 06/02/2016    Procedure: RIGHT BREAST NEEDLE LOCALIZED EXCISIONAL BIOPSY OF 2 AREAS,   ;  Surgeon: Cristal Generous, MD;  Location: [facility] OR;  Service: General;  Laterality: Right;   ??? BREAST EXCISIONAL BIOPSY      atypical cells   ??? CERVICAL CONE BIOPSY     ??? ENDOMETRIAL BIOPSY      benign polyp   ??? HYSTEROSCOPY ABLATION ENDOMETRIAL N/A 06/02/2016    Procedure: HYSTEROSCOPIC RESECTION OF ENDOMETRIAL POLYP;  Surgeon: Raylene Everts;  Location: [facility] OR;  Service: Gynecology;  Laterality: N/A;   ??? uterine biopsy       x2     Social History     Social History   ??? Marital  status: Married     Spouse name: N/A   ??? Number of children: N/A   ??? Years of education: N/A     Occupational History   ??? Not on file.     Social History Main Topics   ??? Smoking status: Never Smoker   ??? Smokeless tobacco: Never Used      Comment: 02/24/2011; passive smoke exposure:no (06/18/2005)   ??? Alcohol use 1.2 oz/week     2 Glasses of wine per week      Comment: socially   ??? Drug use: No      Comment: 01/07/2011   ??? Sexual activity: Not on file     Other Topics Concern   ??? Caffeine Use No   ??? Occupational Exposure No   ???  Exercise Yes     maybe 2 days a week   ??? Seat Belt Yes     Social History Narrative    Currently not working. Lives in a newer home with husband and 2 kids, no known mold in the home, no pets at home, no occupational exposures.      Family History   Problem Relation Age of Onset   ??? Uterine Cancer Mother 71     uterine vs cervical    ??? Hypertension Father    ??? Asthma Father    ??? Asthma Other    ??? Allergies Other    ??? Bone Cancer Paternal Grandmother 73   ??? Melanoma Neg Hx    ??? Breast Cancer Neg Hx    ??? Ovarian cancer Neg Hx    ??? Colon Cancer Neg Hx    ??? Pancreatic Cancer Neg Hx    ??? Prostate Cancer Neg Hx        Current Outpatient Prescriptions   Medication Sig Dispense Refill   ??? acetaminophen (TYLENOL) 325 MG tablet Take 650 mg by mouth every 6 hours as needed.     ??? cetirizine (ZYRTEC) 1 mg/mL syrup Take 10 mLs (10 mg total) by mouth daily. 118 mL 12   ??? cholecalciferol, vitamin D3, (VITAMIN D3) 5,000 unit Tab Take by mouth.     ??? dextroamphetamine-amphetamine (ADDERALL) 10 mg Tab Take 1 tablet (10 mg total) by mouth 3 times a day for 30 days.   Earliest Fill Date: 11/24/16 90 tablet 0   ??? [START ON 12/20/2016] dextroamphetamine-amphetamine (ADDERALL) 10 mg Tab Take 1 tablet (10 mg total) by mouth 3 times a day for 30 days.   Earliest Fill Date: 12/20/16 90 tablet 0   ??? escitalopram oxalate (LEXAPRO) 10 MG tablet Take 1 tablet (10 mg total) by mouth daily. 90 tablet 3   ??? FERROUS SULFATE (IRON  ORAL) Take by mouth.     ??? fluticasone (FLONASE) 50 mcg/actuation nasal spray SHAKE LIQUID AND USE 1 TO 2 SPRAYS IN EACH NOSTRIL DAILY AS NEEDED 16 g 0   ??? fluticasone-salmeterol (ADVAIR DISKUS) 500-50 mcg/dose DsDv Inhale 1 puff into the lungs 2 times a day. 3 Inhaler 3   ??? melatonin 10 mg Cap Take by mouth.     ??? omeprazole (PRILOSEC) 40 MG capsule Take 1 capsule (40 mg total) by mouth every morning before breakfast. 30 capsule 12   ??? PROAIR HFA 90 mcg/actuation inhaler INHALE 1 TO 2 PUFFS INTO THE LUNGS EVERY 4 TO 6 HOURS AS NEEDED FOR WHEEZING 18 g 11     No current facility-administered medications for this visit.       Review of Systems   Constitutional: Negative for activity change, appetite change, fatigue, fever, weight gain and weight loss.   HENT: Positive for congestion, postnasal drip, rhinorrhea, sinus pressure, sore throat and trouble swallowing.    Respiratory: Positive for cough. Negative for chest tightness, shortness of breath and stridor.    Cardiovascular: Negative for chest pain, palpitations and leg swelling.   Gastrointestinal: Negative for abdominal pain, constipation, diarrhea, heartburn, nausea and vomiting.   Musculoskeletal: Negative for arthralgias, myalgias, neck pain and neck stiffness.   Skin: Negative for rash.   Neurological: Negative for dizziness, tremors, weakness, light-headedness and numbness.   Hematological: Negative for adenopathy.   Psychiatric/Behavioral: Negative for dysphoric mood. The patient is not nervous/anxious.             Objective:  Vitals:    12/06/16 1130   BP: 102/60   Pulse: 90   Temp: 98 ??F (36.7 ??C)   TempSrc: Oral   SpO2: 96%   Weight: 156 lb (70.8 kg)     Body mass index is 25.18 kg/m??.    Physical Exam   Vitals reviewed.  Constitutional: She is oriented to person, place, and time. She appears well-developed and well-nourished.   Cardiovascular: Normal rate, regular rhythm, normal heart sounds and intact distal pulses.    No murmur  heard.  Pulmonary/Chest: Effort normal.   Decreased breath sounds posteriorly with  Improvement in air exchange  After treatment  With albuterol nebs   Abdominal: Soft. There is no tenderness.   Neurological: She is alert and oriented to person, place, and time. She has normal reflexes.   Psychiatric: She has a normal mood and affect. Her behavior is normal. Judgment and thought content normal.     Lab Results   Component Value Date    WBC 12.6 (H) 09/12/2015    HGB 13.0 09/12/2015    HCT 40.0 09/12/2015    MCV 86.4 09/12/2015    PLT 248 09/12/2015     No results found for: HGBA1C  No results found for: LIPIDCOMM, CHOLTOT, TRIG, HDL, CHOLHDL, LDL  Lab Results   Component Value Date    TSH 1.84 10/28/2009     No results found for: [facility]  Lab Results   Component Value Date    GLUCOSE 100 09/12/2015    BUN 8 09/12/2015    CO2 29 09/12/2015    CREATININE 0.66 09/12/2015    K 3.5 09/12/2015    NA 134 09/12/2015    CL 101 09/12/2015    CALCIUM 9.3 09/12/2015    ALBUMIN 3.9 09/12/2015    PROT 7.5 09/12/2015    ALKPHOS 55 09/12/2015    ALT 9 09/12/2015    AST 12 (L) 09/12/2015    BILITOT 1.1 09/12/2015            Assessment/Plan:     Cough  Bronchitis  asthma  See pt instructions    The patient understands his/her medication(s).  Side effects/barriers of these medications were addressed.  Over-the-counter medications were also addressed during this encounter.                    Veatrice Kells, MD

## 2016-12-06 NOTE — Patient Instructions (Signed)
zpak    Fluids   albuterol nebs every 4 hrs     continue advair     mucinex dm cough meds    Call if no bette rin 1 week or  sooner for worsening symptoms

## 2016-12-07 ENCOUNTER — Inpatient Hospital Stay: Admit: 2016-12-07 | Payer: PRIVATE HEALTH INSURANCE

## 2016-12-07 DIAGNOSIS — R0600 Dyspnea, unspecified: Secondary | ICD-10-CM

## 2016-12-07 MED ORDER — predniSONE (DELTASONE) 20 MG tablet
20 | ORAL_TABLET | Freq: Two times a day (BID) | ORAL | 0 refills | Status: AC
Start: 2016-12-07 — End: 2017-02-10

## 2016-12-07 NOTE — Telephone Encounter (Signed)
pls inform xray normal

## 2016-12-07 NOTE — Telephone Encounter (Signed)
Talked to pt feeling short of breath   talking albuterol every 2hrs and enougth to get her to alk t bathroom    Ordered cxr and predisone

## 2016-12-07 NOTE — Telephone Encounter (Signed)
Pt was in yesterday for an appointment and was given a breathing treatment to do at home and Dr. August Saucer said if it doesn't work to call the office and pts husband calling stating it is not helping and that she still is having a hard time breathing.

## 2016-12-08 NOTE — Telephone Encounter (Signed)
Pt informed per Dr. August Saucer

## 2016-12-13 ENCOUNTER — Ambulatory Visit: Admit: 2016-12-13 | Discharge: 2016-12-13 | Payer: PRIVATE HEALTH INSURANCE

## 2016-12-13 ENCOUNTER — Inpatient Hospital Stay: Admit: 2016-12-13 | Payer: PRIVATE HEALTH INSURANCE

## 2016-12-13 DIAGNOSIS — R222 Localized swelling, mass and lump, trunk: Secondary | ICD-10-CM

## 2016-12-13 DIAGNOSIS — Z48817 Encounter for surgical aftercare following surgery on the skin and subcutaneous tissue: Secondary | ICD-10-CM

## 2016-12-13 NOTE — Unmapped (Signed)
History of Present Illness:   Mary [name] is a 46 y.o. female who present for follow up for right breast atypia and a lesion along her lateral right chest wall. She feels that the mass is larger but isn't sure when it got larger as she hasn't felt it much. She realized just prior to her visit though that it seems larger now and there is an area of focal tenderness.    HPI          Histories:     She has a past medical history of ADD (attention deficit disorder); Anxiety; Asthma; Cervical dysplasia; Chronic back pain; Depression; Diverticulitis; Environmental allergies; Iron deficiency; Varicella; and Vitamin D deficiency.    She has a past surgical history that includes Cervical cone biopsy; arthroscopy knee menisectomy, meniscal repair (Right, 2013); augmentation breast endoscopic (2001); Augmentation mammaplasty (age 28-24); uterine biopsy ; biopsy breast needle localization (Right, 06/02/2016); hysteroscopy ablation endometrial (N/A, 06/02/2016); Breast excisional biopsy; and Endometrial biopsy.    Her family history includes Allergies in her other; Asthma in her father and other; Bone Cancer (age of onset: 62) in her paternal grandmother; Hypertension in her father; Uterine Cancer (age of onset: 25) in her mother.    She reports that she has never smoked. She has never used smokeless tobacco. She reports that she drinks about 1.2 oz of alcohol per week . She reports that she does not use drugs.      Review of Systems   Constitutional: Negative for chills, fatigue and fever.   HENT: Negative for congestion and sinus pressure.    Eyes: Negative for redness and itching.   Respiratory: Negative for cough and shortness of breath.    Cardiovascular: Negative for chest pain, palpitations and leg swelling.   Gastrointestinal: Negative for abdominal pain, constipation, diarrhea, nausea and vomiting.   Musculoskeletal: Negative for back pain and joint swelling.   Skin: Negative for rash.   Neurological: Negative for  dizziness, light-headedness and headaches.   Hematological: Does not bruise/bleed easily.   Psychiatric/Behavioral: Negative for confusion. The patient is not nervous/anxious.        Allergies:   Patient has no known allergies.    Medications:     Outpatient Encounter Prescriptions as of 12/13/2016   Medication Sig Dispense Refill   ??? acetaminophen (TYLENOL) 325 MG tablet Take 650 mg by mouth every 6 hours as needed.     ??? cetirizine (ZYRTEC) 1 mg/mL syrup Take 10 mLs (10 mg total) by mouth daily. 118 mL 12   ??? cholecalciferol, vitamin D3, (VITAMIN D3) 5,000 unit Tab Take by mouth.     ??? dextroamphetamine-amphetamine (ADDERALL) 10 mg Tab Take 1 tablet (10 mg total) by mouth 3 times a day for 30 days.   Earliest Fill Date: 11/24/16 90 tablet 0   ??? [START ON 12/20/2016] dextroamphetamine-amphetamine (ADDERALL) 10 mg Tab Take 1 tablet (10 mg total) by mouth 3 times a day for 30 days.   Earliest Fill Date: 12/20/16 90 tablet 0   ??? escitalopram oxalate (LEXAPRO) 10 MG tablet Take 1 tablet (10 mg total) by mouth daily. 90 tablet 3   ??? FERROUS SULFATE (IRON ORAL) Take by mouth.     ??? fluticasone (FLONASE) 50 mcg/actuation nasal spray SHAKE LIQUID AND USE 1 TO 2 SPRAYS IN EACH NOSTRIL DAILY AS NEEDED 16 g 0   ??? fluticasone-salmeterol (ADVAIR DISKUS) 500-50 mcg/dose DsDv Inhale 1 puff into the lungs 2 times a day. 3 Inhaler 3   ???  melatonin 10 mg Cap Take by mouth.     ??? omeprazole (PRILOSEC) 40 MG capsule Take 1 capsule (40 mg total) by mouth every morning before breakfast. 30 capsule 12   ??? predniSONE (DELTASONE) 20 MG tablet Take 1 tablet (20 mg total) by mouth 2 times a day. 10 tablet 0   ??? PROAIR HFA 90 mcg/actuation inhaler INHALE 1 TO 2 PUFFS INTO THE LUNGS EVERY 4 TO 6 HOURS AS NEEDED FOR WHEEZING 18 g 11   ??? azithromycin (ZITHROMAX) 250 MG tablet Take TWO 250 mg tablets by mouth on the first day and then ONE 250 mg tablet once daily by mouth for the next four days. 6 tablet 0     No facility-administered encounter  medications on file as of 12/13/2016.         Objective:       Weight 157 lb (71.2 kg).    Physical Exam   Nursing note and vitals reviewed.  Constitutional: She is oriented to person, place, and time. She appears well-developed and well-nourished.   HENT:   Head: Normocephalic and atraumatic.   Eyes: Conjunctivae and EOM are normal. Pupils are equal, round, and reactive to light.   Cardiovascular: Normal rate, regular rhythm and normal heart sounds.    No murmur heard.  Pulmonary/Chest: Effort normal and breath sounds normal. She has no wheezes. Right breast exhibits no inverted nipple, no nipple discharge, no skin change and no tenderness. Left breast exhibits no inverted nipple, no mass, no nipple discharge, no skin change and no tenderness. Breasts are symmetrical.       Abdominal: Soft. Bowel sounds are normal.   Musculoskeletal: Normal range of motion.   Neurological: She is alert and oriented to person, place, and time.   Skin: Skin is warm.   Psychiatric: She has a normal mood and affect. Her behavior is normal.       Review of Lab Results:      Lab Results   Component Value Date    WBC 12.6 (H) 09/12/2015    HGB 13.0 09/12/2015    HCT 40.0 09/12/2015    PLT 248 09/12/2015    GLUCOSE 100 09/12/2015    CREATININE 0.66 09/12/2015    NA 134 09/12/2015    K 3.5 09/12/2015    CL 101 09/12/2015    CO2 29 09/12/2015    BILITOT 1.1 09/12/2015    PROT 7.5 09/12/2015    AST 12 (L) 09/12/2015    ALT 9 09/12/2015    ALKPHOS 55 09/12/2015       Imaging:       Pathology:       Cancer Staging:   Cancer Staging  No matching staging information was found for the patient.       Assessment:   46 y.o. female with a history of right breast atypia and a right lateral chest wall mass that has gotten larger and is now tender    Plan:   With regards to the atypia, Mary [name] did see [facility] Wang/Dr. Neetu and considered tamoxifen for risk reduction but decided not to take it. Imaging from today reviewed, due for bilateral screening  mammograms in 04/2017.    For the mass along her lateral chest wall, it is clearly mor distinct on exam. What she feels is much larger than what I feel on exam. As the ultrasound showed a likely lipoma, will send for further imaging to delineate the extent of the lesion. Discussed presentation with  Dr. Manson Passey who recommended a chest wall MRI rather than a chest CT.     Will determine follow up pending MRI results.    I have reviewed and agree with her review of systems, past medical/surgical history/family history, social history, allergies, and medications as documented.

## 2016-12-13 NOTE — Unmapped (Signed)
Ms. Swander is a 45y.o. 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 She underwent needle localized excisional biopsy on 06/02/16. She saw Dr. Graylon Good, but declined risk reducing medications.   Patient states she has a mass on her right breast which has gotten larger since the last time she was here. She does experience occasional sharp pains in the area of the mass. She denies any other breast concerns.

## 2016-12-13 NOTE — Unmapped (Addendum)
1.  MRI of chest  Scheduling ph# [phone] ([phone])      2.  Follow up pending imaging results.    Dr. Lucille Passy staff contact information:     Dennie Fetters  Administrative Assistant    Salley Hews, RN  Nurse Clinician   Ph# [phone]    Main Office Ph# [phone] (24 hour on call line)  Fax# [phone]  Hours of Operation  Monday through Friday  8 am to 5 pm

## 2016-12-15 NOTE — Unmapped (Signed)
PT calling to speak to [facility] re:MRI.

## 2016-12-16 NOTE — Telephone Encounter (Signed)
Patient will call to schedule her chest MRI.  She called to confirm a phone # she was given to schedule.

## 2016-12-23 ENCOUNTER — Inpatient Hospital Stay: Admit: 2016-12-23 | Payer: PRIVATE HEALTH INSURANCE

## 2016-12-23 DIAGNOSIS — R222 Localized swelling, mass and lump, trunk: Secondary | ICD-10-CM

## 2016-12-23 MED ORDER — GADAVIST (gadobutrol) Soln 7 mL
1 | Freq: Once | INTRAVENOUS | Status: AC | PRN
Start: 2016-12-23 — End: 2016-12-23
  Administered 2016-12-23: 14:00:00 7 mL/kg via INTRAVENOUS

## 2016-12-24 NOTE — Telephone Encounter (Signed)
Spoke with patient and reviewed MRI results. Patient verbalized understanding but is concerned about what the next steps should be since the mass is growing. Will discuss with Dr. Melvyn Neth and call patient back with recommendations on Monday.

## 2016-12-24 NOTE — Unmapped (Signed)
Spoke with patient and informed her Dr. Melvyn Neth would recommend a referral to plastic surgery for further evaluation as the area may be her implant capsule. Patient declined referral to plastics at this time.

## 2017-02-03 ENCOUNTER — Ambulatory Visit: Admit: 2017-02-03 | Discharge: 2017-02-03 | Payer: PRIVATE HEALTH INSURANCE | Attending: Family

## 2017-02-03 DIAGNOSIS — L820 Inflamed seborrheic keratosis: Secondary | ICD-10-CM

## 2017-02-03 NOTE — Unmapped (Signed)
Chief Complaint   Patient presents with   ??? Skin Lesion     pt is here for lesions on rt hand x months       HPI: Pt is 46 y.o.whitefemale with red and tan scaly lesion on rt hand x months. Denies any changes. No mod factors. No treatments.      Review of Systems   Skin as above.  Hematological: Does not bruise/bleed easily.   Allergies reviewed     Derm History:LOV: 04/15/16  (- ) Family history of melanoma  (- ) Personal history of NMSC or MM  (+ ) sunburns easily  (+ ) uses sunscreen  (+ ) history of tanning bed use    Physical Examination:Limited    Examination was performed of the following: scalp/hair, head/face, conjunctivae/eyelids,  RUE, LUE,     Abnormalities noted include: RUE    1. Rt dorsal hand with 4mm tan/pink scaly stuck on papule    A & P:     1. ISK  Plan:  -Will treat with LN2 today in office to prevent worsening of condition.  LN2 x 1  to the skin lesion and should expect blistering or scabbing reaction. Do not pick at the area. Patient reminded to expect hypopigmented scars from the procedure. Return if lesion fails to fully resolve. Apply vaseline until area resolves (about 1 week)

## 2017-02-10 ENCOUNTER — Ambulatory Visit: Admit: 2017-02-10 | Payer: PRIVATE HEALTH INSURANCE

## 2017-02-10 DIAGNOSIS — F988 Other specified behavioral and emotional disorders with onset usually occurring in childhood and adolescence: Secondary | ICD-10-CM

## 2017-02-10 MED ORDER — predniSONE (DELTASONE) 20 MG tablet
20 | ORAL_TABLET | Freq: Two times a day (BID) | ORAL | 0 refills | Status: AC
Start: 2017-02-10 — End: 2018-04-28

## 2017-02-10 MED ORDER — omeprazole (PRILOSEC) 40 MG capsule
40 | ORAL_CAPSULE | Freq: Every day | ORAL | 3 refills | Status: AC
Start: 2017-02-10 — End: 2018-02-10

## 2017-02-10 MED ORDER — dextroamphetamine-amphetamine (ADDERALL) 10 mg Tab
10 | ORAL_TABLET | Freq: Every day | ORAL | 0 refills | Status: AC | PRN
Start: 2017-02-10 — End: 2017-03-08

## 2017-02-10 MED ORDER — dextroamphetamine-amphetamine (ADDERALL XR) 20 MG 24 hr capsule
20 | ORAL_CAPSULE | Freq: Every morning | ORAL | 0 refills | Status: AC
Start: 2017-02-10 — End: 2017-03-08

## 2017-02-10 NOTE — Unmapped (Signed)
Subjective   HPI:   Patient ID: Mary [name] is a 46 y.o. female.    Chief Complaint:  HPI         The patient's chart was reviewed prior to the visit as part of pre-visit planning  Chief Complaint   Patient presents with   ??? Medication Refill      Problems:fu ADD, asthma    Doing well c/w meds : takes adderall  10mg  2-4x/d, on average 20-30mg , but I feel like I cant focus, i'm a hot mess  Denies any s/e  Needs refills: in the past taking an XR in the morning and the short acting prn in the afternoon did work better  Denies diversion or st/drug abuse    Asthma: c/w meds, hasnt had to take prednisone in several months, albuterol more frequently recently   Does have some GERD, prn PPI helpful   No allergy eval   [facility]      Past Medical History:   Diagnosis Date   ??? ADD (attention deficit disorder)    ??? Anxiety    ??? Asthma    ??? Cervical dysplasia    ??? Chronic back pain    ??? Depression    ??? Diverticulitis    ??? Environmental allergies    ??? Iron deficiency    ??? Varicella    ??? Vitamin D deficiency        Current Outpatient Prescriptions   Medication Sig Dispense Refill   ??? acetaminophen (TYLENOL) 325 MG tablet Take 650 mg by mouth every 6 hours as needed.     ??? cetirizine (ZYRTEC) 1 mg/mL syrup Take 10 mLs (10 mg total) by mouth daily. 118 mL 12   ??? cholecalciferol, vitamin D3, (VITAMIN D3) 5,000 unit Tab Take by mouth.     ??? escitalopram oxalate (LEXAPRO) 10 MG tablet Take 1 tablet (10 mg total) by mouth daily. 90 tablet 3   ??? FERROUS SULFATE (IRON ORAL) Take by mouth.     ??? fluticasone (FLONASE) 50 mcg/actuation nasal spray SHAKE LIQUID AND USE 1 TO 2 SPRAYS IN EACH NOSTRIL DAILY AS NEEDED 16 g 0   ??? fluticasone-salmeterol (ADVAIR DISKUS) 500-50 mcg/dose DsDv Inhale 1 puff into the lungs 2 times a day. 3 Inhaler 3   ??? melatonin 10 mg Cap Take by mouth.     ??? omeprazole (PRILOSEC) 40 MG capsule Take 1 capsule (40 mg total) by mouth every morning before breakfast. 30 capsule 12   ??? PROAIR HFA 90  mcg/actuation inhaler INHALE 1 TO 2 PUFFS INTO THE LUNGS EVERY 4 TO 6 HOURS AS NEEDED FOR WHEEZING 18 g 11   ??? dextroamphetamine-amphetamine (ADDERALL) 10 mg Tab Take 1 tablet (10 mg total) by mouth 3 times a day for 30 days.   Earliest Fill Date: 11/24/16 90 tablet 0   ??? dextroamphetamine-amphetamine (ADDERALL) 10 mg Tab Take 1 tablet (10 mg total) by mouth 3 times a day for 30 days.   Earliest Fill Date: 12/20/16 90 tablet 0   ??? predniSONE (DELTASONE) 20 MG tablet Take 1 tablet (20 mg total) by mouth 2 times a day. 10 tablet 0     No current facility-administered medications for this visit.           ROS:   Review of Systems  as above   Objective:   Physical Exam      Wt Readings from Last 3 Encounters:   02/10/17 158 lb 8 oz (71.9  kg)   12/23/16 150 lb (68 kg)   12/13/16 157 lb (71.2 kg)         Vitals:    02/10/17 1000   BP: 118/78   Pulse: 102   Resp: 18   SpO2: 98%   Weight: 158 lb 8 oz (71.9 kg)   Height: 5' 6 (1.676 m)     Body mass index is 25.58 kg/m??.  Body surface area is 1.83 meters squared.    NAD  LCTAB  Psych: A&Ox3; reactive affect; no obv thought d/o       Lab Results   Component Value Date    WBC 12.6 (H) 09/12/2015    HGB 13.0 09/12/2015    HCT 40.0 09/12/2015    MCV 86.4 09/12/2015    PLT 248 09/12/2015       No results found for: HGBA1C  No components found for: GLUF,  MICROALBUR,  LDLCALC,  CREATININE    Lab Results   Component Value Date    GLUCOSE 100 09/12/2015       No results found for: LIPIDCOMM       Immunization History   Administered Date(s) Administered   ??? Influenza Preservative Free Quadrivalent 08/06/2015, 06/25/2016   ??? Pneumococcal Polysaccharide 10/04/2008   ??? influenza unspec 05/31/2008     Health Maintenance   Topic Date Due   ??? Diabetes Screening  23-Aug-1971   ??? Comprehensive Physical Exam  08/[birth year]/69   ??? Alcohol Misuse Screening  06/19/1987   ??? HIV Screening  06/19/1987   ??? Depression Screening  06/19/1987   ??? Immunization: DTaP/Tdap/Td (1 - Tdap) 06/18/1988   ??? Lipid Panel   06/18/2014   ??? Cervical Cancer Screening/Pap Smear (MyChart)  06/04/2017   ??? Immunization: Influenza  Completed   ??? Immunization: Pneumococcal  Completed   ??? Pulmonary Function Testing  Completed           Assessment/Plan:     Time in: 1005  Time out:  1026    21 mins, Greater than 50% of time spent in face to face counselling/coordination of care      A/P:  1. ADD- sx arent as well controlled; oarrs report:  no problems; uds done in the past: no red flags;  Refill IR adderall 10mg /d only for now, trial w adding AXR 20mg  once daily  2. Asthma: mod persistent : sx not well controlled; saw [facility] in the pastl refer for allergy ; [facility] rx for prednisone ; refill prilosec as does help w asthma sx    RTC: 1 months    New meds:   none  Patient education given  none  Patient verbalized understanding & agreement of the proposed plan.

## 2017-03-07 MED ORDER — escitalopram oxalate (LEXAPRO) 10 MG tablet
10 | ORAL_TABLET | ORAL | 0 refills | Status: AC
Start: 2017-03-07 — End: 2017-08-05

## 2017-03-08 ENCOUNTER — Ambulatory Visit: Admit: 2017-03-08 | Payer: PRIVATE HEALTH INSURANCE

## 2017-03-08 DIAGNOSIS — F988 Other specified behavioral and emotional disorders with onset usually occurring in childhood and adolescence: Secondary | ICD-10-CM

## 2017-03-08 MED ORDER — dextroamphetamine-amphetamine (ADDERALL XR) 20 MG 24 hr capsule
20 | ORAL_CAPSULE | Freq: Every morning | ORAL | 0 refills | Status: AC
Start: 2017-03-08 — End: 2017-03-08

## 2017-03-08 MED ORDER — dextroamphetamine-amphetamine (ADDERALL) 10 mg Tab
10 | ORAL_TABLET | Freq: Every day | ORAL | 0 refills | Status: AC | PRN
Start: 2017-03-08 — End: 2017-04-28

## 2017-03-08 MED ORDER — dextroamphetamine-amphetamine (ADDERALL XR) 20 MG 24 hr capsule
20 | ORAL_CAPSULE | Freq: Every morning | ORAL | 0 refills | Status: AC
Start: 2017-03-08 — End: 2017-04-01

## 2017-03-08 NOTE — Unmapped (Signed)
Subjective   HPI:   Patient ID: Mary [name] is a 46 y.o. female.    Chief Complaint:  HPI         The patient's chart was reviewed prior to the visit as part of pre-visit planning  No chief complaint on file.     Problems:fu ADD, asthma    Doing well c/w meds : takes adderall  10mg  + last visit added AXR I;m not where I need to be, all over the place, did best w 15mg  tid w 10mg  prn  Denies any s/e  Denies diversion or st/drug abuse    Asthma: c/w meds, doing fairly well      Past Medical History:   Diagnosis Date   ??? ADD (attention deficit disorder)    ??? Anxiety    ??? Asthma    ??? Cervical dysplasia    ??? Chronic back pain    ??? Depression    ??? Diverticulitis    ??? Environmental allergies    ??? Iron deficiency    ??? Varicella    ??? Vitamin D deficiency        Current Outpatient Prescriptions   Medication Sig Dispense Refill   ??? acetaminophen (TYLENOL) 325 MG tablet Take 650 mg by mouth every 6 hours as needed.     ??? cetirizine (ZYRTEC) 1 mg/mL syrup Take 10 mLs (10 mg total) by mouth daily. 118 mL 12   ??? cholecalciferol, vitamin D3, (VITAMIN D3) 5,000 unit Tab Take by mouth.     ??? dextroamphetamine-amphetamine (ADDERALL XR) 20 MG 24 hr capsule Take 1 capsule (20 mg total) by mouth every morning for 30 days. 30 capsule 0   ??? dextroamphetamine-amphetamine (ADDERALL) 10 mg Tab Take 1 tablet (10 mg total) by mouth 3 times a day for 30 days.   Earliest Fill Date: 12/20/16 90 tablet 0   ??? dextroamphetamine-amphetamine (ADDERALL) 10 mg Tab Take 1 tablet (10 mg total) by mouth daily as needed for up to 30 days.   Earliest Fill Date: 02/10/17 30 tablet 0   ??? escitalopram oxalate (LEXAPRO) 10 MG tablet TAKE 1 TABLET(10 MG) BY MOUTH DAILY 90 tablet 0   ??? FERROUS SULFATE (IRON ORAL) Take by mouth.     ??? fluticasone (FLONASE) 50 mcg/actuation nasal spray SHAKE LIQUID AND USE 1 TO 2 SPRAYS IN EACH NOSTRIL DAILY AS NEEDED 16 g 0   ??? fluticasone-salmeterol (ADVAIR DISKUS) 500-50 mcg/dose DsDv Inhale 1 puff into the lungs 2 times a  day. 3 Inhaler 3   ??? melatonin 10 mg Cap Take by mouth.     ??? omeprazole (PRILOSEC) 40 MG capsule Take 1 capsule (40 mg total) by mouth daily. 90 capsule 3   ??? predniSONE (DELTASONE) 20 MG tablet Take 1 tablet (20 mg total) by mouth 2 times a day. 10 tablet 0   ??? PROAIR HFA 90 mcg/actuation inhaler INHALE 1 TO 2 PUFFS INTO THE LUNGS EVERY 4 TO 6 HOURS AS NEEDED FOR WHEEZING 18 g 11     No current facility-administered medications for this visit.           ROS:   Review of Systems  as above   Objective:   Physical Exam      Wt Readings from Last 3 Encounters:   02/10/17 158 lb 8 oz (71.9 kg)   12/23/16 150 lb (68 kg)   12/13/16 157 lb (71.2 kg)         There were no vitals filed  for this visit.  There is no height or weight on file to calculate BMI.  There is no height or weight on file to calculate BSA.    NAD  LCTAB  Psych: A&Ox3; reactive affect; no obv thought d/o       Lab Results   Component Value Date    WBC 12.6 (H) 09/12/2015    HGB 13.0 09/12/2015    HCT 40.0 09/12/2015    MCV 86.4 09/12/2015    PLT 248 09/12/2015       No results found for: HGBA1C  No components found for: GLUF,  MICROALBUR,  LDLCALC,  CREATININE    Lab Results   Component Value Date    GLUCOSE 100 09/12/2015       No results found for: [facility]       Health Maintenance Summary                Diabetes Screening Overdue 10/20/1968     Comprehensive Physical Exam Overdue 04/16/69     Alcohol Misuse Screening Overdue 06/19/1987     HIV Screening Overdue 06/19/1987     Depression Screening Overdue 06/19/1987     Immunization: DTaP/Tdap/Td Overdue 06/18/1988     Lipid Panel Overdue 06/18/2014     Cervical Cancer Screening/Pap Smear (MyChart) Next Due 06/04/2017             Assessment/Plan:     Time in:   Time out:      21 mins, Greater than 50% of time spent in face to face counselling/coordination of care      A/P:  1. ADD- sx arent as well controlled; oarrs report:  no problems; uds done in the past: no red flags;  Refill IR adderall 10mg /d only  for now, trial w adding AXR 20mg  once daily  2. Asthma: mod persistent : sx not well controlled; saw [facility] in the pastl refer for allergy ; [facility] rx for prednisone ; refill prilosec as does help w asthma sx    RTC: 1 months    New meds:   none  Patient education given  none  Patient verbalized understanding & agreement of the proposed plan.

## 2017-03-08 NOTE — Unmapped (Signed)
Health Maintenance Summary                Diabetes Screening Overdue 1970/12[birth year]     Comprehensive Physical Exam Overdue 11-10-68     Alcohol Misuse Screening Overdue 06/19/1987     HIV Screening Overdue 06/19/1987     Depression Screening Overdue 06/19/1987     Immunization: DTaP/Tdap/Td Overdue 06/18/1988     Lipid Panel Overdue 06/18/2014     Cervical Cancer Screening/Pap Smear (MyChart) Next Due 06/04/2017

## 2017-04-01 MED ORDER — dextroamphetamineamphetamineADDERALLXR20MG24hrcapsule
20 | ORAL_CAPSULE | Freq: Every morning | ORAL | 0 refills | Status: AC
Start: 2017-04-01 — End: 2017-04-28

## 2017-04-01 NOTE — Unmapped (Signed)
Patient's med check appt was bumped.   Next appt scheduled 7/26    Patient states that she is stable, see refill request.

## 2017-04-28 ENCOUNTER — Ambulatory Visit: Admit: 2017-04-28 | Discharge: 2017-05-10 | Payer: PRIVATE HEALTH INSURANCE

## 2017-04-28 DIAGNOSIS — F988 Other specified behavioral and emotional disorders with onset usually occurring in childhood and adolescence: Secondary | ICD-10-CM

## 2017-04-28 MED ORDER — dextroamphetamine-amphetamine (ADDERALL XR) 20 MG 24 hr capsule
20 | ORAL_CAPSULE | Freq: Every morning | ORAL | 0 refills | Status: AC
Start: 2017-04-28 — End: 2017-08-05

## 2017-04-28 MED ORDER — dextroamphetamine-amphetamine (ADDERALL) 10 mg Tab
10 | ORAL_TABLET | Freq: Every day | ORAL | 0 refills | Status: AC | PRN
Start: 2017-04-28 — End: 2017-04-28

## 2017-04-28 MED ORDER — dextroamphetamine-amphetamine (ADDERALL) 10 mg Tab
10 | ORAL_TABLET | Freq: Every day | ORAL | 0 refills | Status: AC | PRN
Start: 2017-04-28 — End: 2017-08-05

## 2017-04-28 MED ORDER — dextroamphetamine-amphetamine (ADDERALL XR) 20 MG 24 hr capsule
20 | ORAL_CAPSULE | Freq: Every morning | ORAL | 0 refills | Status: AC
Start: 2017-04-28 — End: 2017-04-28

## 2017-04-28 NOTE — Unmapped (Signed)
Subjective   HPI:   Patient ID: Mary [name] is a 46 y.o. female.    Chief Complaint:  HPI         The patient's chart was reviewed prior to the visit as part of pre-visit planning  No chief complaint on file.     Problems:fu ADD, asthma    Doing well c/w meds :doing better w Refill IR adderall 10mg /d prn w AXR 40mg  once daily  Denies any s/e  Denies diversion or st/drug abuse      Past Medical History:   Diagnosis Date   ??? ADD (attention deficit disorder)    ??? Anxiety    ??? Asthma    ??? Cervical dysplasia    ??? Chronic back pain    ??? Depression    ??? Diverticulitis    ??? Environmental allergies    ??? Iron deficiency    ??? Varicella    ??? Vitamin D deficiency        Current Outpatient Prescriptions   Medication Sig Dispense Refill   ??? cetirizine (ZYRTEC) 10 MG tablet Take 10 mg by mouth daily as needed for Allergies.     ??? cholecalciferol, vitamin D3, (VITAMIN D3) 5,000 unit Tab Take by mouth.     ??? [START ON 06/27/2017] dextroamphetamine-amphetamine (ADDERALL XR) 20 MG 24 hr capsule Take 2 capsules (40 mg total) by mouth every morning for 30 days.   Earliest Fill Date: 06/27/17 60 capsule 0   ??? [START ON 06/27/2017] dextroamphetamine-amphetamine (ADDERALL) 10 mg Tab Take 1 tablet (10 mg total) by mouth daily as needed for up to 30 days.   Earliest Fill Date: 06/27/17 30 tablet 0   ??? escitalopram oxalate (LEXAPRO) 10 MG tablet TAKE 1 TABLET(10 MG) BY MOUTH DAILY 90 tablet 0   ??? FERROUS SULFATE (IRON ORAL) Take by mouth.     ??? fluticasone (FLONASE) 50 mcg/actuation nasal spray SHAKE LIQUID AND USE 1 TO 2 SPRAYS IN EACH NOSTRIL DAILY AS NEEDED 16 g 0   ??? fluticasone-salmeterol (ADVAIR DISKUS) 500-50 mcg/dose DsDv Inhale 1 puff into the lungs 2 times a day. 3 Inhaler 3   ??? melatonin 10 mg Cap Take by mouth.     ??? omeprazole (PRILOSEC) 40 MG capsule Take 1 capsule (40 mg total) by mouth daily. 90 capsule 3   ??? predniSONE (DELTASONE) 20 MG tablet Take 1 tablet (20 mg total) by mouth 2 times a day. 10 tablet 0   ??? PROAIR HFA 90  mcg/actuation inhaler INHALE 1 TO 2 PUFFS INTO THE LUNGS EVERY 4 TO 6 HOURS AS NEEDED FOR WHEEZING 18 g 11     No current facility-administered medications for this visit.           ROS:   Review of Systems  as above   Objective:   Physical Exam      Wt Readings from Last 3 Encounters:   04/28/17 157 lb (71.2 kg)   03/08/17 158 lb 6.4 oz (71.8 kg)   02/10/17 158 lb 8 oz (71.9 kg)         Vitals:    04/28/17 1422   BP: 118/84   Weight: 157 lb (71.2 kg)     Body mass index is 25.34 kg/m??.  Body surface area is 1.82 meters squared.    NAD  Psych: A&Ox3; reactive affect; no obv thought d/o       Lab Results   Component Value Date    WBC 12.6 (H) 09/12/2015  HGB 13.0 09/12/2015    HCT 40.0 09/12/2015    MCV 86.4 09/12/2015    PLT 248 09/12/2015       No results found for: HGBA1C  No components found for: GLUF,  MICROALBUR,  LDLCALC,  CREATININE    Lab Results   Component Value Date    GLUCOSE 100 09/12/2015       No results found for: [facility]       Health Maintenance Summary                Diabetes Screening Overdue [birth year]1969     Comprehensive Physical Exam Overdue 1971-07-29     Alcohol Misuse Screening Overdue 06/19/1987     HIV Screening Overdue 06/19/1987     Depression Screening Overdue 06/19/1987     Immunization: DTaP/Tdap/Td Overdue 06/18/1988     Lipid Panel Overdue 06/18/2014     Immunization: Influenza Next Due 05/04/2017     Cervical Cancer Screening/Pap Smear (MyChart) Next Due 06/04/2017             Assessment/Plan:     Time in: 220  Time out:  233    13 mins, Greater than 50% of time spent in face to face counselling/coordination of care      A/P:  1. ADD-doing better; continue with the current treatment ;  oarrs report:  no problems; uds done in the past: no red flags;      RTC: 3 months    New meds:   none  Patient education given  none  Patient verbalized understanding & agreement of the proposed plan.

## 2017-04-29 ENCOUNTER — Inpatient Hospital Stay: Admit: 2017-04-29 | Payer: PRIVATE HEALTH INSURANCE

## 2017-04-29 ENCOUNTER — Ambulatory Visit: Admit: 2017-04-29 | Discharge: 2017-04-29 | Payer: PRIVATE HEALTH INSURANCE

## 2017-04-29 DIAGNOSIS — Z1231 Encounter for screening mammogram for malignant neoplasm of breast: Secondary | ICD-10-CM

## 2017-04-29 DIAGNOSIS — N6091 Unspecified benign mammary dysplasia of right breast: Secondary | ICD-10-CM

## 2017-04-29 NOTE — Unmapped (Signed)
Dr. Jaime Lewis staff contact information:     Lacey Combs  Administrative Assistant    Ashley Walker, RN  Nurse Clinician   Ph# [phone]    Main Office Ph# [phone] (24 hour on call line)  Fax# [phone]  Hours of Operation  Monday through Friday  8 am to 5 pm

## 2017-04-29 NOTE — Unmapped (Signed)
Mary [name] is a 45y.o. 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  A MRI of the chest on 12/23/16 was unremarkable and she was instructed to follow up with her plastic surgeon to further evaluate the area as it may be related to her breast implant.     She declined risk reducing medications. Patient underwent screening mammogram prior to her appointment. She states she is doing well and denies any new breast changes or concerns.  She states the right chest wall mass is still present, not bothersome to her, but has grown some with her weight gain.

## 2017-04-29 NOTE — Unmapped (Signed)
History of Present Illness:   Mary [name] is a 46 y.o. female who presents for follow up for atypia as well as a right lateral chest wall mass.    HPI          Histories:     She has a past medical history of ADD (attention deficit disorder); Anxiety; Asthma; Cervical dysplasia; Chronic back pain; Depression; Diverticulitis; Environmental allergies; Iron deficiency; Varicella; and Vitamin D deficiency.    She has a past surgical history that includes Cervical cone biopsy; arthroscopy knee menisectomy, meniscal repair (Right, 2013); augmentation breast endoscopic (2001); Augmentation mammaplasty (age 83-24); uterine biopsy ; biopsy breast needle localization (Right, 06/02/2016); hysteroscopy ablation endometrial (N/A, 06/02/2016); Breast excisional biopsy; and Endometrial biopsy.    Her family history includes Allergies in her other; Asthma in her father and other; Bone Cancer (age of onset: 60) in her paternal grandmother; Hypertension in her father; Uterine Cancer (age of onset: 37) in her mother.    She reports that she has never smoked. She has never used smokeless tobacco. She reports that she drinks about 1.2 oz of alcohol per week . She reports that she does not use drugs.      Review of Systems   Constitutional: Negative for chills, fatigue and fever.   HENT: Negative for congestion and sinus pressure.    Eyes: Negative for redness and itching.   Respiratory: Negative for cough and shortness of breath.    Cardiovascular: Negative for chest pain, palpitations and leg swelling.   Gastrointestinal: Negative for abdominal pain, constipation, diarrhea, nausea and vomiting.   Genitourinary: Negative for dysuria and frequency.   Musculoskeletal: Negative for back pain and joint swelling.   Skin: Negative for rash.   Neurological: Negative for dizziness, light-headedness and headaches.   Hematological: Does not bruise/bleed easily.   Psychiatric/Behavioral: Negative for confusion. The patient is not  nervous/anxious.        Allergies:   Patient has no known allergies.    Medications:     Outpatient Encounter Prescriptions as of 04/29/2017   Medication Sig Dispense Refill   ??? cetirizine (ZYRTEC) 10 MG tablet Take 10 mg by mouth daily as needed for Allergies.     ??? cholecalciferol, vitamin D3, (VITAMIN D3) 5,000 unit Tab Take by mouth.     ??? [START ON 06/27/2017] dextroamphetamine-amphetamine (ADDERALL XR) 20 MG 24 hr capsule Take 2 capsules (40 mg total) by mouth every morning for 30 days.   Earliest Fill Date: 06/27/17 60 capsule 0   ??? [START ON 06/27/2017] dextroamphetamine-amphetamine (ADDERALL) 10 mg Tab Take 1 tablet (10 mg total) by mouth daily as needed for up to 30 days.   Earliest Fill Date: 06/27/17 30 tablet 0   ??? escitalopram oxalate (LEXAPRO) 10 MG tablet TAKE 1 TABLET(10 MG) BY MOUTH DAILY 90 tablet 0   ??? FERROUS SULFATE (IRON ORAL) Take by mouth.     ??? fluticasone (FLONASE) 50 mcg/actuation nasal spray SHAKE LIQUID AND USE 1 TO 2 SPRAYS IN EACH NOSTRIL DAILY AS NEEDED 16 g 0   ??? fluticasone-salmeterol (ADVAIR DISKUS) 500-50 mcg/dose DsDv Inhale 1 puff into the lungs 2 times a day. 3 Inhaler 3   ??? melatonin 10 mg Cap Take by mouth.     ??? omeprazole (PRILOSEC) 40 MG capsule Take 1 capsule (40 mg total) by mouth daily. 90 capsule 3   ??? predniSONE (DELTASONE) 20 MG tablet Take 1 tablet (20 mg total) by mouth 2 times a day. 10 tablet  0   ??? PROAIR HFA 90 mcg/actuation inhaler INHALE 1 TO 2 PUFFS INTO THE LUNGS EVERY 4 TO 6 HOURS AS NEEDED FOR WHEEZING 18 g 11   ??? [DISCONTINUED] dextroamphetamine-amphetamine (ADDERALL XR) 20 MG 24 hr capsule Take 2 capsules (40 mg total) by mouth every morning for 30 days.   Earliest Fill Date: 04/01/17 60 capsule 0   ??? [DISCONTINUED] dextroamphetamine-amphetamine (ADDERALL) 10 mg Tab Take 1 tablet (10 mg total) by mouth daily as needed for up to 30 days. 30 tablet 0     No facility-administered encounter medications on file as of 04/29/2017.         Objective:       Blood  pressure 118/86, pulse 85, height 5' 6 (1.676 m), weight 147 lb (66.7 kg), last menstrual period 04/01/2017, SpO2 97 %.    Physical Exam   Nursing note and vitals reviewed.  Constitutional: She is oriented to person, place, and time. She appears well-developed and well-nourished.   HENT:   Head: Normocephalic and atraumatic.   Eyes: Conjunctivae and EOM are normal. Pupils are equal, round, and reactive to light.   Cardiovascular: Normal rate, regular rhythm and normal heart sounds.    No murmur heard.  Pulmonary/Chest: Effort normal and breath sounds normal. She has no wheezes. Right breast exhibits no inverted nipple, no nipple discharge, no skin change and no tenderness. Left breast exhibits no inverted nipple, no mass, no nipple discharge, no skin change and no tenderness. Breasts are symmetrical.       Abdominal: Soft. Bowel sounds are normal.   Musculoskeletal: Normal range of motion.   Neurological: She is alert and oriented to person, place, and time.   Skin: Skin is warm.   Psychiatric: She has a normal mood and affect. Her behavior is normal.       Review of Lab Results:      Lab Results   Component Value Date    WBC 12.6 (H) 09/12/2015    HGB 13.0 09/12/2015    HCT 40.0 09/12/2015    PLT 248 09/12/2015    GLUCOSE 100 09/12/2015    CREATININE 0.66 09/12/2015    NA 134 09/12/2015    K 3.5 09/12/2015    CL 101 09/12/2015    CO2 29 09/12/2015    BILITOT 1.1 09/12/2015    PROT 7.5 09/12/2015    AST 12 (L) 09/12/2015    ALT 9 09/12/2015    ALKPHOS 55 09/12/2015       Imaging:       Pathology:       Cancer Staging:   Cancer Staging  No matching staging information was found for the patient.       Assessment:   46 y.o. 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 Recommended excision is the mass grows significantly.    Screening mammograms reviewed, results pending  and clinical exam otherwise unremarkable.    Pending mammogram results, follow up in 4-6 months and as needed for any new symptoms, breast changes, or concerns.    I have reviewed and agree with her review of systems, past medical/surgical history/family history, social history, allergies, and medications as documented.

## 2017-08-05 ENCOUNTER — Ambulatory Visit: Admit: 2017-08-05 | Payer: PRIVATE HEALTH INSURANCE

## 2017-08-05 DIAGNOSIS — F988 Other specified behavioral and emotional disorders with onset usually occurring in childhood and adolescence: Secondary | ICD-10-CM

## 2017-08-05 MED ORDER — dextroamphetamine-amphetamine (ADDERALL) 10 mg Tab
10 | ORAL_TABLET | Freq: Every day | ORAL | 0 refills | Status: AC | PRN
Start: 2017-08-05 — End: 2017-11-11

## 2017-08-05 MED ORDER — dextroamphetamine-amphetamine (ADDERALL XR) 20 MG 24 hr capsule
20 | ORAL_CAPSULE | Freq: Every morning | ORAL | 0 refills | Status: AC
Start: 2017-08-05 — End: 2017-08-05

## 2017-08-05 MED ORDER — dextroamphetamine-amphetamine (ADDERALL XR) 20 MG 24 hr capsule
20 | ORAL_CAPSULE | Freq: Every morning | ORAL | 0 refills | Status: AC
Start: 2017-08-05 — End: 2017-11-11

## 2017-08-05 MED ORDER — dextroamphetamine-amphetamine (ADDERALL) 10 mg Tab
10 | ORAL_TABLET | Freq: Every day | ORAL | 0 refills | Status: AC | PRN
Start: 2017-08-05 — End: 2017-08-05

## 2017-08-05 MED ORDER — escitalopram oxalate (LEXAPRO) 20 MG tablet
20 | ORAL_TABLET | Freq: Every day | ORAL | 3 refills | Status: AC
Start: 2017-08-05 — End: 2018-09-03

## 2017-08-05 NOTE — Progress Notes (Signed)
Subjective   HPI:   Patient ID: Mary [name] is a 46 y.o. female.    Chief Complaint:  HPI         The patient's chart was reviewed prior to the visit as part of pre-visit planning  Chief Complaint   Patient presents with    Medication Refill     RF needed for Adderall      Problems:fu ADD    Doing well c/w meds :doing better w Refill IR adderall 10mg /d prn (not every day but most days) w AXR 40mg  once daily  Denies any s/e  Does feel more anxious recently, denies depn  i'm not as active as I usually like to be  Denies diversion or st/drug abuse    Controlled Substance Monitoring 11/13/2015 09/09/2016 10/26/2016 02/10/2017 03/08/2017 04/28/2017 08/05/2017   OARRS/eKASPER Status Reviewed Reviewed Reviewed Reviewed Reviewed Reviewed Reviewed   OARRS/eKASPER Consistent with Prescriber Expectation [name]     I have completed the required OARRS/eKASPER documentation for this patient on 08/05/2017.  [name]      Past Medical History:   Diagnosis Date    ADD (attention deficit disorder)     Anxiety     Asthma     Cervical dysplasia     Chronic back pain     Depression     Diverticulitis     Environmental allergies     Iron deficiency     Varicella     Vitamin D deficiency        Current Outpatient Prescriptions   Medication Sig Dispense Refill    cetirizine (ZYRTEC) 10 MG tablet Take 10 mg by mouth daily as needed for Allergies.      cholecalciferol, vitamin D3, (VITAMIN D3) 5,000 unit Tab Take by mouth.      escitalopram oxalate (LEXAPRO) 10 MG tablet TAKE 1 TABLET(10 MG) BY MOUTH DAILY 90 tablet 0    FERROUS SULFATE (IRON ORAL) Take by mouth.      fluticasone (FLONASE) 50 mcg/actuation nasal spray SHAKE LIQUID AND USE 1 TO 2 SPRAYS IN EACH NOSTRIL DAILY AS NEEDED 16 g 0    fluticasone-salmeterol (ADVAIR DISKUS) 500-50 mcg/dose DsDv Inhale 1 puff into the lungs 2 times a day. 3 Inhaler 3    melatonin 10 mg Cap Take by mouth.      omeprazole (PRILOSEC) 40 MG capsule Take 1 capsule (40 mg total)  by mouth daily. 90 capsule 3    predniSONE (DELTASONE) 20 MG tablet Take 1 tablet (20 mg total) by mouth 2 times a day. 10 tablet 0    PROAIR HFA 90 mcg/actuation inhaler INHALE 1 TO 2 PUFFS INTO THE LUNGS EVERY 4 TO 6 HOURS AS NEEDED FOR WHEEZING 18 g 11    vit b complex w-b 12 tablet Take 1 tablet by mouth daily.      dextroamphetamine-amphetamine (ADDERALL XR) 20 MG 24 hr capsule Take 2 capsules (40 mg total) by mouth every morning for 30 days.   Earliest Fill Date: 06/27/17 60 capsule 0    dextroamphetamine-amphetamine (ADDERALL) 10 mg Tab Take 1 tablet (10 mg total) by mouth daily as needed for up to 30 days.   Earliest Fill Date: 06/27/17 30 tablet 0     No current facility-administered medications for this visit.           ROS:   Review of Systems  as above   Objective:   Physical Exam  Wt Readings from Last 3 Encounters:   08/05/17 154 lb (69.9 kg)   04/29/17 147 lb (66.7 kg)   04/28/17 157 lb (71.2 kg)         Vitals:    08/05/17 1634   BP: 130/80   Pulse: 100   Temp: 98.8 F (37.1 C)   SpO2: 98%   Weight: 154 lb (69.9 kg)   Height: 5' 6 (1.676 m)     Body mass index is 24.86 kg/m.  Body surface area is 1.8 meters squared.    NAD  Psych: A&Ox3; reactive affect; no obv thought d/o       Lab Results   Component Value Date    WBC 12.6 (H) 09/12/2015    HGB 13.0 09/12/2015    HCT 40.0 09/12/2015    MCV 86.4 09/12/2015    PLT 248 09/12/2015       No results found for: HGBA1C  No components found for: GLUF,  MICROALBUR,  LDLCALC,  CREATININE    Lab Results   Component Value Date    GLUCOSE 100 09/12/2015       No results found for: [place]       Health Maintenance Summary                Diabetes Screening Overdue 07-08-69     Comprehensive Physical Exam Overdue 12/30/1968     Alcohol Misuse Screening Overdue 06/19/1987     HIV Screening Overdue 06/19/1987     Depression Screening Overdue 06/19/1987     Immunization: DTaP/Tdap/Td Overdue 06/18/1988     Lipid Panel Overdue 06/18/2014     Cervical Cancer  Screening/Pap Smear (MyChart) Overdue 06/04/2017     Immunization: Influenza Overdue 07/04/2017             Assessment/Plan:     Time in: 449  Time out:  459    10 mins, Greater than 50% of time spent in face to face counselling/coordination of care      A/P:  1. ADD-doing better; continue with the current treatment ;  oarrs report:  no problems; uds done in the past: no red flags;    2. Asthma: overdue for pulm fu  3. GAD: a little worse; trial w inc lexapro to 20mg     RTC: 3 months    New meds:   none  Patient education given  none  Patient verbalized understanding & agreement of the proposed plan.

## 2017-08-05 NOTE — Unmapped (Addendum)
Please call to schedule your appointment:    [facility]: [phone]

## 2017-09-02 ENCOUNTER — Ambulatory Visit: Admit: 2017-09-02 | Discharge: 2017-09-02 | Payer: PRIVATE HEALTH INSURANCE

## 2017-09-02 DIAGNOSIS — R222 Localized swelling, mass and lump, trunk: Secondary | ICD-10-CM

## 2017-09-02 NOTE — Progress Notes (Signed)
History of Present Illness:   Mary [name] is a 46 y.o. female who present for follow up for atypia and a right lateral chest wall mass. She denies any changes or new concerns.    HPI          Histories:     She has a past medical history of ADD (attention deficit disorder); Anxiety; Asthma; Cervical dysplasia; Chronic back pain; Depression; Diverticulitis; Environmental allergies; Iron deficiency; Varicella; and Vitamin D deficiency.    She has a past surgical history that includes Cervical cone biopsy; arthroscopy knee menisectomy, meniscal repair (Right, 2013); augmentation breast endoscopic (2001); Augmentation mammaplasty (age 44-24); uterine biopsy ; biopsy breast needle localization (Right, 06/02/2016); hysteroscopy ablation endometrial (N/A, 06/02/2016); Breast excisional biopsy; and Endometrial biopsy.    Her family history includes Allergies in her other; Asthma in her father and other; Bone Cancer (age of onset: 63) in her paternal grandmother; Hypertension in her father; Uterine Cancer (age of onset: 89) in her mother.    She reports that she has never smoked. She has never used smokeless tobacco. She reports that she drinks about 1.2 oz of alcohol per week . She reports that she does not use drugs.      Review of Systems   Constitutional: Negative for appetite change, chills, fatigue and fever.   HENT: Negative for congestion.    Eyes: Negative for visual disturbance.   Respiratory: Negative for cough, chest tightness and shortness of breath.    Cardiovascular: Negative for chest pain and leg swelling.   Gastrointestinal: Negative for abdominal pain, bloating, constipation, diarrhea, heartburn and nausea.   Genitourinary: Negative for difficulty urinating.   Musculoskeletal: Negative for back pain, joint swelling, myalgias and neck pain.   Skin: Negative for color change and rash.   Neurological: Negative for dizziness, light-headedness, numbness and headaches.   Hematological: Bruises/bleeds  easily.   Psychiatric/Behavioral: Negative for confusion and depression. The patient is not nervous/anxious.        Allergies:   Patient has no known allergies.    Medications:     Outpatient Encounter Prescriptions as of 09/02/2017   Medication Sig Dispense Refill    cetirizine (ZYRTEC) 10 MG tablet Take 10 mg by mouth daily as needed for Allergies.      cholecalciferol, vitamin D3, (VITAMIN D3) 5,000 unit Tab Take by mouth.      [START ON 10/04/2017] dextroamphetamine-amphetamine (ADDERALL XR) 20 MG 24 hr capsule Take 2 capsules (40 mg total) by mouth every morning for 30 days.   Earliest Fill Date: 10/04/17 60 capsule 0    [START ON 10/04/2017] dextroamphetamine-amphetamine (ADDERALL) 10 mg Tab Take 1 tablet (10 mg total) by mouth daily as needed for up to 30 days.   Earliest Fill Date: 10/04/17 30 tablet 0    escitalopram oxalate (LEXAPRO) 20 MG tablet Take 1 tablet (20 mg total) by mouth daily. 90 tablet 3    FERROUS SULFATE (IRON ORAL) Take by mouth.      fluticasone (FLONASE) 50 mcg/actuation nasal spray SHAKE LIQUID AND USE 1 TO 2 SPRAYS IN EACH NOSTRIL DAILY AS NEEDED 16 g 0    fluticasone-salmeterol (ADVAIR DISKUS) 500-50 mcg/dose DsDv Inhale 1 puff into the lungs 2 times a day. 3 Inhaler 3    melatonin 10 mg Cap Take by mouth.      omeprazole (PRILOSEC) 40 MG capsule Take 1 capsule (40 mg total) by mouth daily. 90 capsule 3    predniSONE (DELTASONE) 20 MG tablet Take  1 tablet (20 mg total) by mouth 2 times a day. 10 tablet 0    PROAIR HFA 90 mcg/actuation inhaler INHALE 1 TO 2 PUFFS INTO THE LUNGS EVERY 4 TO 6 HOURS AS NEEDED FOR WHEEZING 18 g 11    vit b complex w-b 12 tablet Take 1 tablet by mouth daily.       No facility-administered encounter medications on file as of 09/02/2017.         Objective:       Blood pressure 122/68, pulse 80, height 5' 6 (1.676 m), weight 156 lb 9.6 oz (71 kg), last menstrual period 08/16/2017, SpO2 96 %.    Physical Exam   Nursing note and vitals  reviewed.  Constitutional: She is oriented to person, place, and time. She appears well-developed and well-nourished.   HENT:   Head: Normocephalic and atraumatic.   Eyes: Pupils are equal, round, and reactive to light. Conjunctivae and EOM are normal.   Cardiovascular: Normal rate and normal heart sounds.    No murmur heard.  Pulmonary/Chest: Effort normal. She has no wheezes. Right breast exhibits skin change (incision healing well without surrounding erythema or purulence). Right breast exhibits no inverted nipple, no nipple discharge and no tenderness. Left breast exhibits no inverted nipple, no mass, no nipple discharge, no skin change and no tenderness. Breasts are symmetrical.       Abdominal: Soft. Bowel sounds are normal.   Musculoskeletal: Normal range of motion.   Neurological: She is alert and oriented to person, place, and time.   Skin: Skin is warm.   Psychiatric: She has a normal mood and affect. Her behavior is normal.       Review of Lab Results:      Lab Results   Component Value Date    WBC 12.6 (H) 09/12/2015    HGB 13.0 09/12/2015    HCT 40.0 09/12/2015    PLT 248 09/12/2015    GLUCOSE 100 09/12/2015    CREATININE 0.66 09/12/2015    NA 134 09/12/2015    K 3.5 09/12/2015    CL 101 09/12/2015    CO2 29 09/12/2015    BILITOT 1.1 09/12/2015    PROT 7.5 09/12/2015    AST 12 (L) 09/12/2015    ALT 9 09/12/2015    ALKPHOS 55 09/12/2015       Imaging:       Pathology:       Cancer Staging:   Cancer Staging  No matching staging information was found for the patient.       Assessment:   46 y.o. female with a history of right breast atypia and a right lateral chest wall mass that is now stable in size    Plan:   Exam stable. No new imaging to review.    Follow up in 04/2018 with screening mammograms and as needed for any new symptoms, breast changes, or concerns.    I have reviewed and agree with her review of systems, past medical/surgical history/family history, social history, allergies, and medications  as documented.

## 2017-09-02 NOTE — Unmapped (Signed)
Mary [name] is a 46y.o. 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 She is due for mammograms in July. Patient states she is doing well and the chest wall mass is unchanged. She denies any concerns.

## 2017-09-02 NOTE — Unmapped (Addendum)
Follow up due: 05/01/18 @ 9:50am with a bilateral screening mammogram  Imaging needed prior to appointment:     Dr. Lucille Passy staff contact information:     Tresea Mall  Administrative Assistant    Salley Hews, RN  Nurse Clinician   Ph# [phone]    Main Office Ph# [phone] (24 hour on call line)  Fax# [phone]  Hours of Operation  Monday through Friday  8 am to 5 pm

## 2017-09-30 ENCOUNTER — Emergency Department: Admit: 2017-09-30 | Payer: PRIVATE HEALTH INSURANCE

## 2017-09-30 ENCOUNTER — Inpatient Hospital Stay
Admit: 2017-09-30 | Discharge: 2017-10-01 | Disposition: A | Discharge status: Home / Self Care | Payer: PRIVATE HEALTH INSURANCE

## 2017-09-30 DIAGNOSIS — G8918 Other acute postprocedural pain: Secondary | ICD-10-CM

## 2017-09-30 LAB — BASIC METABOLIC PANEL
Anion Gap: 10 mmol/L (ref 3–16)
BUN: 7 mg/dL (ref 7–25)
CO2: 25 mmol/L (ref 21–33)
Calcium: 9.1 mg/dL (ref 8.6–10.3)
Chloride: 100 mmol/L (ref 98–110)
Creatinine: 0.71 mg/dL (ref 0.60–1.30)
Glucose: 107 mg/dL (ref 70–100)
Osmolality, Calculated: 278 mOsm/kg (ref 278–305)
Potassium: 3.7 mmol/L (ref 3.5–5.3)
Sodium: 135 mmol/L (ref 133–146)
eGFR AA CKD-EPI: >90 See note.
eGFR NONAA CKD-EPI: >90 See note.

## 2017-09-30 LAB — URINALYSIS W/RFL MICROSCOP, RFL CULTURE
Bilirubin, UA: NEGATIVE
Glucose, UA: NEGATIVE mg/dL
Ketones, UA: NEGATIVE mg/dL
Leukocyte Esterase, UA: NEGATIVE
Nitrite, UA: NEGATIVE
Protein, UA: NEGATIVE mg/dL
RBC, UA: 1 /HPF (ref 0–3)
Specific Gravity, UA: 1.008 (ref 1.005–1.035)
Squam Epithel, UA: <1 /HPF (ref 0–5)
Urobilinogen, UA: <2 mg/dL (ref 0.2–1.9)
WBC, UA: 1 /HPF (ref 0–5)
pH, UA: 7 (ref 5.0–8.0)

## 2017-09-30 LAB — HEPATIC FUNCTION PANEL
ALT: 23 U/L (ref 7–52)
AST: 30 U/L (ref 13–39)
Albumin: 4 g/dL (ref 3.5–5.7)
Alkaline Phosphatase: 55 U/L (ref 36–125)
Bilirubin, Direct: 0.1 mg/dL (ref 0.0–0.4)
Bilirubin, Indirect: 0.4 mg/dL (ref 0.0–1.1)
Total Bilirubin: 0.5 mg/dL (ref 0.0–1.5)
Total Protein: 7.3 g/dL (ref 6.4–8.9)

## 2017-09-30 LAB — CBC
Hematocrit: 37.3 % (ref 35.0–45.0)
Hemoglobin: 12.3 g/dL (ref 11.7–15.5)
MCH: 29.1 pg (ref 27.0–33.0)
MCHC: 32.8 g/dL (ref 32.0–36.0)
MCV: 88.7 fL (ref 80.0–100.0)
MPV: 7.9 fL (ref 7.5–11.5)
Platelets: 229 10*3/uL (ref 140–400)
RBC: 4.21 10*6/uL (ref 3.80–5.10)
RDW: 13.2 % (ref 11.0–15.0)
WBC: 7.9 10*3/uL (ref 3.8–10.8)

## 2017-09-30 LAB — DIFFERENTIAL
Basophils Absolute: 8 /uL (ref 0–200)
Basophils Relative: 0.1 % (ref 0.0–1.0)
Eosinophils Absolute: 182 /uL (ref 15–500)
Eosinophils Relative: 2.3 % (ref 0.0–8.0)
Lymphocytes Absolute: 640 /uL (ref 850–3900)
Lymphocytes Relative: 8.1 % (ref 15.0–45.0)
Monocytes Absolute: 332 /uL (ref 200–950)
Monocytes Relative: 4.2 % (ref 0.0–12.0)
Neutrophils Absolute: 6739 /uL (ref 1500–7800)
Neutrophils Relative: 85.3 % (ref 40.0–80.0)

## 2017-09-30 LAB — VENOUS BLOOD GAS, LINE/SYRINGE
%HBO2-Line Draw: 49.8 % (ref 40.0–70.0)
Base Excess-Line Draw: 2.3 mmol/L (ref <=3.0)
CO2 Content-Line Draw: 29 mmol/L (ref 25–29)
Carboxyhgb-Line Draw: 1.1 %
HCO3-Line Draw: 28 mmol/L (ref 24–28)
Methemoglobin-Line Draw: 1 % (ref 0.0–1.5)
PCO2-Line Draw: 46 mmHg (ref 41–51)
PH-Line Draw: 7.39 (ref 7.32–7.42)
PO2-Line Draw: 28 mmHg (ref 25–40)
Reduced Hemoglobin-Line Draw: 48.1 % — ABNORMAL HIGH (ref 0.0–5.0)

## 2017-09-30 LAB — LIPASE: Lipase: 7 U/L (ref 4–82)

## 2017-09-30 LAB — LACTIC ACID: Lactate: 1 mmol/L (ref 0.5–2.2)

## 2017-09-30 MED ORDER — morphine injection 4 mg
4 | Freq: Once | INTRAVENOUS | Status: AC
Start: 2017-09-30 — End: 2017-09-30
  Administered 2017-09-30: 21:00:00 4 mg via INTRAVENOUS

## 2017-09-30 MED ORDER — lactated Ringers 1,000 mL IV fluid
Freq: Once | INTRAVENOUS | Status: AC
Start: 2017-09-30 — End: 2017-09-30
  Administered 2017-09-30: 21:00:00 1000 mL via INTRAVENOUS

## 2017-09-30 MED ORDER — oxyCODONE-acetaminophen (PERCOCET) 5-325 mg per tablet
5-325 | ORAL_TABLET | Freq: Four times a day (QID) | ORAL | 0 refills | Status: AC | PRN
Start: 2017-09-30 — End: 2017-10-07

## 2017-09-30 MED ORDER — OMNIPAQUE (iohexol) 350 mg iodine/mL 125 mL
350 | Freq: Once | INTRAVENOUS | Status: AC | PRN
Start: 2017-09-30 — End: 2017-09-30

## 2017-09-30 MED ORDER — OMNIPAQUE (iohexol) 350 mg iodine/mL 125 mL
350 | Freq: Once | INTRAVENOUS | Status: AC | PRN
Start: 2017-09-30 — End: 2017-09-30
  Administered 2017-09-30: 22:00:00 125 mL via INTRAVENOUS

## 2017-09-30 MED ORDER — sodium chloride 0.9 % infusion
INTRAVENOUS | Status: AC
Start: 2017-09-30 — End: 2017-09-30
  Administered 2017-09-30: 22:00:00 50

## 2017-09-30 MED ORDER — ondansetron (ZOFRAN) injection 4 mg
4 | Freq: Once | INTRAMUSCULAR | Status: AC
Start: 2017-09-30 — End: 2017-09-30
  Administered 2017-09-30: 21:00:00 4 mg via INTRAVENOUS

## 2017-09-30 MED ORDER — oxyCODONE-acetaminophen (PERCOCET) 5-325 mg per tablet 2 tablet
5-325 | Freq: Once | ORAL | Status: AC
Start: 2017-09-30 — End: 2017-09-30
  Administered 2017-09-30: 2 via ORAL

## 2017-09-30 MED FILL — OXYCODONE-ACETAMINOPHEN 5 MG-325 MG TABLET: 5-325 5-325 mg | ORAL | Qty: 2

## 2017-09-30 MED FILL — MORPHINE 4 MG/ML INTRAVENOUS SOLUTION: 4 4 mg/mL | INTRAVENOUS | Qty: 1

## 2017-09-30 MED FILL — ONDANSETRON HCL (PF) 4 MG/2 ML INJECTION SOLUTION: 4 4 mg/2 mL | INTRAMUSCULAR | Qty: 2

## 2017-09-30 MED FILL — LACTATED RINGERS INTRAVENOUS SOLUTION: 1000.00 1000.00 mL | INTRAVENOUS | Qty: 1000

## 2017-09-30 MED FILL — SODIUM CHLORIDE 0.9 % INTRAVENOUS SOLUTION: INTRAVENOUS | Qty: 50

## 2017-09-30 NOTE — Unmapped (Signed)
Pt urine sent as per order. Pt states her abd pain has not improved but does not want any pain medication. Pt hs her call light

## 2017-09-30 NOTE — Unmapped (Signed)
Abd. Pain Pt. had endometrial ablasion on 12/26.

## 2017-09-30 NOTE — Unmapped (Signed)
[place] ED Note    09/30/2017    Reason for Visit: Abdominal Pain (Abd. Pain Pt. had endometrial ablasion on 12/26.)        Patient History     HPI:  Mary [name] is a 46 y.o. White or Caucasian female with a PMH as described below who presents to the ED today with a chief complaint of: Abdominal Pain. Pt reports she had an endometrial ablation on 09/28/17 and has since had lower abdominal pain. Pain is worsened with movement and deep inspiration. Pt states she feels bloated and complains of an associated nausea, headache, and bloody vaginal discharge. There is no other vaginal discharge. The pt also reports a fever that began yesterday of Tmax 100.6 degrees that has since resolved prior to arrival. Pt was prescribed percocet following her procedure but states she has stopped taking this prescription in order to have a bowel movement. The patient states that there were no other associated signs and symptoms or modifying factors. No dysuria or hematuria. No V/D.    Patient rates pain as 5/10.    Past Medical History:   Diagnosis Date   ??? ADD (attention deficit disorder)    ??? Anxiety    ??? Asthma    ??? Cervical dysplasia    ??? Chronic back pain    ??? Depression    ??? Diverticulitis    ??? Environmental allergies    ??? Iron deficiency    ??? Varicella    ??? Vitamin D deficiency        Past Surgical History:   Procedure Laterality Date   ??? ARTHROSCOPY KNEE MENISECTOMY, MENISCAL REPAIR Right 2013    right   ??? AUGMENTATION BREAST ENDOSCOPIC  2001    [place], [place]  implant replacement due to left implant rupture    ??? AUGMENTATION MAMMAPLASTY  age 37-24    [place], [place]   ??? BIOPSY BREAST NEEDLE LOCALIZATION Right 06/02/2016    Procedure: RIGHT BREAST NEEDLE LOCALIZED EXCISIONAL BIOPSY OF 2 AREAS,   ;  Surgeon: Cristal Generous, MD;  Location: [facility] OR;  Service: General;  Laterality: Right;   ??? BREAST EXCISIONAL BIOPSY      atypical cells   ??? CERVICAL CONE BIOPSY     ??? ENDOMETRIAL BIOPSY       benign polyp   ??? HYSTEROSCOPY ABLATION ENDOMETRIAL N/A 06/02/2016    Procedure: HYSTEROSCOPIC RESECTION OF ENDOMETRIAL POLYP;  Surgeon: Raylene Everts;  Location: [facility] OR;  Service: Gynecology;  Laterality: N/A;   ??? uterine biopsy       x2       Social History     Social History   ??? Marital status: Married     Spouse name: N/A   ??? Number of children: N/A   ??? Years of education: N/A     Occupational History   ??? Not on file.     Social History Main Topics   ??? Smoking status: Never Smoker   ??? Smokeless tobacco: Never Used      Comment: 02/24/2011; passive smoke exposure:no (06/18/2005)   ??? Alcohol use 1.2 oz/week     2 Glasses of wine per week      Comment: socially   ??? Drug use: No      Comment: 01/07/2011   ??? Sexual activity: Not on file     Other Topics Concern   ??? Caffeine Use No   ??? Occupational Exposure No   ??? Exercise Yes  maybe 2 days a week   ??? Seat Belt Yes     Social History Narrative    Currently not working. Lives in a newer home with husband and 2 kids, no known mold in the home, no pets at home, no occupational exposures.        family history includes Allergies in her other; Asthma in her father and other; Bone Cancer (age of onset: 54) in her paternal grandmother; Hypertension in her father; Uterine Cancer (age of onset: 40) in her mother.    No Known Allergies    Discharge Medication List as of 09/30/2017  7:26 PM      CONTINUE these medications which have NOT CHANGED    Details   cetirizine (ZYRTEC) 10 MG tablet Take 10 mg by mouth daily as needed for Allergies., Historical Med      cholecalciferol, vitamin D3, (VITAMIN D3) 5,000 unit Tab Take by mouth., Historical Med      dextroamphetamine-amphetamine (ADDERALL XR) 20 MG 24 hr capsule Take 2 capsules (40 mg total) by mouth every morning for 30 days.   Earliest Fill Date: 10/04/17, Starting Tue 10/04/2017, Until Thu 11/03/2017, Print, Disp-60 capsule, R-0      dextroamphetamine-amphetamine (ADDERALL) 10 mg Tab Take 1 tablet (10 mg total) by mouth  daily as needed for up to 30 days.   Earliest Fill Date: 10/04/17, Starting Tue 10/04/2017, Until Thu 11/03/2017, Print, Disp-30 tablet, R-0      escitalopram oxalate (LEXAPRO) 20 MG tablet Take 1 tablet (20 mg total) by mouth daily., Starting Fri 08/05/2017, Normal, Disp-90 tablet, R-3      FERROUS SULFATE (IRON ORAL) Take by mouth., Historical Med      fluticasone (FLONASE) 50 mcg/actuation nasal spray SHAKE LIQUID AND USE 1 TO 2 SPRAYS IN EACH NOSTRIL DAILY AS NEEDED, Normal      fluticasone-salmeterol (ADVAIR DISKUS) 500-50 mcg/dose DsDv Inhale 1 puff into the lungs 2 times a day., Starting Tue 10/26/2016, Normal      melatonin 10 mg Cap Take by mouth., Until Discontinued, Historical Med      omeprazole (PRILOSEC) 40 MG capsule Take 1 capsule (40 mg total) by mouth daily., Starting Thu 02/10/2017, Until Fri 02/10/2018, Normal      predniSONE (DELTASONE) 20 MG tablet Take 1 tablet (20 mg total) by mouth 2 times a day., Starting Thu 02/10/2017, Normal      PROAIR HFA 90 mcg/actuation inhaler INHALE 1 TO 2 PUFFS INTO THE LUNGS EVERY 4 TO 6 HOURS AS NEEDED FOR WHEEZING, Normal      vit b complex w-b 12 tablet Take 1 tablet by mouth daily., Historical Med               All nursing notes and triage notes were appropriately reviewed in the course of the creation of this note.     Review of Systems     ROS:  As described above in the HPI otherwise all the systems reviewed and negative as reported by the patient.      Physical Exam     Vitals:    09/30/17 1730 09/30/17 1800 09/30/17 1830 09/30/17 1900   BP:    116/68   BP Location:       Patient Position:       Pulse:    78   Resp:    16   Temp:       TempSrc:       SpO2: 99% 99% 98% 99%   Weight:  Height:             Physical Exam   Constitutional: She is oriented to person, place, and time. She appears well-developed and well-nourished.   HENT:   Head: Normocephalic and atraumatic.   Mouth/Throat: Oropharynx is clear and moist.   Eyes: Pupils are equal, round, and reactive  to light. EOM are normal.   Neck: Normal range of motion. Neck supple.   Cardiovascular: Normal rate, regular rhythm, normal heart sounds and intact distal pulses.    Pulmonary/Chest: Effort normal and breath sounds normal.   Abdominal: Soft. Bowel sounds are normal.   Patient with mild diffuse tenderness somewhat worse in the right upper quadrant.  No significant rebound tenderness.  No involuntary guarding.   Genitourinary:   Genitourinary Comments: Pelvic exam deferred by patient   Musculoskeletal: Normal range of motion. She exhibits no edema.   Neurological: She is alert and oriented to person, place, and time.   Skin: Skin is warm. Capillary refill takes less than 2 seconds.   Psychiatric: She has a normal mood and affect.   Nursing note and vitals reviewed.          Diagnostic Studies     Labs:    Please see electronic medical record for any tests performed in the ED     Labs Reviewed   DIFFERENTIAL - Abnormal; Notable for the following:        Result Value    Neutrophils Relative 85.3 (*)     Lymphocytes Relative 8.1 (*)     Lymphocytes Absolute 640 (*)     All other components within normal limits   BASIC METABOLIC PANEL - Abnormal; Notable for the following:     Glucose 107 (*)     All other components within normal limits   VENOUS BLOOD GAS, LINE/SYRINGE - Abnormal; Notable for the following:     Reduced Hemoglobin-Line Draw 48.1 (*)     All other components within normal limits   URINALYSIS W/RFL MICROSCOP, RFL CULTURE - Abnormal; Notable for the following:     Blood, UA Moderate (*)     Bacteria, UA Rare (*)     All other components within normal limits    Narrative:     Microscopic testing not performed when the dipstick is negative for Blood, Leukocyte, Protein, and Nitrite.  Urine Culture will not be performed if WBC <= 5, Nitrite negative, Leukocyte negative, and Bacteria less than Few.   CBC   LACTIC ACID   HEPATIC FUNCTION PANEL   LIPASE       Radiology:    Please see electronic medical record for  any tests performed in the ED    CT ABDOMEN AND PELVIS WITH IV CONTRAST  XR ABDOMEN AP VIEW ONLY        Emergency Department Procedures     None    ED Course and MDM     DARSHA ZUMSTEIN is a 46 y.o. White or Caucasian female  who presented to the emergency department with Abdominal Pain (Abd. Pain Pt. had endometrial ablasion on 12/26.)   who was examined by myself.  Examination the patient is overall well-appearing with diffuse abdominal pain following an endometrial ablation 2 days ago.  She does not have peritoneal signs although given her recent instrumentation, laboratory studies and a CT scan of the abdomen and pelvis were performed to evaluate for postoperative complication.  She is having some vaginal bleeding but denies any discharge that would be  concerning for endometritis.  Pelvic exam was deferred by the patient.  Laboratory studies and CT scan were overall reassuring and urinalysis did not show any evidence of infection.  CT scan showed mixed gas and soft tissue density and hemorrhage in the endometrium and endometrial canal felt to be related to the ablation.  No other acute intra-abdominal abnormalities visualized.  On reassessment the patient was feeling somewhat better after pain medication was administered.  I will extend her pain medication duration for a few days longer and she was instructed to take a bowel regimen to prevent constipation.  She was instructed to follow-up with her primary surgeon.  We placed a call to Dr. Darnelle Bos but did not hear back after multiple hours.        The patient was discharged with a prescription containing opioid-based medications.  The patient was counseled on the usage of these medications and the potential addicting nature.  They were counseled to take only the smallest amount necessary for the shortest period of time to control pain.  Additionally they were told that they should not drive, operate machinery, take care of small children, or engage in any  potentially dangerous activity while taking this medication.      ED Treatment:  Medications   lactated Ringers 1,000 mL IV fluid (0 mLs Intravenous Stopped 09/30/17 1643)   morphine injection 4 mg (4 mg Intravenous Given 09/30/17 1559)   ondansetron (ZOFRAN) injection 4 mg (4 mg Intravenous Given 09/30/17 1559)   sodium chloride 0.9 % infusion (  Stopped 09/30/17 1705)   OMNIPAQUE (iohexol) 350 mg iodine/mL 125 mL (125 mLs Intravenous Given 09/30/17 1703)   oxyCODONE-acetaminophen (PERCOCET) 5-325 mg per tablet 2 tablet (2 tablets Oral Given 09/30/17 1834)       Impression:  1)Postoperative abdominal pain following an endometrial ablation    Plan:  1) strict return precautions, follow up with primary OB/GYN     Waynard Edwards   Home Medication Instructions [id number]    Printed on:09/30/17 1448   Medication Information                      cetirizine (ZYRTEC) 10 MG tablet  Take 10 mg by mouth daily as needed for Allergies.             cholecalciferol, vitamin D3, (VITAMIN D3) 5,000 unit Tab  Take by mouth.             dextroamphetamine-amphetamine (ADDERALL XR) 20 MG 24 hr capsule  Take 2 capsules (40 mg total) by mouth every morning for 30 days.   Earliest Fill Date: 10/04/17             dextroamphetamine-amphetamine (ADDERALL) 10 mg Tab  Take 1 tablet (10 mg total) by mouth daily as needed for up to 30 days.   Earliest Fill Date: 10/04/17             escitalopram oxalate (LEXAPRO) 20 MG tablet  Take 1 tablet (20 mg total) by mouth daily.             FERROUS SULFATE (IRON ORAL)  Take by mouth.             fluticasone (FLONASE) 50 mcg/actuation nasal spray  SHAKE LIQUID AND USE 1 TO 2 SPRAYS IN EACH NOSTRIL DAILY AS NEEDED             fluticasone-salmeterol (ADVAIR DISKUS) 500-50 mcg/dose DsDv  Inhale 1 puff into the lungs 2 times  a day.             melatonin 10 mg Cap  Take by mouth.             omeprazole (PRILOSEC) 40 MG capsule  Take 1 capsule (40 mg total) by mouth daily.             predniSONE (DELTASONE) 20  MG tablet  Take 1 tablet (20 mg total) by mouth 2 times a day.             PROAIR HFA 90 mcg/actuation inhaler  INHALE 1 TO 2 PUFFS INTO THE LUNGS EVERY 4 TO 6 HOURS AS NEEDED FOR WHEEZING             vit b complex w-b 12 tablet  Take 1 tablet by mouth daily.                 Future Appointments  Date Time Provider Department Center   05/01/2018 9:50 AM [facility] MAMMO 1 [facility] [place] [facility]   05/01/2018 10:30 AM Cristal Generous, MD [facility]         Critical Care Time (Attendings)          By signing my name below, I, Theresia Lo, attest that this documentation has been prepared under the direction and in the presence of Joneen Boers, MD  Scribe name: Theresia Lo  Date: 10/04/2017      ----  Attending Physician Attestation  The scribe???s documentation has been prepared under my direction and personally reviewed by me in its entirety. I confirm that the note above accurately reflects all work, treatment, procedures, and medical decision making performed by me.     Joneen Boers, MD  Attending Physician Emergency Medicine  10/04/2017 8:26 PM           Rudi Rummage, MD  10/04/17 2028

## 2017-10-10 MED ORDER — PROAIR HFA 90 mcg/actuation inhaler
90 | RESPIRATORY_TRACT | 0 refills | Status: AC
Start: 2017-10-10 — End: 2017-11-11

## 2017-11-05 MED ORDER — ADVAIR DISKUS 500-50 mcg/dose DsDv
500-50 | RESPIRATORY_TRACT | 3 refills | Status: AC
Start: 2017-11-05 — End: 2018-06-08

## 2017-11-11 ENCOUNTER — Ambulatory Visit: Admit: 2017-11-11 | Payer: PRIVATE HEALTH INSURANCE

## 2017-11-11 DIAGNOSIS — F418 Other specified anxiety disorders: Secondary | ICD-10-CM

## 2017-11-11 MED ORDER — dextroamphetamine-amphetamine (ADDERALL) 10 mg Tab
10 | ORAL_TABLET | Freq: Every day | ORAL | 0 refills | Status: AC | PRN
Start: 2017-11-11 — End: 2018-02-17

## 2017-11-11 MED ORDER — dextroamphetamine-amphetamine (ADDERALL XR) 20 MG 24 hr capsule
20 | ORAL_CAPSULE | Freq: Every morning | ORAL | 0 refills | Status: AC
Start: 2017-11-11 — End: 2018-02-17

## 2017-11-11 MED ORDER — dextroamphetamine-amphetamine (ADDERALL XR) 20 MG 24 hr capsule
20 | ORAL_CAPSULE | Freq: Every morning | ORAL | 0 refills | Status: AC
Start: 2017-11-11 — End: 2017-11-11

## 2017-11-11 MED ORDER — dextroamphetamine-amphetamine (ADDERALL) 10 mg Tab
10 | ORAL_TABLET | Freq: Every day | ORAL | 0 refills | Status: AC | PRN
Start: 2017-11-11 — End: 2017-11-11

## 2017-11-11 MED ORDER — albuterol (PROAIR HFA) 90 mcg/actuation inhaler
90 | Freq: Four times a day (QID) | RESPIRATORY_TRACT | 3 refills | Status: AC | PRN
Start: 2017-11-11 — End: 2018-09-11

## 2017-11-11 NOTE — Unmapped (Signed)
Subjective   HPI:   Patient ID: Mary [name] is a 47 y.o. female.    Chief Complaint:  HPI         The patient's chart was reviewed prior to the visit as part of pre-visit planning  Chief Complaint   Patient presents with   ??? Medication Refill      Problems:fu ADD, GAD 7    Doing well c/w meds :doing better w Refill IR adderall 10mg /d prn (not every day but most days) w AXR 40mg  once daily   Denies any s/e  Does feel ++ anxious recently, some depn now as well; in nov lexapro inc'ed to 20mg , no real change no sig s/e   Stress: I worry a lot; stressors havent changed much  i'm not as active as I usually like to be  Denies diversion or st/drug abuse    Controlled Substance Monitoring 09/09/2016 10/26/2016 02/10/2017 03/08/2017 04/28/2017 08/05/2017 11/11/2017   OARRS/eKASPER Status Reviewed Reviewed Reviewed Reviewed Reviewed Reviewed Reviewed   OARRS/eKASPER Consistent with Prescriber Expectation [name]     I have completed the required OARRS/eKASPER documentation for this patient on 11/11/2017.  Florine Sprenkle [name]      Past Medical History:   Diagnosis Date   ??? ADD (attention deficit disorder)    ??? Anxiety    ??? Asthma    ??? Cervical dysplasia    ??? Chronic back pain    ??? Depression    ??? Diverticulitis    ??? Environmental allergies    ??? Iron deficiency    ??? Varicella    ??? Vitamin D deficiency        Current Outpatient Prescriptions   Medication Sig Dispense Refill   ??? ADVAIR DISKUS 500-50 mcg/dose DsDv USE 1 INHALATION TWICE A DAY 180 each 3   ??? cetirizine (ZYRTEC) 10 MG tablet Take 10 mg by mouth daily as needed for Allergies.     ??? cholecalciferol, vitamin D3, (VITAMIN D3) 5,000 unit Tab Take by mouth.     ??? escitalopram oxalate (LEXAPRO) 20 MG tablet Take 1 tablet (20 mg total) by mouth daily. 90 tablet 3   ??? FERROUS SULFATE (IRON ORAL) Take by mouth.     ??? fluticasone (FLONASE) 50 mcg/actuation nasal spray SHAKE LIQUID AND USE 1 TO 2 SPRAYS IN EACH NOSTRIL DAILY AS NEEDED 16 g 0   ??? melatonin 10 mg Cap Take by  mouth.     ??? omeprazole (PRILOSEC) 40 MG capsule Take 1 capsule (40 mg total) by mouth daily. 90 capsule 3   ??? PROAIR HFA 90 mcg/actuation inhaler INHALE 1 TO 2 PUFFS INTO THE LUNGS EVERY 4-6 HOURS AS NEEDED FOR WHEEZING 17 g 0   ??? dextroamphetamine-amphetamine (ADDERALL XR) 20 MG 24 hr capsule Take 2 capsules (40 mg total) by mouth every morning for 30 days.   Earliest Fill Date: 10/04/17 60 capsule 0   ??? dextroamphetamine-amphetamine (ADDERALL) 10 mg Tab Take 1 tablet (10 mg total) by mouth daily as needed for up to 30 days.   Earliest Fill Date: 10/04/17 30 tablet 0   ??? predniSONE (DELTASONE) 20 MG tablet Take 1 tablet (20 mg total) by mouth 2 times a day. 10 tablet 0     No current facility-administered medications for this visit.           ROS:   Review of Systems  as above   Objective:   Physical Exam  Wt Readings from Last 3 Encounters:   11/11/17 157 lb (71.2 kg)   09/30/17 160 lb 9.6 oz (72.8 kg)   09/02/17 156 lb 9.6 oz (71 kg)         Vitals:    11/11/17 1440   BP: 118/84   Pulse: 94   Temp: 98.4 ??F (36.9 ??C)   TempSrc: Oral   SpO2: 98%   Weight: 157 lb (71.2 kg)   Height: 5' 6 (1.676 m)     Body mass index is 25.34 kg/m??.  Body surface area is 1.82 meters squared.    NAD  Psych: A&Ox3; reactive affect; no obv thought d/o       Lab Results   Component Value Date    WBC 7.9 09/30/2017    HGB 12.3 09/30/2017    HCT 37.3 09/30/2017    MCV 88.7 09/30/2017    PLT 229 09/30/2017       No results found for: HGBA1C  No components found for: GLUF,  MICROALBUR,  LDLCALC,  CREATININE    Lab Results   Component Value Date    GLUCOSE 107 (H) 09/30/2017       No results found for: [facility]       Health Maintenance Summary                Diabetes Screening Overdue 1969-08[birth year]08     Comprehensive Physical Exam Overdue 1969/04/30     Alcohol Misuse Screening Overdue 06/19/1987     HIV Screening Overdue 06/19/1987     Depression Screening Overdue 06/19/1987     Immunization: DTaP/Tdap/Td Overdue 06/18/1988     Lipid Panel Overdue  06/18/2014     Cervical Cancer Screening/Pap Smear (MyChart) Overdue 06/04/2017             Assessment/Plan:         A/P:  1. ADD-doing well;  continue with the current treatment ;  oarrs report:  no problems; uds done in the past: no red flags;    2 GAD: some depn as well; cont lexapro to 20mg , willing to try CBT    RTC: 3 months    New meds:   none  Patient education given  none  Patient verbalized understanding & agreement of the proposed plan.

## 2017-11-11 NOTE — Unmapped (Addendum)
anxiety with some depression  PsychBC: can help with the CBT    Health Maintenance Summary                Diabetes Screening Overdue 07-Feb-1969     Comprehensive Physical Exam Overdue 24-Jul-1969     Alcohol Misuse Screening Overdue 06/19/1987     HIV Screening Overdue 06/19/1987     Depression Screening Overdue 06/19/1987     Immunization: DTaP/Tdap/Td Overdue 06/18/1988     Lipid Panel Overdue 06/18/2014     Cervical Cancer Screening/Pap Smear (MyChart) Overdue 06/04/2017

## 2018-02-17 ENCOUNTER — Ambulatory Visit: Admit: 2018-02-17 | Payer: PRIVATE HEALTH INSURANCE

## 2018-02-17 DIAGNOSIS — F988 Other specified behavioral and emotional disorders with onset usually occurring in childhood and adolescence: Secondary | ICD-10-CM

## 2018-02-17 MED ORDER — dextroamphetamine-amphetamine (ADDERALL) 10 mg Tab
10 | ORAL_TABLET | Freq: Every day | ORAL | 0 refills | Status: AC | PRN
Start: 2018-02-17 — End: 2018-06-08

## 2018-02-17 MED ORDER — dextroamphetamine-amphetamine (ADDERALL XR) 20 MG 24 hr capsule
20 | ORAL_CAPSULE | Freq: Every morning | ORAL | 0 refills | Status: AC
Start: 2018-02-17 — End: 2018-06-08

## 2018-02-17 MED ORDER — dextroamphetamine-amphetamine (ADDERALL) 10 mg Tab
10 | ORAL_TABLET | Freq: Every day | ORAL | 0 refills | Status: AC | PRN
Start: 2018-02-17 — End: 2018-02-17

## 2018-02-17 MED ORDER — dextroamphetamine-amphetamine (ADDERALL XR) 20 MG 24 hr capsule
20 | ORAL_CAPSULE | Freq: Every morning | ORAL | 0 refills | Status: AC
Start: 2018-02-17 — End: 2018-02-17

## 2018-02-17 NOTE — Unmapped (Addendum)
Subjective   HPI:   Patient ID: Mary [name] is a 47 y.o. female.    Chief Complaint:  HPI         The patient's chart was reviewed prior to the visit as part of pre-visit planning  Chief Complaint   Patient presents with   ??? Medication Refill     Refill needed for Adderall      Problems:fu ADD, GAD 7    Doing well c/w meds :doing better w Refill IR adderall 10mg /d pretty much every day w AXR 40mg  once daily   Denies any s/e  Does feel ++ anxious recently,  in nov lexapro inc'ed to 20mg  I think its fine   Stress: I worry a lot; youngest child graduating from high school, spouse recently lost job  Exercise: none recently, does like tennis  Denies diversion or st/drug abuse    Controlled Substance Monitoring 10/26/2016 02/10/2017 03/08/2017 04/28/2017 08/05/2017 11/11/2017 02/17/2018   OARRS/eKASPER Status Reviewed Reviewed Reviewed Reviewed Reviewed Reviewed Reviewed   OARRS/eKASPER Consistent with Prescriber Expectation [name]     I have completed the required OARRS/eKASPER documentation for this patient on 02/17/2018.  Sanaai Doane [name]      Past Medical History:   Diagnosis Date   ??? ADD (attention deficit disorder)    ??? Anxiety    ??? Asthma    ??? Cervical dysplasia    ??? Chronic back pain    ??? Depression    ??? Diverticulitis    ??? Environmental allergies    ??? Iron deficiency    ??? Varicella    ??? Vitamin D deficiency        Current Outpatient Prescriptions   Medication Sig Dispense Refill   ??? ADVAIR DISKUS 500-50 mcg/dose DsDv USE 1 INHALATION TWICE A DAY 180 each 3   ??? albuterol (PROAIR HFA) 90 mcg/actuation inhaler Inhale 2 puffs into the lungs every 6 hours as needed for Wheezing. 3 Inhaler 3   ??? cetirizine (ZYRTEC) 10 MG tablet Take 10 mg by mouth daily as needed for Allergies.     ??? cholecalciferol, vitamin D3, (VITAMIN D3) 5,000 unit Tab Take by mouth.     ??? escitalopram oxalate (LEXAPRO) 20 MG tablet Take 1 tablet (20 mg total) by mouth daily. 90 tablet 3   ??? FERROUS SULFATE (IRON ORAL) Take by mouth.     ???  fluticasone (FLONASE) 50 mcg/actuation nasal spray SHAKE LIQUID AND USE 1 TO 2 SPRAYS IN EACH NOSTRIL DAILY AS NEEDED 16 g 0   ??? melatonin 10 mg Cap Take by mouth.     ??? predniSONE (DELTASONE) 20 MG tablet Take 1 tablet (20 mg total) by mouth 2 times a day. 10 tablet 0   ??? dextroamphetamine-amphetamine (ADDERALL XR) 20 MG 24 hr capsule Take 2 capsules (40 mg total) by mouth every morning for 30 days. 60 capsule 0   ??? [START ON 04/18/2018] dextroamphetamine-amphetamine (ADDERALL XR) 20 MG 24 hr capsule Take 2 capsules (40 mg total) by mouth every morning for 30 days. 60 capsule 0   ??? [START ON 03/19/2018] dextroamphetamine-amphetamine (ADDERALL) 10 mg Tab Take 1 tablet (10 mg total) by mouth daily as needed for up to 30 days. 30 tablet 0   ??? [START ON 04/18/2018] dextroamphetamine-amphetamine (ADDERALL) 10 mg Tab Take 1 tablet (10 mg total) by mouth daily as needed for up to 30 days. 30 tablet 0     No current facility-administered medications for this  visit.           ROS:   Review of Systems  as above   Objective:   Physical Exam      Wt Readings from Last 3 Encounters:   02/17/18 154 lb 3.2 oz (69.9 kg)   11/11/17 157 lb (71.2 kg)   09/30/17 160 lb 9.6 oz (72.8 kg)         Vitals:    02/17/18 1404   BP: 136/84   Pulse: 94   SpO2: 99%   Weight: 154 lb 3.2 oz (69.9 kg)   Height: 5' 6 (1.676 m)     Body mass index is 24.89 kg/m??.  Body surface area is 1.8 meters squared.    NAD  Psych: A&Ox3; reactive affect; no obv thought d/o       Lab Results   Component Value Date    WBC 7.9 09/30/2017    HGB 12.3 09/30/2017    HCT 37.3 09/30/2017    MCV 88.7 09/30/2017    PLT 229 09/30/2017       No results found for: HGBA1C  No components found for: GLUF,  MICROALBUR,  LDLCALC,  CREATININE    Lab Results   Component Value Date    GLUCOSE 107 (H) 09/30/2017       No results found for: [facility]       Health Maintenance Summary                Diabetes Screening Overdue December 09, 1968     Depression Monitoring (PHQ-9) Overdue 11/10/1968      Comprehensive Physical Exam Overdue 08-15-1969     Alcohol Misuse Screening Overdue 06/19/1987     HIV Screening Overdue 06/19/1987     Immunization: DTaP/Tdap/Td Overdue 06/18/1988     Lipid Panel Overdue 06/18/2014     Cervical Cancer Screening/Pap Smear (MyChart) Next Due 05/13/2020             Assessment/Plan:         A/P:  1. ADD-doing well;  continue with the current treatment ;  oarrs report:  no problems; uds done in the past: no red flags;    2 GAD: some depn as well; cont lexapro to 20mg , willing to try self CBT    RTC: 3 months    New meds:   none  Patient education given  none  Patient verbalized understanding & agreement of the proposed plan.

## 2018-02-17 NOTE — Unmapped (Addendum)
Anxiety: recent increased stress  website for free online self- CBT: https://www.getselfhelp.co.uk/step1.htm

## 2018-04-05 NOTE — Telephone Encounter (Signed)
PA for Adderall 20 Mg has been approved.  PA for Adderall 10 Mg is in pending review.  We will hear a response in 1-5 business days.     Patient and pharmacy informed.

## 2018-04-13 NOTE — Telephone Encounter (Signed)
Please let her know to call her insurance  It doesn't make sense that they dont cover meds for add, most likely they will cover a different medicine, like stratera or ritalin. Can she call and find out what they will cover and let me know

## 2018-04-13 NOTE — Telephone Encounter (Signed)
PA for Adderall 10 Mg has been denied.  They will not cover it for the diagnosis of ADD.     LVM with patient. Informed her to call back with questions or if she wants to switch the medication.

## 2018-04-28 MED ORDER — predniSONE (DELTASONE) 20 MG tablet
20 | ORAL_TABLET | ORAL | 0 refills | Status: AC
Start: 2018-04-28 — End: 2018-06-08

## 2018-06-08 ENCOUNTER — Ambulatory Visit: Admit: 2018-06-08 | Payer: PRIVATE HEALTH INSURANCE

## 2018-06-08 DIAGNOSIS — J4541 Moderate persistent asthma with (acute) exacerbation: Secondary | ICD-10-CM

## 2018-06-08 MED ORDER — dextroamphetamine-amphetamine (ADDERALL) 10 mg Tab
10 | ORAL_TABLET | Freq: Every day | ORAL | 0 refills | Status: AC | PRN
Start: 2018-06-08 — End: 2018-06-08

## 2018-06-08 MED ORDER — dextroamphetamine-amphetamine (ADDERALL) 10 mg Tab
10 | ORAL_TABLET | Freq: Every day | ORAL | 0 refills | Status: AC | PRN
Start: 2018-06-08 — End: 2018-09-11

## 2018-06-08 MED ORDER — montelukast (SINGULAIR) 10 mg tablet
10 | ORAL_TABLET | Freq: Every evening | ORAL | 3 refills | Status: AC
Start: 2018-06-08 — End: 2018-12-29

## 2018-06-08 MED ORDER — dextroamphetamine-amphetamine (ADDERALL XR) 20 MG 24 hr capsule
20 | ORAL_CAPSULE | Freq: Every morning | ORAL | 0 refills | Status: AC
Start: 2018-06-08 — End: 2018-06-08

## 2018-06-08 MED ORDER — predniSONE (DELTASONE) 10 MG tablet
10 | ORAL_TABLET | ORAL | 0 refills | Status: AC
Start: 2018-06-08 — End: 2018-06-15

## 2018-06-08 MED ORDER — predniSONE (DELTASONE) 20 MG tablet
20 | ORAL_TABLET | ORAL | 0 refills | Status: AC
Start: 2018-06-08 — End: 2019-05-16

## 2018-06-08 MED ORDER — dextroamphetamine-amphetamine (ADDERALL XR) 20 MG 24 hr capsule
20 | ORAL_CAPSULE | Freq: Every morning | ORAL | 0 refills | Status: AC
Start: 2018-06-08 — End: 2018-09-11

## 2018-06-08 MED ORDER — fluticasone propion-salmeterol (ADVAIR DISKUS) 500-50 mcg/dose DsDv
500-50 | Freq: Two times a day (BID) | RESPIRATORY_TRACT | 3 refills | Status: AC
Start: 2018-06-08 — End: 2018-12-11

## 2018-06-08 NOTE — Progress Notes (Signed)
Subjective   HPI:   Patient ID: Mary [name] is a 47 y.o. female.    Chief Complaint:  HPI         The patient's chart was reviewed prior to the visit as part of pre-visit planning  No chief complaint on file.     Problems:fu ADD, asthma    Doing well c/w meds :   IR adderall 10mg /d:hasnt been able to get from insurance recently needs an approval   C/w AXR 40mg  once daily   Denies any s/e   Denies diversion or st/drug abuse    Asthma terrible recently   Was out of town, completed prednisone x 5d Sunday, again feeling more chest tightness and using albuterol a lot   C/w advair: but needs refill; past other combo meds havent been effective so pulm rec to stay on advair   hasnt been singulair   pulm fu: none curently        Controlled Substance Monitoring 02/10/2017 03/08/2017 04/28/2017 08/05/2017 11/11/2017 02/17/2018 06/08/2018   OARRS/eKASPER Status Reviewed Reviewed Reviewed Reviewed Reviewed Reviewed Reviewed   OARRS/eKASPER Consistent with Prescriber Expectation [name]     I have completed the required OARRS/eKASPER documentation for this patient on 06/08/2018.  Allisson Schindel [name]      Past Medical History:   Diagnosis Date    ADD (attention deficit disorder)     Anxiety     Asthma     Cervical dysplasia     Chronic back pain     Depression     Diverticulitis     Environmental allergies     Iron deficiency     Varicella     Vitamin D deficiency        Current Outpatient Prescriptions   Medication Sig Dispense Refill    ADVAIR DISKUS 500-50 mcg/dose DsDv USE 1 INHALATION TWICE A DAY 180 each 3    albuterol (PROAIR HFA) 90 mcg/actuation inhaler Inhale 2 puffs into the lungs every 6 hours as needed for Wheezing. 3 Inhaler 3    cetirizine (ZYRTEC) 10 MG tablet Take 10 mg by mouth daily as needed for Allergies.      cholecalciferol, vitamin D3, (VITAMIN D3) 5,000 unit Tab Take by mouth.      escitalopram oxalate (LEXAPRO) 20 MG tablet Take 1 tablet (20 mg total) by mouth daily. 90 tablet 3    FERROUS  SULFATE (IRON ORAL) Take by mouth.      fluticasone (FLONASE) 50 mcg/actuation nasal spray SHAKE LIQUID AND USE 1 TO 2 SPRAYS IN EACH NOSTRIL DAILY AS NEEDED 16 g 0    melatonin 10 mg Cap Take by mouth.      predniSONE (DELTASONE) 20 MG tablet TAKE 1 TABLET(20 MG) BY MOUTH TWICE DAILY 10 tablet 0    dextroamphetamine-amphetamine (ADDERALL XR) 20 MG 24 hr capsule Take 2 capsules (40 mg total) by mouth every morning for 30 days. 60 capsule 0    dextroamphetamine-amphetamine (ADDERALL XR) 20 MG 24 hr capsule Take 2 capsules (40 mg total) by mouth every morning for 30 days. 60 capsule 0    dextroamphetamine-amphetamine (ADDERALL) 10 mg Tab Take 1 tablet (10 mg total) by mouth daily as needed for up to 30 days. 30 tablet 0    dextroamphetamine-amphetamine (ADDERALL) 10 mg Tab Take 1 tablet (10 mg total) by mouth daily as needed for up to 30 days. 30 tablet 0     No current facility-administered medications for this  visit.           ROS:   Review of Systems  as above   Objective:   Physical Exam      Wt Readings from Last 3 Encounters:   06/08/18 157 lb 4.8 oz (71.4 kg)   02/17/18 154 lb 3.2 oz (69.9 kg)   11/11/17 157 lb (71.2 kg)     PF: 380, goal 470    Vitals:    06/08/18 1601   Weight: 157 lb 4.8 oz (71.4 kg)   Height: 5' 6 (1.676 m)     Body mass index is 25.39 kg/m.  Body surface area is 1.82 meters squared.    NAD  LCTAB  Psych: A&Ox3; reactive affect; no obv thought d/o       Lab Results   Component Value Date    WBC 7.9 09/30/2017    HGB 12.3 09/30/2017    HCT 37.3 09/30/2017    MCV 88.7 09/30/2017    PLT 229 09/30/2017       No results found for: HGBA1C  No components found for: GLUF,  MICROALBUR,  LDLCALC,  CREATININE    Lab Results   Component Value Date    GLUCOSE 107 (H) 09/30/2017       No results found for: [facility]       Health Maintenance Summary                Diabetes Screening Overdue October 10, 1968     Depression Monitoring (PHQ-9) Overdue September 29, 1969     Comprehensive Physical Exam Overdue  Mar 20, 1969     Alcohol Misuse Screening Overdue 06/19/1987     HIV Screening Overdue 06/19/1987     Immunization: DTaP/Tdap/Td Overdue 06/18/1988     Lipid Panel Overdue 06/18/2014     Immunization: Influenza Next Due 07/04/2018     Cervical Cancer Screening/Pap Smear (MyChart) Next Due 05/13/2020             Assessment/Plan:         A/P:  Patient Instructions     Asthma  - resume singulair  - prednisone 7 day taper  - resume follow up with pulmonary    ADD  - continue with the current treatment      Health Maintenance Summary                Diabetes Screening Overdue 07/04/69     Depression Monitoring (PHQ-9) Overdue Aug 04, 1969     Comprehensive Physical Exam Overdue 11-Jun-1969     Alcohol Misuse Screening Overdue 06/19/1987     HIV Screening Overdue 06/19/1987     Immunization: DTaP/Tdap/Td Overdue 06/18/1988     Lipid Panel Overdue 06/18/2014     Immunization: Influenza Next Due 07/04/2018     Cervical Cancer Screening/Pap Smear (MyChart) Next Due 05/13/2020                RTC: 3 months    New meds:   none  Patient education given  none  Patient verbalized understanding & agreement of the proposed plan.

## 2018-06-08 NOTE — Unmapped (Addendum)
Asthma  - resume singulair  - prednisone 7 day taper  - resume follow up with pulmonary    ADD  - continue with the current treatment      Health Maintenance Summary                Diabetes Screening Overdue 1971/08/13     Depression Monitoring (PHQ-9) Overdue Jun 12, 1969     Comprehensive Physical Exam Overdue 08-Feb-1969     Alcohol Misuse Screening Overdue 06/19/1987     HIV Screening Overdue 06/19/1987     Immunization: DTaP/Tdap/Td Overdue 06/18/1988     Lipid Panel Overdue 06/18/2014     Immunization: Influenza Next Due 07/04/2018     Cervical Cancer Screening/Pap Smear (MyChart) Next Due 05/13/2020

## 2018-09-04 MED ORDER — escitalopram oxalate (LEXAPRO) 20 MG tablet
20 | ORAL_TABLET | ORAL | 0 refills | Status: AC
Start: 2018-09-04 — End: 2019-01-22

## 2018-09-11 ENCOUNTER — Ambulatory Visit: Admit: 2018-09-11 | Payer: PRIVATE HEALTH INSURANCE

## 2018-09-11 DIAGNOSIS — F988 Other specified behavioral and emotional disorders with onset usually occurring in childhood and adolescence: Secondary | ICD-10-CM

## 2018-09-11 MED ORDER — albuterol (PROAIR HFA) 90 mcg/actuation inhaler
90 | Freq: Four times a day (QID) | RESPIRATORY_TRACT | 3 refills | Status: AC | PRN
Start: 2018-09-11 — End: 2018-12-29

## 2018-09-11 MED ORDER — dextroamphetamine-amphetamine (ADDERALL XR) 20 MG 24 hr capsule
20 | ORAL_CAPSULE | Freq: Every morning | ORAL | 0 refills | Status: AC
Start: 2018-09-11 — End: 2018-09-11

## 2018-09-11 MED ORDER — dextroamphetamine-amphetamine (ADDERALL XR) 20 MG 24 hr capsule
20 | ORAL_CAPSULE | Freq: Every morning | ORAL | 0 refills | Status: AC
Start: 2018-09-11 — End: 2018-12-11

## 2018-09-11 NOTE — Telephone Encounter (Signed)
Patient's spouse has an appt this afternoon for a physical, she would like to know if you can double book her for a quick med/check and refill?

## 2018-09-11 NOTE — Unmapped (Signed)
Subjective   HPI:   Patient ID: Mary [name] is a 47 y.o. female.    The following HPI was reviewed and copied forward (with edits) from a note written by me on 06/08/18. I have reviewed and updated the history, physical exam, data, assessment, and plan of the note as detailed below so that it reflects my evaluation and management of the patient.       The patient's chart was reviewed prior to the visit as part of pre-visit planning  No chief complaint on file.     Problems:fu ADD    Doing well c/w meds :   IR adderall 10mg /d:hasnt been able to get from insurance    C/w AXR 40mg  once daily   Denies any s/e   Denies diversion or st/drug abuse        Controlled Substance Monitoring 03/08/2017 04/28/2017 08/05/2017 11/11/2017 02/17/2018 06/08/2018 09/11/2018   OARRS/eKASPER Status Reviewed Reviewed Reviewed Reviewed Reviewed Reviewed Reviewed   OARRS/eKASPER Consistent with Prescriber Expectation [name]     I have completed the required OARRS/eKASPER documentation for this patient on 09/11/2018.  Bayan Kushnir [name]      Past Medical History:   Diagnosis Date   ??? ADD (attention deficit disorder)    ??? Anxiety    ??? Asthma    ??? Cervical dysplasia    ??? Chronic back pain    ??? Depression    ??? Diverticulitis    ??? Environmental allergies    ??? Iron deficiency    ??? Varicella    ??? Vitamin D deficiency        Current Outpatient Medications   Medication Sig Dispense Refill   ??? albuterol (PROAIR HFA) 90 mcg/actuation inhaler Inhale 2 puffs into the lungs every 6 hours as needed for Wheezing. 3 Inhaler 3   ??? cetirizine (ZYRTEC) 10 MG tablet Take 10 mg by mouth daily as needed for Allergies.     ??? cholecalciferol, vitamin D3, (VITAMIN D3) 5,000 unit Tab Take by mouth.     ??? dextroamphetamine-amphetamine (ADDERALL XR) 20 MG 24 hr capsule Take 2 capsules (40 mg total) by mouth every morning for 30 days. 60 capsule 0   ??? dextroamphetamine-amphetamine (ADDERALL XR) 20 MG 24 hr capsule Take 2 capsules (40 mg total) by mouth every morning for 30  days. 60 capsule 0   ??? dextroamphetamine-amphetamine (ADDERALL) 10 mg Tab Take 1 tablet (10 mg total) by mouth daily as needed for up to 30 days. 30 tablet 0   ??? dextroamphetamine-amphetamine (ADDERALL) 10 mg Tab Take 1 tablet (10 mg total) by mouth daily as needed for up to 30 days. 30 tablet 0   ??? escitalopram oxalate (LEXAPRO) 20 MG tablet TAKE 1 TABLET(20 MG) BY MOUTH DAILY 90 tablet 0   ??? FERROUS SULFATE (IRON ORAL) Take by mouth.     ??? fluticasone (FLONASE) 50 mcg/actuation nasal spray SHAKE LIQUID AND USE 1 TO 2 SPRAYS IN EACH NOSTRIL DAILY AS NEEDED 16 g 0   ??? fluticasone propion-salmeterol (ADVAIR DISKUS) 500-50 mcg/dose DsDv Inhale 1 puff into the lungs 2 times a day. 180 each 3   ??? melatonin 10 mg Cap Take by mouth.     ??? montelukast (SINGULAIR) 10 mg tablet Take 1 tablet (10 mg total) by mouth at bedtime. 90 tablet 3   ??? predniSONE (DELTASONE) 20 MG tablet 1 by mouth twice daily x 3 days then 1 by mouth once daily x 4 days  10 tablet 0     No current facility-administered medications for this visit.           ROS:   Review of Systems  as above   Objective:   Physical Exam      Wt Readings from Last 3 Encounters:   09/11/18 159 lb (72.1 kg)   06/08/18 157 lb 4.8 oz (71.4 kg)   02/17/18 154 lb 3.2 oz (69.9 kg)       Vitals:    09/11/18 1442   Weight: 159 lb (72.1 kg)     Body mass index is 25.66 kg/m??.  Body surface area is 1.83 meters squared.    NAD  Psych: A&Ox3; reactive affect; no obv thought d/o       Lab Results   Component Value Date    WBC 7.9 09/30/2017    HGB 12.3 09/30/2017    HCT 37.3 09/30/2017    MCV 88.7 09/30/2017    PLT 229 09/30/2017       No results found for: HGBA1C  No components found for: GLUF,  MICROALBUR,  LDLCALC,  CREATININE    Lab Results   Component Value Date    GLUCOSE 107 (H) 09/30/2017       No results found for: [facility]       Health Maintenance Summary                Diabetes Screening Overdue Dec 28, 1968     Depression Monitoring (PHQ-9) Overdue 04-27-1969      Comprehensive Physical Exam Overdue October 06, 1968     Alcohol Misuse Screening Overdue 06/19/1987     HIV Screening Overdue 06/19/1987     Immunization: DTaP/Tdap/Td Overdue 06/18/1988     Lipid Panel Overdue 06/18/2014     Immunization: Influenza Overdue 07/04/2018     Cervical Cancer Screening/Pap Smear (MyChart) Next Due 05/13/2020          Immunization History   Administered Date(s) Administered   ??? Influenza Preservative Free Quadrivalent 08/06/2015, 06/25/2016   ??? Influenza Preservative Free Quadrivalent Intradermal 07/04/2017   ??? Pneumococcal Polysaccharide 10/04/2008   ??? influenza unspec 05/31/2008         Assessment/Plan:         A/P:  There are no Patient Instructions on file for this visit.     RTC: 3 months    New meds:   none  Patient education given  none  Patient verbalized understanding & agreement of the proposed plan.

## 2018-12-11 ENCOUNTER — Ambulatory Visit: Admit: 2018-12-11 | Payer: PRIVATE HEALTH INSURANCE

## 2018-12-11 DIAGNOSIS — F988 Other specified behavioral and emotional disorders with onset usually occurring in childhood and adolescence: Secondary | ICD-10-CM

## 2018-12-11 MED ORDER — dextroamphetamine-amphetamine (ADDERALL XR) 20 MG 24 hr capsule
20 | ORAL_CAPSULE | Freq: Every morning | ORAL | 0 refills | Status: AC
Start: 2018-12-11 — End: 2019-01-13

## 2018-12-11 MED ORDER — budesonide (PULMICORT FLEXHALER) 180 mcg/actuation flexhaler
180 | Freq: Two times a day (BID) | RESPIRATORY_TRACT | 5 refills | Status: AC
Start: 2018-12-11 — End: 2018-12-29

## 2018-12-11 MED ORDER — dextroamphetamine-amphetamine (ADDERALL XR) 20 MG 24 hr capsule
20 | ORAL_CAPSULE | Freq: Every morning | ORAL | 0 refills | Status: AC
Start: 2018-12-11 — End: 2019-03-05

## 2018-12-11 NOTE — Unmapped (Signed)
Health Maintenance Summary                Diabetes Screening Overdue Apr 07, 1969     Depression Monitoring (PHQ-9) Overdue Dec 28, 1968     Comprehensive Physical Exam Overdue 1969-01-04     Alcohol Misuse Screening Overdue 06/19/1987     HIV Screening Overdue 06/19/1987     Immunization: DTaP/Tdap/Td Overdue 06/18/1988     Lipid Panel Overdue 06/18/2014     Cervical Cancer Screening/Pap Smear (MyChart) Next Due 05/13/2020           As a way to improve the service we provide, please complete the patient satisfaction survey that you will get in the mail

## 2018-12-11 NOTE — Unmapped (Signed)
Subjective   HPI:   Patient ID: Mary [name] is a 48 y.o. female.    The following HPI was reviewed and copied forward (with edits) from a note written by me on 06/08/18. I have reviewed and updated the history, physical exam, data, assessment, and plan of the note as detailed below so that it reflects my evaluation and management of the patient.       The patient's chart was reviewed prior to the visit as part of pre-visit planning  No chief complaint on file.     Problems:fu ADD, asthma    Doing well c/w meds :   IR adderall 10mg /d:hasnt been able to get from insurance    C/w AXR 40mg  once daily, concentration is doing ok, did better w 45mg  but $$$ issues   Denies any s/e   Denies diversion or st/drug abuse    Asthma sx have been a little worse recently: typical for this spring season  dtr recentyl dx w flu A  Rx: steroid inhaler: needs ; has albuterol for prn use        Controlled Substance Monitoring 04/28/2017 08/05/2017 11/11/2017 02/17/2018 06/08/2018 09/11/2018 12/11/2018   OARRS/eKASPER Status Reviewed Reviewed Reviewed Reviewed Reviewed Reviewed Reviewed   OARRS/eKASPER Consistent with Prescriber Expectation [name]     I have completed the required OARRS/eKASPER documentation for this patient on 12/11/2018.  Emrey Thornley [name]      Past Medical History:   Diagnosis Date   ??? ADD (attention deficit disorder)    ??? Anxiety    ??? Asthma    ??? Cervical dysplasia    ??? Chronic back pain    ??? Depression    ??? Diverticulitis    ??? Environmental allergies    ??? Iron deficiency    ??? Varicella    ??? Vitamin D deficiency        Current Outpatient Medications   Medication Sig Dispense Refill   ??? albuterol (PROAIR HFA) 90 mcg/actuation inhaler Inhale 2 puffs into the lungs every 6 hours as needed for Wheezing. 3 Inhaler 3   ??? cetirizine (ZYRTEC) 10 MG tablet Take 10 mg by mouth daily as needed for Allergies.     ??? cholecalciferol, vitamin D3, (VITAMIN D3) 5,000 unit Tab Take by mouth.     ??? dextroamphetamine-amphetamine (ADDERALL XR)  20 MG 24 hr capsule Take 2 capsules (40 mg total) by mouth every morning for 30 days. 60 capsule 0   ??? dextroamphetamine-amphetamine (ADDERALL XR) 20 MG 24 hr capsule Take 2 capsules (40 mg total) by mouth every morning for 30 days. 60 capsule 0   ??? escitalopram oxalate (LEXAPRO) 20 MG tablet TAKE 1 TABLET(20 MG) BY MOUTH DAILY 90 tablet 0   ??? FERROUS SULFATE (IRON ORAL) Take by mouth.     ??? fluticasone (FLONASE) 50 mcg/actuation nasal spray SHAKE LIQUID AND USE 1 TO 2 SPRAYS IN EACH NOSTRIL DAILY AS NEEDED 16 g 0   ??? fluticasone propion-salmeterol (ADVAIR DISKUS) 500-50 mcg/dose DsDv Inhale 1 puff into the lungs 2 times a day. 180 each 3   ??? melatonin 10 mg Cap Take by mouth.     ??? montelukast (SINGULAIR) 10 mg tablet Take 1 tablet (10 mg total) by mouth at bedtime. 90 tablet 3   ??? predniSONE (DELTASONE) 20 MG tablet 1 by mouth twice daily x 3 days then 1 by mouth once daily x 4 days 10 tablet 0     No  current facility-administered medications for this visit.           ROS:   Review of Systems  as above   Objective:   Physical Exam      Wt Readings from Last 3 Encounters:   09/11/18 159 lb (72.1 kg)   06/08/18 157 lb 4.8 oz (71.4 kg)   02/17/18 154 lb 3.2 oz (69.9 kg)       There were no vitals filed for this visit.  There is no height or weight on file to calculate BMI.  There is no height or weight on file to calculate BSA.    NAD  Soft wheezes postrly bilat  Psych: A&Ox3; reactive affect; no obv thought d/o       Lab Results   Component Value Date    WBC 7.9 09/30/2017    HGB 12.3 09/30/2017    HCT 37.3 09/30/2017    MCV 88.7 09/30/2017    PLT 229 09/30/2017       No results found for: HGBA1C  No components found for: GLUF,  MICROALBUR,  LDLCALC,  CREATININE    Lab Results   Component Value Date    GLUCOSE 107 (H) 09/30/2017       No results found for: [facility]       Health Maintenance Summary                Diabetes Screening Overdue 07-18-69     Depression Monitoring (PHQ-9) Overdue September 29, 1969      Comprehensive Physical Exam Overdue 1969/08/19     Alcohol Misuse Screening Overdue 06/19/1987     HIV Screening Overdue 06/19/1987     Immunization: DTaP/Tdap/Td Overdue 06/18/1988     Lipid Panel Overdue 06/18/2014     Cervical Cancer Screening/Pap Smear (MyChart) Next Due 05/13/2020          Immunization History   Administered Date(s) Administered   ??? Influenza Preservative Free Quadrivalent 08/06/2015, 06/25/2016, 09/11/2018   ??? Influenza Preservative Free Quadrivalent Intradermal 07/04/2017   ??? Pneumococcal Polysaccharide 10/04/2008   ??? influenza unspec 05/31/2008         Assessment/Plan:     255- 305    A/P:  1. ADD: continue with the current treatment   2. Asthma: $$ w advair; pulmoicort rx    RTC: 3 months    New meds:   none  Patient education given  none  Patient verbalized understanding & agreement of the proposed plan.

## 2018-12-16 NOTE — Unmapped (Signed)
Staff:  Patient is calling after-hours with the following acute concern:    The main reason for the visit/call: concern about COVID-19    1. Have you traveled to [place], [place], [place], [place], [place], [place], or [place], [place], or [place] state within 14 days of symptom onset? No  a. Any other travel: Traveled to [place] 2/25-2/29/20 (returned 14 days ago)    2. Have you had close contact with a laboratory-confirmed coronavirus patient within 14 days of symptom onset? No    3. Do you have cough, fever, or shortness of breath? fever, cough and shortness of breath    4. Do you feel your illness is severe or life threatening, for example is it affecting your ability to breathe? No      If NO to ALL 4 questions, okay to schedule sick visit.    If YES to #4 but NO to #1 and #2, Advise patient to call 911 or go to the ER    If YES to #4 and YES to #1 and/or #2, Advise patient to call 911 or go to the ER, Ask them to tell the 911 operator or call the hospital before arriving to tell them that they could have coronavirus. Report possible COVID case to 584-WASH    S/O:  Mary [name] is a 48 y.o. female briefly w/ PMH asthma calls with above c/o low-grade fever    Daughter was diagnosed with flu A last week, [employer] student came home after diagnosis. She hx asthma / congestion chronically. She was seen by PCP 12/11/18 (5 days ago) last week and started on Tamiflu as a precautionary measure. Tuesday she awoke with some difficulty breathing. She had a fever Thursday evening late of 99.5 approx. She has assoc fatigue. Today her fever is 99.1. She has some cough  Assoc wheezing, but breathing treatments help and she is improving.  She has assoc SOB, but feels this is not significant difficulty and at her baseline SOB.  Had flu shot this year.   Speaking very comfortably over the phone in full sentences. No wheezing or dyspnea heard in her voice.      A/P  Viral URI / mild asthma exacerbation - Reviewed with patient this  is likely viral URI. Reviewed that with domestic spread of COVID-19, this could be coronavirus illness (travel as above) or any another viral illness. Reviewed this could also be influenza, but given well over 48hr from symptoms onset, symptom improving with albuterol PRN, shared decision making to defer tamiflu at this time. Also reviewed that recent CDC recommends avoidance of steroids for possible COVID19 and mild disease due to weakening immune system and prolonged illness. Shared decision making to have supportive care with Albuterol MDI 6 puff BTB x 4 hr x28 hrs and space as able (avoid NEB to protect family members and avoid aerosolize unless she runs out of MDI). Strongly encouraged her to call back if she feels symptoms worsen in any way and present to ED if she has severe dyspnea.    Reviewed the importance of self-quarantine until symptoms resolve and appropriate hand washing, especially if living with others. Reviewed the COVID-19 situation in our community is evolving daily, and per 584-WASH as of 12/16/18, only high risk exposure cases (contact with lab confirmed COVID-19 and/or travel to above countries) warrants contacting [facility].  [facility] will have capabilities for COVID19 testing next week for patients with symptoms and high risk of exposure. Encouraged patient to call next week  if symptoms still present to see if appropriate for COVID-19 testing. Reviewed supportive care and HOME CARE advice below for viral URI and reasons to call back and/or seek ED level care, including severe shortness of breath / lethargy.     All of her questions answered.    Providers:  ??? If high risk for COVID Exposure (YES to #1 or #2), Ask patient to remain at home and call the [facility] ([phone]) to see if they need testing for coronavirus  ??? Try to minimize URI office visits by relaying 'HOME CARE ADVICE' message to the patient  ??? If providing prescription treatment over the phone, let them know that in  office evaluation would be ideal instead of treating over the phone and that we will return to that norm after this global outbreak    HOME CARE ADVICE:  You are at low risk for having coronavirus at this time and it doesn't sound like you need to be in the hospital.  Following guidance from the [facility] and the [place] Department of Health, we are recommending that all patients with mild to moderate respiratory symptoms stay at home and practice self-care and monitoring instead of being seen in the office. This is an evolving situation and recommendations will change as more information becomes available. Thank you for your patience as we continue to monitor the situation.    Our recommendations for all of our patients are:  ??? Stay home if you are sick.  ??? Avoid touching your face.  ??? Wash your hands frequently.  ??? Avoid close contact with others.  ??? Stay well hydrated.  ??? Take over the counter medication as needed for symptom relief.  ??? If you develop significant breathing difficulty consider going to the ER for evaluation.

## 2018-12-16 NOTE — Unmapped (Signed)
Patient Name : Mary [name], Mary [name]    Patient Date of Birth: 05-15-1971    Relationship of Caller to Patient: Self [phone]    Patient of: Dr. Isidoro Donning of Call: Symptomatic for COVID 19.    On Call Provider Contacted: Dr. Carron Curie     Provider contacted via pager or cell Cell phone [phone]    Time and Method: Direct connected patient to doctor at 14:12. Verified doctor receipt of routing chart.    Caller advised to call back in 30 minutes, if they do not hear from the provider on-call.  Note was routed to the receiving on-call provider/pool.

## 2018-12-29 ENCOUNTER — Ambulatory Visit: Admit: 2018-12-29 | Discharge: 2019-01-09 | Payer: PRIVATE HEALTH INSURANCE

## 2018-12-29 DIAGNOSIS — J4541 Moderate persistent asthma with (acute) exacerbation: Secondary | ICD-10-CM

## 2018-12-29 MED ORDER — albuterol (PROAIR HFA) 90 mcg/actuation inhaler
90 | Freq: Four times a day (QID) | RESPIRATORY_TRACT | 3 refills | Status: AC | PRN
Start: 2018-12-29 — End: 2022-03-23

## 2018-12-29 MED ORDER — albuterol (PROAIR HFA) 90 mcg/actuation inhaler
90 | Freq: Four times a day (QID) | RESPIRATORY_TRACT | 3 refills | Status: AC | PRN
Start: 2018-12-29 — End: 2019-11-12

## 2018-12-29 MED ORDER — montelukast (SINGULAIR) 10 mg tablet
10 | ORAL_TABLET | Freq: Every evening | ORAL | 3 refills | Status: AC
Start: 2018-12-29 — End: 2018-12-29

## 2018-12-29 MED ORDER — montelukast (SINGULAIR) 10 mg tablet
10 | ORAL_TABLET | Freq: Every evening | ORAL | 3 refills | Status: AC
Start: 2018-12-29 — End: 2019-11-12

## 2018-12-29 MED ORDER — fluticasone propion-salmeteroL (ADVAIR DISKUS) 500-50 mcg/dose DsDv
500-50 | Freq: Two times a day (BID) | RESPIRATORY_TRACT | 11 refills | Status: AC
Start: 2018-12-29 — End: 2019-11-12

## 2018-12-29 NOTE — Unmapped (Signed)
[facility]  [facility]  [address]  [place] [address]    The patient's chart was reviewed prior to this ACUTE VISIT as part of pre-visit planning    Fever : tmax 99.5; intermittent; + fatigue  x2 weeks: variable  Sick contacts: dtr w flu  4d ago: severe diarrhea x 2  Chest feels tight past few days  Now also sore throat, headache  Past 2d also feeling a little out of breath, since lungs feel on fire  Tx: albuterol: inhaler 6 puffs every 4-6h prn as adv by oncall   Pulmicort: wasn't able to get filled, ?not covered by insurance;    Prednisone: hasnt started yet    Spo2: doesn't         1. Have you been around someone who has traveled internationally within the last 14 days? No  2. If yes, do you have a fever or flu symptoms (cough, sore throat, runny nose, body aches, etc.)? NA  3. Have you developed any new flu symptoms (cough, sore throat, runny nose, body aches, etc.) see above   If patient answered yes to 2 and/or 3 please provide details about symptoms related to these questions.       Past Medical History:   Diagnosis Date   ??? ADD (attention deficit disorder)    ??? Anxiety    ??? Asthma    ??? Cervical dysplasia    ??? Chronic back pain    ??? Depression    ??? Diverticulitis    ??? Environmental allergies    ??? Iron deficiency    ??? Varicella    ??? Vitamin D deficiency         Current Outpatient Medications   Medication Sig Dispense Refill   ??? albuterol (PROAIR HFA) 90 mcg/actuation inhaler Inhale 2 puffs into the lungs every 6 hours as needed for Wheezing. 3 Inhaler 3   ??? budesonide (PULMICORT FLEXHALER) 180 mcg/actuation flexhaler Inhale 1 puff into the lungs 2 times a day. 1 Inhaler 5   ??? cetirizine (ZYRTEC) 10 MG tablet Take 10 mg by mouth daily as needed for Allergies.     ??? cholecalciferol, vitamin D3, (VITAMIN D3) 5,000 unit Tab Take by mouth.     ??? dextroamphetamine-amphetamine (ADDERALL XR) 20 MG 24 hr capsule Take 2 capsules (40 mg total) by mouth every morning for  30 days. 60 capsule 0   ??? [START ON 01/10/2019] dextroamphetamine-amphetamine (ADDERALL XR) 20 MG 24 hr capsule Take 2 capsules (40 mg total) by mouth every morning for 30 days. 60 capsule 0   ??? [START ON 02/09/2019] dextroamphetamine-amphetamine (ADDERALL XR) 20 MG 24 hr capsule Take 2 capsules (40 mg total) by mouth every morning for 30 days. 60 capsule 0   ??? escitalopram oxalate (LEXAPRO) 20 MG tablet TAKE 1 TABLET(20 MG) BY MOUTH DAILY 90 tablet 0   ??? FERROUS SULFATE (IRON ORAL) Take by mouth.     ??? fluticasone (FLONASE) 50 mcg/actuation nasal spray SHAKE LIQUID AND USE 1 TO 2 SPRAYS IN EACH NOSTRIL DAILY AS NEEDED 16 g 0   ??? melatonin 10 mg Cap Take by mouth.     ??? montelukast (SINGULAIR) 10 mg tablet Take 1 tablet (10 mg total) by mouth at bedtime. 90 tablet 3   ??? predniSONE (DELTASONE) 20 MG tablet 1 by mouth twice daily x 3 days then 1 by mouth once daily x 4 days 10 tablet 0  No current facility-administered medications for this visit.        Review of systems:  As above    On Exam:  There were no vitals filed for this visit.    Wt Readings from Last 3 Encounters:   12/11/18 162 lb (73.5 kg)   09/11/18 159 lb (72.1 kg)   06/08/18 157 lb 4.8 oz (71.4 kg)         No results found for: HGBA1C    Lab Results   Component Value Date    GLUCOSE 107 (H) 09/30/2017    BUN 7 09/30/2017    CO2 25 09/30/2017    CREATININE 0.71 09/30/2017    K 3.7 09/30/2017    NA 135 09/30/2017    CL 100 09/30/2017    CALCIUM 9.1 09/30/2017    ALBUMIN 4.0 09/30/2017    PROT 7.3 09/30/2017    ALKPHOS 55 09/30/2017    ALT 23 09/30/2017    AST 30 09/30/2017    BILITOT 0.5 09/30/2017       No results found for: LIPIDCOMM, CHOLTOT, TRIG, HDL, CHOLHDL, LDL    Health Maintenance Summary                Hepatitis C Screening (MyChart) Overdue [birth year] 02, 1969     Diabetes Screening Overdue 02-10-1969     Depression Monitoring (PHQ-9) Overdue 07/04/1969     Comprehensive Physical Exam Overdue 02/22/1969     Alcohol Misuse Screening Overdue 06/19/1987      HIV Screening Overdue 06/19/1987     Immunization: DTaP/Tdap/Td Overdue 06/18/1988     Lipid Panel Overdue 06/18/2014     Cervical Cancer Screening/Pap Smear (MyChart) Next Due 05/13/2020            Immunization History   Administered Date(s) Administered   ??? Influenza Preservative Free Quadrivalent 08/06/2015, 06/25/2016, 09/11/2018   ??? Influenza Preservative Free Quadrivalent Intradermal 07/04/2017   ??? Pneumococcal Polysaccharide 10/04/2008   ??? influenza unspec 05/31/2008            20 mins,   This was a phone conversation in lieu of an in-person visit. The patient provided verbal consent for an over the phone visit.    I spent 15-24 minutes (level 99213 TEL) on this call conducting an interview, performing a limited exam by phone, and educating the patient on my assessment and plan.      A/P:  Patient Instructions   Asthma with viral symptoms  - prednisone: go ahead and start; continue albuterol as needed  - do check with the insurance about the steroid inhaler and let us know: this may help a little with the current symptoms but is also needed as your maintenance medicine  - do get a pulse oximeter  - as you are feeling out of breath, having a low grade fever and chest burning, there is a concern of coronavirus and could you need to get treated in the hospital (needing oxygen for example); if not feeling any better by tomorrow, especially if feeling worse or the home pulse oximeter is < 90%, then do come to the ER to get this evaluated       RTC: No follow-ups on file.     New meds/Patient education given  : see AVS  Patient verbalized understanding & agreement of the proposed plan.

## 2018-12-29 NOTE — Unmapped (Addendum)
Asthma with viral symptoms  - prednisone: go ahead and start; continue albuterol as needed  - do check with the insurance about the steroid inhaler and let us know: this may help a little with the current symptoms but is also needed as your maintenance medicine  - do get a pulse oximeter  - as you are feeling out of breath, having a low grade fever and chest burning, there is a concern of coronavirus and could you need to get treated in the hospital (needing oxygen for example); if not feeling any better by tomorrow, especially if feeling worse or the home pulse oximeter is < 90%, then do come to the ER to get this evaluated    As you are aware, coronavirus is causing viral symptoms: usually fever, dry cough, trouble breathing. At present the CDC is not recommending for most mild cases to have testing. But if you feel the more serious symptoms are developing then getting testing is reasonable. Recently, testing has been restricted to just ER and hospital patients due to the national shortage of testing materials. For now the emphasis is on rest, push fluids, practice safe hand hygiene and maintain social distancing. Treat the symptoms as needed, 1 exception use Extra Strength Tylenol (rather than ibuprofen or any NSAID's) as needed for fever or pain (up to 3000mg /d)

## 2019-01-15 MED ORDER — dextroamphetamine-amphetamine (ADDERALL XR) 20 MG 24 hr capsule
20 | ORAL_CAPSULE | Freq: Every morning | ORAL | 0 refills | Status: AC
Start: 2019-01-15 — End: 2019-03-05

## 2019-01-15 NOTE — Unmapped (Signed)
Updating pts pharmacy.    Closing note.

## 2019-01-22 MED ORDER — escitalopram oxalate (LEXAPRO) 20 MG tablet
20 | ORAL_TABLET | Freq: Every day | ORAL | 3 refills | Status: AC
Start: 2019-01-22 — End: 2019-11-12

## 2019-03-05 ENCOUNTER — Ambulatory Visit: Admit: 2019-03-05 | Payer: PRIVATE HEALTH INSURANCE

## 2019-03-05 DIAGNOSIS — F988 Other specified behavioral and emotional disorders with onset usually occurring in childhood and adolescence: Secondary | ICD-10-CM

## 2019-03-05 MED ORDER — dextroamphetamine-amphetamine (ADDERALL XR) 20 MG 24 hr capsule
20 | ORAL_CAPSULE | Freq: Every morning | ORAL | 0 refills | Status: AC
Start: 2019-03-05 — End: 2019-05-16

## 2019-03-05 NOTE — Unmapped (Addendum)
Asthma   - continue with the current treatment   - for cost issues may be cheaper to switch advair to airduo, do check on goodrx and let us know if need to change    ADD  - doing well  - continue with the current treatment      Annual wellness labs:  12 hours FASTING for blood tests: take the order sheet to the [facility] lab in our building (if you prefer you can also take to another lab, like [facility] or [facility])    Our lab is usually open Monday-Friday from 7 a.m. to 6 p.m and Saturdays from 7:30 am- 1 pm BUT during this coronavirus time our lab will only be open Monday-Friday from 730 a.m. to 430 p.m and closed on Saturday

## 2019-03-05 NOTE — Unmapped (Signed)
Subjective   HPI:   Patient ID: Mary [name] is a 48 y.o. female.    The following HPI was reviewed and copied forward (with edits) from a note written by me on 12/11/18. I have reviewed and updated the history, physical exam, data, assessment, and plan of the note as detailed below so that it reflects my evaluation and management of the patient.       The patient's chart was reviewed prior to the visit as part of pre-visit planning  No chief complaint on file.     Problems:fu ADD, asthma    Doing well c/w meds :   IR adderall 10mg /d:hasnt been able to get from insurance    C/w AXR 40mg  once daily, concentration is doing ok, did better w 45mg  but $$$ issues   Denies any s/e   Denies diversion or st/drug abuse    Asthma: doing better  Is now on advair, but cost issues  Also taking singulair  Uses albuterol prn            Controlled Substance Monitoring 04/28/2017 08/05/2017 11/11/2017 02/17/2018 06/08/2018 09/11/2018 12/11/2018   OARRS/eKASPER Status Reviewed Reviewed Reviewed Reviewed Reviewed Reviewed Reviewed   OARRS/eKASPER Consistent with Prescriber Expectation [name]     I have completed the required OARRS/eKASPER documentation for this patient on 03/04/2019.  Solon Alban [name]      Past Medical History:   Diagnosis Date   ??? ADD (attention deficit disorder)    ??? Anxiety    ??? Asthma    ??? Cervical dysplasia    ??? Chronic back pain    ??? Depression    ??? Diverticulitis    ??? Environmental allergies    ??? Iron deficiency    ??? Varicella    ??? Vitamin D deficiency        Current Outpatient Medications   Medication Sig Dispense Refill   ??? albuterol (PROAIR HFA) 90 mcg/actuation inhaler Inhale 2 puffs into the lungs every 6 hours as needed for Wheezing. 3 Inhaler 3   ??? albuterol (PROAIR HFA) 90 mcg/actuation inhaler Inhale 2 puffs into the lungs every 6 hours as needed for Wheezing. 3 Inhaler 3   ??? cetirizine (ZYRTEC) 10 MG tablet Take 10 mg by mouth daily as needed for Allergies.     ??? cholecalciferol, vitamin D3, (VITAMIN D3)  5,000 unit Tab Take by mouth.     ??? dextroamphetamine-amphetamine (ADDERALL XR) 20 MG 24 hr capsule Take 2 capsules (40 mg total) by mouth every morning for 30 days. 60 capsule 0   ??? dextroamphetamine-amphetamine (ADDERALL XR) 20 MG 24 hr capsule Take 2 capsules (40 mg total) by mouth every morning for 30 days. 60 capsule 0   ??? dextroamphetamine-amphetamine (ADDERALL XR) 20 MG 24 hr capsule Take 2 capsules (40 mg total) by mouth every morning for 30 days. 60 capsule 0   ??? escitalopram oxalate (LEXAPRO) 20 MG tablet Take 1 tablet (20 mg total) by mouth daily. 90 tablet 3   ??? FERROUS SULFATE (IRON ORAL) Take by mouth.     ??? fluticasone (FLONASE) 50 mcg/actuation nasal spray SHAKE LIQUID AND USE 1 TO 2 SPRAYS IN EACH NOSTRIL DAILY AS NEEDED 16 g 0   ??? fluticasone propion-salmeteroL (ADVAIR DISKUS) 500-50 mcg/dose DsDv Inhale 1 puff into the lungs 2 times a day. 1 each 11   ??? melatonin 10 mg Cap Take by mouth.     ??? montelukast (SINGULAIR) 10 mg tablet  Take 1 tablet (10 mg total) by mouth at bedtime. 90 tablet 3   ??? predniSONE (DELTASONE) 20 MG tablet 1 by mouth twice daily x 3 days then 1 by mouth once daily x 4 days 10 tablet 0     No current facility-administered medications for this visit.           ROS:   Review of Systems  as above   Objective:   Physical Exam      Wt Readings from Last 3 Encounters:   12/11/18 162 lb (73.5 kg)   09/11/18 159 lb (72.1 kg)   06/08/18 157 lb 4.8 oz (71.4 kg)       There were no vitals filed for this visit.  There is no height or weight on file to calculate BMI.  There is no height or weight on file to calculate BSA.    NAD  Psych: A&Ox3; reactive affect; no obv thought d/o       Lab Results   Component Value Date    WBC 7.9 09/30/2017    HGB 12.3 09/30/2017    HCT 37.3 09/30/2017    MCV 88.7 09/30/2017    PLT 229 09/30/2017       No results found for: HGBA1C  No components found for: GLUF,  MICROALBUR,  LDLCALC,  CREATININE    Lab Results   Component Value Date    GLUCOSE 107 (H)  09/30/2017       No results found for: [facility]       Health Maintenance Summary                Hepatitis C Screening (MyChart) Overdue 10/30/1968     Diabetes Screening Overdue 09/19/1969     Depression Monitoring (PHQ-9) Overdue 09-21-1969     Comprehensive Physical Exam Overdue 02-12-1969     Alcohol Misuse Screening Overdue 06/19/1987     HIV Screening Overdue 06/19/1987     Immunization: DTaP/Tdap/Td Overdue 06/18/1988     Lipid Panel Overdue 06/18/2014     Cervical Cancer Screening/Pap Smear (MyChart) Next Due 05/13/2020          Immunization History   Administered Date(s) Administered   ??? Influenza Preservative Free Quadrivalent 08/06/2015, 06/25/2016, 09/11/2018   ??? Influenza Preservative Free Quadrivalent Intradermal 07/04/2017   ??? Pneumococcal Polysaccharide 10/04/2008   ??? influenza unspec 05/31/2008         Assessment/Plan:     116-124    This was a Phone conversation, in lieu of an in-person visit. The patient provided verbal consent to participate in the telehealth visit.   I spent 8 minutes speaking with the patient, conducting an interview, performing a limited exam, and educating the patient on my assessment and plan. I also spent 3 minutes, on the same day as the encounter, preparing to see the patient (eg, review of tests).       A/P:  Patient Instructions   Asthma   - continue with the current treatment   - for cost issues may be cheaper to switch advair to airduo, do check on goodrx and let us know if need to change    ADD  - doing well  - continue with the current treatment         RTC: 3 months    New meds:   none  Patient education given  none  Patient verbalized understanding & agreement of the proposed plan.

## 2019-05-16 ENCOUNTER — Ambulatory Visit: Admit: 2019-05-16 | Payer: PRIVATE HEALTH INSURANCE

## 2019-05-16 DIAGNOSIS — F988 Other specified behavioral and emotional disorders with onset usually occurring in childhood and adolescence: Secondary | ICD-10-CM

## 2019-05-16 MED ORDER — dextroamphetamine-amphetamine (ADDERALL XR) 20 MG 24 hr capsule
20 | ORAL_CAPSULE | Freq: Every morning | ORAL | 0 refills | Status: AC
Start: 2019-05-16 — End: 2019-08-16

## 2019-05-16 MED ORDER — predniSONE (DELTASONE) 20 MG tablet
20 | ORAL_TABLET | ORAL | 0 refills | Status: AC
Start: 2019-05-16 — End: 2019-08-16

## 2019-05-16 NOTE — Unmapped (Signed)
Asthma   - continue with the current treatment ; safety net rx for prednisone sent to pharmacy      ADD  - doing well  - continue with the current treatment  : note on prescription to allow to fill august rx earlier as going out of town soon

## 2019-05-16 NOTE — Unmapped (Signed)
Subjective   HPI:   Patient ID: Mary [name] is a 48 y.o. female.    The following HPI was reviewed and copied forward (with edits) from a note written by me on 12/11/18. I have reviewed and updated the history, physical exam, data, assessment, and plan of the note as detailed below so that it reflects my evaluation and management of the patient.       The patient's chart was reviewed prior to the visit as part of pre-visit planning  Chief Complaint   Patient presents with   ??? ADHD      Problems:fu ADD, asthma    The following HPI was reviewed and copied forward (with edits) from a note written by me on 03/05/19. I have reviewed and updated the history, physical exam, data, assessment, and plan of the note as detailed below so that it reflects my evaluation and management of the patient.     Doing well c/w meds :   C/w AXR 40mg  once daily, concentration is doing ok, did better w 45mg  but $$$ issues   Denies any s/e   Denies diversion or st/drug abuse    Asthma: doing well  Is now on advair+ singulair  No pred use since the last visit 3 months ago  Uses albuterol prn    Controlled Substance Monitoring 11/11/2017 02/17/2018 06/08/2018 09/11/2018 12/11/2018 03/05/2019 05/16/2019   OARRS/eKASPER Status Reviewed Reviewed Reviewed Reviewed Reviewed Reviewed Reviewed   OARRS/eKASPER Consistent with Prescriber Expectation Mary [name]     I have completed the required OARRS/eKASPER documentation for this patient on 05/16/2019.  Mary [name]      Past Medical History:   Diagnosis Date   ??? ADD (attention deficit disorder)    ??? Anxiety    ??? Asthma    ??? Cervical dysplasia    ??? Chronic back pain    ??? Depression    ??? Diverticulitis    ??? Environmental allergies    ??? Iron deficiency    ??? Varicella    ??? Vitamin D deficiency        Current Outpatient Medications   Medication Sig Dispense Refill   ??? albuterol (PROAIR HFA) 90 mcg/actuation inhaler Inhale 2 puffs into the lungs every 6 hours as needed for Wheezing. 3 Inhaler 3   ??? albuterol  (PROAIR HFA) 90 mcg/actuation inhaler Inhale 2 puffs into the lungs every 6 hours as needed for Wheezing. 3 Inhaler 3   ??? cetirizine (ZYRTEC) 10 MG tablet Take 10 mg by mouth daily as needed for Allergies.     ??? cholecalciferol, vitamin D3, (VITAMIN D3) 5,000 unit Tab Take by mouth.     ??? [START ON 07/15/2019] dextroamphetamine-amphetamine (ADDERALL XR) 20 MG 24 hr capsule Take 2 capsules (40 mg total) by mouth every morning for 30 days. 60 capsule 0   ??? escitalopram oxalate (LEXAPRO) 20 MG tablet Take 1 tablet (20 mg total) by mouth daily. 90 tablet 3   ??? FERROUS SULFATE (IRON ORAL) Take by mouth.     ??? fluticasone (FLONASE) 50 mcg/actuation nasal spray SHAKE LIQUID AND USE 1 TO 2 SPRAYS IN EACH NOSTRIL DAILY AS NEEDED 16 g 0   ??? fluticasone propion-salmeteroL (ADVAIR DISKUS) 500-50 mcg/dose DsDv Inhale 1 puff into the lungs 2 times a day. 1 each 11   ??? melatonin 10 mg Cap Take by mouth.     ??? montelukast (SINGULAIR) 10 mg tablet Take 1 tablet (10 mg total) by  mouth at bedtime. 90 tablet 3   ??? [START ON 06/15/2019] dextroamphetamine-amphetamine (ADDERALL XR) 20 MG 24 hr capsule Take 2 capsules (40 mg total) by mouth every morning for 30 days. 60 capsule 0   ??? dextroamphetamine-amphetamine (ADDERALL XR) 20 MG 24 hr capsule Take 2 capsules (40 mg total) by mouth every morning for 30 days. 60 capsule 0   ??? predniSONE (DELTASONE) 20 MG tablet 1 by mouth twice daily x 3 days then 1 by mouth once daily x 4 days 10 tablet 0     No current facility-administered medications for this visit.           ROS:   Review of Systems  as above   Objective:   Physical Exam      Wt Readings from Last 3 Encounters:   12/11/18 162 lb (73.5 kg)   09/11/18 159 lb (72.1 kg)   06/08/18 157 lb 4.8 oz (71.4 kg)       There were no vitals filed for this visit.  There is no height or weight on file to calculate BMI.  There is no height or weight on file to calculate BSA.    NAD  Psych: A&Ox3; reactive affect; no obv thought d/o       Lab Results    Component Value Date    WBC 7.9 09/30/2017    HGB 12.3 09/30/2017    HCT 37.3 09/30/2017    MCV 88.7 09/30/2017    PLT 229 09/30/2017       No results found for: HGBA1C  No components found for: GLUF,  MICROALBUR,  LDLCALC,  CREATININE    Lab Results   Component Value Date    GLUCOSE 107 (H) 09/30/2017       No results found for: [facility]       Health Maintenance Summary                Hepatitis C Screening (MyChart) Overdue [birth year] 12, 1969     Diabetes Screening Overdue Oct 10, 1968     Depression Monitoring (PHQ-9) Overdue October 23, 1968     Comprehensive Physical Exam Overdue 1969/03/26     Alcohol Misuse Screening Overdue 06/19/1987     HIV Screening Overdue 06/19/1987     Immunization: DTaP/Tdap/Td Overdue 06/18/1988     Lipid Panel Overdue 06/18/2014     Immunization: Influenza Next Due 07/05/2019     Cervical Cancer Screening/Pap Smear (MyChart) Next Due 05/13/2020          Immunization History   Administered Date(s) Administered   ??? Influenza Preservative Free Quadrivalent 08/06/2015, 06/25/2016, 09/11/2018   ??? Influenza Preservative Free Quadrivalent Intradermal 07/04/2017   ??? Pneumococcal Polysaccharide 10/04/2008   ??? influenza unspec 05/31/2008         Assessment/Plan:     This was a Phone conversation, in lieu of an in-person visit. The patient provided verbal consent to participate in the telehealth visit.   I spent 8 minutes speaking with the patient, conducting an interview, performing a limited exam, and educating the patient on my assessment and plan. I also spent 3 minutes, on the same day as the encounter, preparing to see the patient (eg, review of tests) and ordering medications, tests, or procedures.       A/P:  Patient Instructions   Asthma   - continue with the current treatment ; safety net rx for prednisone sent to pharmacy      ADD  - doing well  - continue with the current treatment  :  note on prescription to allow to fill august rx earlier as going out of town soon           RTC: 3 months    New meds:    none  Patient education given  none  Patient verbalized understanding & agreement of the proposed plan.

## 2019-05-28 ENCOUNTER — Ambulatory Visit: Payer: PRIVATE HEALTH INSURANCE

## 2019-08-16 ENCOUNTER — Ambulatory Visit: Admit: 2019-08-16 | Discharge: 2019-08-16 | Payer: PRIVATE HEALTH INSURANCE

## 2019-08-16 DIAGNOSIS — R0989 Other specified symptoms and signs involving the circulatory and respiratory systems: Secondary | ICD-10-CM

## 2019-08-16 MED ORDER — dextroamphetamine-amphetamine (ADDERALL XR) 20 MG 24 hr capsule
20 | ORAL_CAPSULE | Freq: Every morning | ORAL | 0 refills | Status: AC
Start: 2019-08-16 — End: 2019-11-12

## 2019-08-16 MED ORDER — predniSONE (DELTASONE) 20 MG tablet
20 | ORAL_TABLET | ORAL | 0 refills | Status: AC
Start: 2019-08-16 — End: 2021-10-27

## 2019-08-16 NOTE — Unmapped (Signed)
1. Asthma  - possible covid infection: test ordered  - prednisone; Continue home medications    2. ADD:  - stable; continue with the current treatment ; Prescription(s) were electronically prescribed today    3. Fall with foot and elbow injuries ~ 2 months ago  - if covid test negative get xrays

## 2019-08-16 NOTE — Unmapped (Signed)
Subjective   HPI:   Patient ID: Mary [name] is a 48 y.o. female.    The following HPI was reviewed and copied forward (with edits) from a note written by me on 12/11/18. I have reviewed and updated the history, physical exam, data, assessment, and plan of the note as detailed below so that it reflects my evaluation and management of the patient.       The patient's chart was reviewed prior to the visit as part of pre-visit planning  No chief complaint on file.     Problems:fu ADD, asthma    The following HPI was reviewed and copied forward (with edits) from a note written by me on 05/16/19. I have reviewed and updated the history, physical exam, data, assessment, and plan of the note as detailed below so that it reflects my evaluation and management of the patient.       Feel terrible, sean sick (spouse)  Past 2d inc chest congestion sinus pressure, tiredness   Chest sx r similar to asthma sx: tight, occl sputum; not much wheezing    Doing well c/w meds :   C/w AXR 40mg  once daily, concentration is doing ok, did better w 45mg  but $$$ issues   Denies any s/e   Denies diversion or st/drug abuse    Asthma  Is now on advair+ singulair  No pred use since the last visit 3 months ago  Uses albuterol prn: not helping much w above  spo2 normal    Controlled Substance Monitoring 02/17/2018 06/08/2018 09/11/2018 12/11/2018 03/05/2019 05/16/2019 08/16/2019   OARRS/eKASPER Status Reviewed Reviewed Reviewed Reviewed Reviewed Reviewed Reviewed   OARRS/eKASPER Consistent with Prescriber Expectation [name]     I have completed the required OARRS/eKASPER documentation for this patient on 08/16/2019.  Daylyn Azbill [name]      Past Medical History:   Diagnosis Date   ??? ADD (attention deficit disorder)    ??? Anxiety    ??? Asthma    ??? Cervical dysplasia    ??? Chronic back pain    ??? Depression    ??? Diverticulitis    ??? Environmental allergies    ??? Iron deficiency    ??? Varicella    ??? Vitamin D deficiency        Current Outpatient Medications    Medication Sig Dispense Refill   ??? albuterol (PROAIR HFA) 90 mcg/actuation inhaler Inhale 2 puffs into the lungs every 6 hours as needed for Wheezing. 3 Inhaler 3   ??? albuterol (PROAIR HFA) 90 mcg/actuation inhaler Inhale 2 puffs into the lungs every 6 hours as needed for Wheezing. 3 Inhaler 3   ??? cetirizine (ZYRTEC) 10 MG tablet Take 10 mg by mouth daily as needed for Allergies.     ??? cholecalciferol, vitamin D3, (VITAMIN D3) 5,000 unit Tab Take by mouth.     ??? [START ON 10/15/2019] dextroamphetamine-amphetamine (ADDERALL XR) 20 MG 24 hr capsule Take 2 capsules (40 mg total) by mouth every morning for 30 days. 60 capsule 0   ??? [START ON 09/15/2019] dextroamphetamine-amphetamine (ADDERALL XR) 20 MG 24 hr capsule Take 2 capsules (40 mg total) by mouth every morning for 30 days. 60 capsule 0   ??? dextroamphetamine-amphetamine (ADDERALL XR) 20 MG 24 hr capsule Take 2 capsules (40 mg total) by mouth every morning for 30 days. 60 capsule 0   ??? escitalopram oxalate (LEXAPRO) 20 MG tablet Take 1 tablet (20 mg total) by mouth daily. 90  tablet 3   ??? FERROUS SULFATE (IRON ORAL) Take by mouth.     ??? fluticasone (FLONASE) 50 mcg/actuation nasal spray SHAKE LIQUID AND USE 1 TO 2 SPRAYS IN EACH NOSTRIL DAILY AS NEEDED 16 g 0   ??? fluticasone propion-salmeteroL (ADVAIR DISKUS) 500-50 mcg/dose DsDv Inhale 1 puff into the lungs 2 times a day. 1 each 11   ??? melatonin 10 mg Cap Take by mouth.     ??? montelukast (SINGULAIR) 10 mg tablet Take 1 tablet (10 mg total) by mouth at bedtime. 90 tablet 3   ??? predniSONE (DELTASONE) 20 MG tablet 1 by mouth twice daily x 3 days then 1 by mouth once daily x 4 days 10 tablet 0     No current facility-administered medications for this visit.           ROS:   Review of Systems  as above   Objective:   Physical Exam      Wt Readings from Last 3 Encounters:   12/11/18 162 lb (73.5 kg)   09/11/18 159 lb (72.1 kg)   06/08/18 157 lb 4.8 oz (71.4 kg)       There were no vitals filed for this visit.  There is  no height or weight on file to calculate BMI.  There is no height or weight on file to calculate BSA.    NAD  Psych: A&Ox3; reactive affect; no obv thought d/o       Lab Results   Component Value Date    WBC 7.9 09/30/2017    HGB 12.3 09/30/2017    HCT 37.3 09/30/2017    MCV 88.7 09/30/2017    PLT 229 09/30/2017       No results found for: HGBA1C  No components found for: GLUF,  MICROALBUR,  LDLCALC,  CREATININE    Lab Results   Component Value Date    GLUCOSE 107 (H) 09/30/2017       No results found for: [facility]       Health Maintenance Summary                Hepatitis C Screening (MyChart) Overdue 1971-01-11     Diabetes Screening Overdue 01/15/1969     Depression Monitoring (PHQ-9) Overdue 05/09/69     Comprehensive Physical Exam Overdue Feb 17, 1969     Alcohol Misuse Screening Overdue 06/19/1987     HIV Screening Overdue 06/19/1987     Immunization: DTaP/Tdap/Td Overdue 06/18/1988     Lipid Panel Overdue 06/18/2014     Immunization: Influenza Overdue 06/05/2019     Colon Cancer Screening / Stool Testing Overdue 06/19/2019     Immunization: Zoster Overdue 06/19/2019     Mammogram (MyChart) Next Due 06/19/2019     Colon Cancer Screening / Colonoscopy (MyChart) Next Due 06/19/2019     Cervical Cancer Screening/Pap Smear (MyChart) Next Due 05/13/2020          Immunization History   Administered Date(s) Administered   ??? Influenza Preservative Free Quadrivalent 08/06/2015, 06/25/2016, 09/11/2018   ??? Influenza Preservative Free Quadrivalent Intradermal 07/04/2017   ??? Pneumococcal Polysaccharide 10/04/2008   ??? influenza unspec 05/31/2008         Assessment/Plan:     This was a Video visit, including two-way audio and video communication, in lieu of an in-person visit. The patient provided verbal consent to participate in the telehealth visit.   I spent 12 minutes speaking with the patient, conducting an interview, performing a limited exam, and educating the  patient on my assessment and plan. I also spent 3 minutes, on the same  day as the encounter, preparing to see the patient (eg, review of tests), ordering medications, tests, or procedures and documenting clinical information in the electronic or other health record.           A/P:  Patient Instructions   1. Asthma  - possible covid infection: test ordered  - prednisone; Continue home medications    2. ADD:  - stable; continue with the current treatment ; Prescription(s) were electronically prescribed today    3. Fall with foot and elbow injuries ~ 2 months ago  - if covid test negative get xrays       RTC: 3 months    New meds:   none  Patient education given  none  Patient verbalized understanding & agreement of the proposed plan.

## 2019-08-19 ENCOUNTER — Other Ambulatory Visit: Admit: 2019-08-19 | Payer: PRIVATE HEALTH INSURANCE

## 2019-08-19 ENCOUNTER — Institutional Professional Consult (permissible substitution): Admit: 2019-08-19 | Discharge: 2019-08-28 | Payer: PRIVATE HEALTH INSURANCE

## 2019-08-19 DIAGNOSIS — R0989 Other specified symptoms and signs involving the circulatory and respiratory systems: Secondary | ICD-10-CM

## 2019-08-19 DIAGNOSIS — U071 COVID-19: Secondary | ICD-10-CM

## 2019-08-19 LAB — 2019 NOVEL CORONAVIRUS (COVID-19), NAA-B
Date of Symptom Onset: 20201110
SARS-CoV-2: DETECTED

## 2019-08-19 NOTE — Unmapped (Signed)
Covid-19 nasopharyngeal specimen collected.

## 2019-08-20 NOTE — Telephone Encounter (Signed)
Call to patient  covid +  Also spouse test +  Feeling better, i'm ok  No fever and o2 level stable  Does have cough w some sputum and wheezing  C/w pred and prn albuterol, doesn't feel she is overusing it

## 2019-11-12 ENCOUNTER — Ambulatory Visit: Admit: 2019-11-12 | Discharge: 2019-11-12 | Payer: PRIVATE HEALTH INSURANCE

## 2019-11-12 DIAGNOSIS — F988 Other specified behavioral and emotional disorders with onset usually occurring in childhood and adolescence: Secondary | ICD-10-CM

## 2019-11-12 MED ORDER — montelukast (SINGULAIR) 10 mg tablet
10 | ORAL_TABLET | Freq: Every evening | ORAL | 3 refills | Status: AC
Start: 2019-11-12 — End: 2020-01-25

## 2019-11-12 MED ORDER — albuterol (PROAIR HFA) 90 mcg/actuation Inhl inhaler
90 | Freq: Four times a day (QID) | RESPIRATORY_TRACT | 3 refills | Status: AC | PRN
Start: 2019-11-12 — End: 2022-01-22

## 2019-11-12 MED ORDER — dextroamphetamine-amphetamine (ADDERALL XR) 20 MG 24 hr capsule
20 | ORAL_CAPSULE | Freq: Every morning | ORAL | 0 refills | Status: AC
Start: 2019-11-12 — End: 2020-02-11

## 2019-11-12 MED ORDER — escitalopram oxalate (LEXAPRO) 20 MG tablet
20 | ORAL_TABLET | Freq: Every day | ORAL | 3 refills | Status: AC
Start: 2019-11-12 — End: 2020-01-25

## 2019-11-12 MED ORDER — dextroamphetamine-amphetamine (ADDERALL XR) 20 MG 24 hr capsule
20 | ORAL_CAPSULE | Freq: Every morning | ORAL | 0 refills | Status: AC
Start: 2019-11-12 — End: 2020-03-14

## 2019-11-12 MED ORDER — fluticasone propion-salmeteroL (ADVAIR DISKUS) 500-50 mcg/dose DsDv
500-50 | Freq: Two times a day (BID) | RESPIRATORY_TRACT | 3 refills | Status: AC
Start: 2019-11-12 — End: 2020-01-25

## 2019-11-12 NOTE — Unmapped (Signed)
1. Asthma  - stable;  Continue home medications    2. ADD:  - stable; continue with the current treatment ; Prescription(s) were electronically prescribed today

## 2019-11-12 NOTE — Unmapped (Signed)
Subjective   HPI:   Patient ID: Mary [name] is a 49 y.o. female.    The following HPI was reviewed and copied forward (with edits) from a note written by me on 08/16/19. I have reviewed and updated the history, physical exam, data, assessment, and plan of the note as detailed below so that it reflects my evaluation and management of the patient.       The patient's chart was reviewed prior to the visit as part of pre-visit planning  No chief complaint on file.     The following HPI was reviewed and copied forward (with edits) from a note written by me on 05/16/19. I have reviewed and updated the history, physical exam, data, assessment, and plan of the note as detailed below so that it reflects my evaluation and management of the patient.     ADD   C/w AXR 40mg  once daily, concentration is doing ok, did better w 45mg  but $$$ issues   Denies any s/e   Denies diversion or st/drug abuse    Asthma: doing well; needs rx sent to mail order      Controlled Substance Monitoring 06/08/2018 09/11/2018 12/11/2018 03/05/2019 05/16/2019 08/16/2019 11/12/2019   OARRS/eKASPER Status Reviewed Reviewed Reviewed Reviewed Reviewed Reviewed Reviewed   OARRS/eKASPER Consistent with Prescriber Expectation Mary [name]     I have completed the required OARRS/eKASPER documentation for this patient on 11/12/2019.  Mary [name]      Past Medical History:   Diagnosis Date   ??? ADD (attention deficit disorder)    ??? Anxiety    ??? Asthma    ??? Cervical dysplasia    ??? Chronic back pain    ??? Depression    ??? Diverticulitis    ??? Environmental allergies    ??? Iron deficiency    ??? Varicella    ??? Vitamin D deficiency        Current Outpatient Medications   Medication Sig Dispense Refill   ??? albuterol (PROAIR HFA) 90 mcg/actuation inhaler Inhale 2 puffs into the lungs every 6 hours as needed for Wheezing. 3 Inhaler 3   ??? albuterol (PROAIR HFA) 90 mcg/actuation Inhl inhaler Inhale 2 puffs into the lungs every 6 hours as needed for Wheezing. 3 Inhaler 3   ???  cetirizine (ZYRTEC) 10 MG tablet Take 10 mg by mouth daily as needed for Allergies.     ??? cholecalciferol, vitamin D3, (VITAMIN D3) 5,000 unit Tab Take by mouth.     ??? [START ON 01/11/2020] dextroamphetamine-amphetamine (ADDERALL XR) 20 MG 24 hr capsule Take 2 capsules (40 mg total) by mouth every morning for 30 days. 60 capsule 0   ??? [START ON 12/12/2019] dextroamphetamine-amphetamine (ADDERALL XR) 20 MG 24 hr capsule Take 2 capsules (40 mg total) by mouth every morning for 30 days. 60 capsule 0   ??? dextroamphetamine-amphetamine (ADDERALL XR) 20 MG 24 hr capsule Take 2 capsules (40 mg total) by mouth every morning for 30 days. 60 capsule 0   ??? escitalopram oxalate (LEXAPRO) 20 MG tablet Take 1 tablet (20 mg total) by mouth daily. 90 tablet 3   ??? FERROUS SULFATE (IRON ORAL) Take by mouth.     ??? fluticasone (FLONASE) 50 mcg/actuation nasal spray SHAKE LIQUID AND USE 1 TO 2 SPRAYS IN EACH NOSTRIL DAILY AS NEEDED 16 g 0   ??? fluticasone propion-salmeteroL (ADVAIR DISKUS) 500-50 mcg/dose DsDv Inhale 1 puff into the lungs 2 times a day. 3 each  3   ??? melatonin 10 mg Cap Take by mouth.     ??? montelukast (SINGULAIR) 10 mg tablet Take 1 tablet (10 mg total) by mouth at bedtime. 90 tablet 3   ??? predniSONE (DELTASONE) 20 MG tablet 1 by mouth twice daily x 3 days then 1 by mouth once daily x 4 days 10 tablet 0     No current facility-administered medications for this visit.           ROS:   Review of Systems  as above   Objective:   Physical Exam      Wt Readings from Last 3 Encounters:   12/11/18 162 lb (73.5 kg)   09/11/18 159 lb (72.1 kg)   06/08/18 157 lb 4.8 oz (71.4 kg)       There were no vitals filed for this visit.  There is no height or weight on file to calculate BMI.  There is no height or weight on file to calculate BSA.    NAD  Psych: A&Ox3; reactive affect; no obv thought d/o       Lab Results   Component Value Date    WBC 7.9 09/30/2017    HGB 12.3 09/30/2017    HCT 37.3 09/30/2017    MCV 88.7 09/30/2017    PLT 229  09/30/2017       No results found for: HGBA1C  No components found for: GLUF,  MICROALBUR,  LDLCALC,  CREATININE    Lab Results   Component Value Date    GLUCOSE 107 (H) 09/30/2017       No results found for: [facility]       Health Maintenance Summary                Hepatitis C Screening (MyChart) Overdue 07-03-69     Diabetes Screening Overdue 1969/05/03     Depression Monitoring (PHQ-9) Overdue 1968-12-07     Comprehensive Physical Exam Overdue 09/07/69     Alcohol Misuse Screening Overdue 06/19/1987     HIV Screening Overdue 06/19/1987     Immunization: DTaP/Tdap/Td Overdue 06/18/1988     Lipid Panel Overdue 06/18/2014     Immunization: Influenza Overdue 06/05/2019     Mammogram (MyChart) Overdue 06/19/2019     Colon Cancer Screening / Stool Testing Overdue 06/19/2019     Colon Cancer Screening / Colonoscopy (MyChart) Overdue 06/19/2019     Immunization: Zoster Overdue 06/19/2019     Cervical Cancer Screening/Pap Smear (MyChart) Next Due 05/13/2020          Immunization History   Administered Date(s) Administered   ??? Influenza, quadrivalent, intradermal, preservative-free 07/04/2017   ??? Influenza, quadrivalent, preservative-free 08/06/2015, 06/25/2016, 09/11/2018   ??? Influenza, unspecified 05/31/2008   ??? Pneumococcal polysaccharide, 23-valent 10/04/2008         Assessment/Plan:     TI: 154  TO:205    This was a Video visit, including two-way audio and video communication, in lieu of an in-person visit. The patient provided verbal consent to participate in the telehealth visit.   I spent 11 minutes speaking with the patient, conducting an interview, performing a limited exam, and educating the patient on my assessment and plan. I also spent 5 minutes, on the same day as the encounter, preparing to see the patient (eg, review of tests), ordering medications, tests, or procedures, documenting clinical information in the electronic or other health record and performing non-face-to-face activities.       Number and Complexity of  Problems (  Level 4)  During this encounter, I addressed 2 or more stable chronic illnesses.    Amount and/or Complexity of Data Ordered, Reviewed, or Analyzed (Level 2)  I reviewed 1 external note(s) from: OARRS.    Risk of Complication and/or Morbidity or Mortality of Patient Management (Level  4)  The patient's management entails Moderate risk of complications and/or morbidity or mortality.    Overall LOS:  4          A/P:  Patient Instructions   1. Asthma  - stable;  Continue home medications    2. ADD:  - stable; continue with the current treatment ; Prescription(s) were electronically prescribed today         RTC: 3 months    New meds:   none  Patient education given  none  Patient verbalized understanding & agreement of the proposed plan.

## 2019-12-04 LAB — COLOGUARD TEST (STOOL): Cologuard: NEGATIVE

## 2019-12-04 LAB — UNMAPPED LAB RESULTS: Cologuard: NEGATIVE

## 2020-01-25 MED ORDER — escitalopram oxalate (LEXAPRO) 20 MG tablet
20 | ORAL_TABLET | Freq: Every day | ORAL | 3 refills | Status: AC
Start: 2020-01-25 — End: 2021-01-26

## 2020-01-25 MED ORDER — montelukast (SINGULAIR) 10 mg tablet
10 | ORAL_TABLET | Freq: Every evening | ORAL | 3 refills | Status: AC
Start: 2020-01-25 — End: 2021-01-26

## 2020-01-25 MED ORDER — fluticasone propion-salmeteroL (ADVAIR DISKUS) 500-50 mcg/dose DsDv
500-50 | Freq: Two times a day (BID) | RESPIRATORY_TRACT | 3 refills | Status: AC
Start: 2020-01-25 — End: 2021-04-22

## 2020-01-25 NOTE — Telephone Encounter (Signed)
All refills after this go to mail order pharmacy

## 2020-02-11 ENCOUNTER — Ambulatory Visit: Admit: 2020-02-11 | Payer: PRIVATE HEALTH INSURANCE

## 2020-02-11 DIAGNOSIS — F988 Other specified behavioral and emotional disorders with onset usually occurring in childhood and adolescence: Secondary | ICD-10-CM

## 2020-02-11 MED ORDER — dextroamphetamine-amphetamine (ADDERALL XR) 20 MG 24 hr capsule
20 | ORAL_CAPSULE | Freq: Every morning | ORAL | 0 refills | Status: AC
Start: 2020-02-11 — End: 2020-03-14

## 2020-02-11 NOTE — Unmapped (Addendum)
520: ~ 35 mins late to connect w patient for video visit  Called patient: N/a  lvm to call to reschedule with my apologies  message sent to patient via mychart   Controlled Substance Monitoring 09/11/2018 12/11/2018 03/05/2019 05/16/2019 08/16/2019 11/12/2019 02/11/2020   OARRS/eKASPER Status Reviewed Reviewed Reviewed Reviewed Reviewed Reviewed Reviewed   OARRS/eKASPER Consistent with Prescriber Expectation [name]     I have completed the required OARRS/eKASPER documentation for this patient on 02/11/2020.  [name] [name]  Will erx 1 month adderall refill

## 2020-03-14 ENCOUNTER — Ambulatory Visit: Admit: 2020-03-14 | Discharge: 2020-03-14 | Payer: PRIVATE HEALTH INSURANCE

## 2020-03-14 DIAGNOSIS — F988 Other specified behavioral and emotional disorders with onset usually occurring in childhood and adolescence: Secondary | ICD-10-CM

## 2020-03-14 MED ORDER — dextroamphetamine-amphetamine (ADDERALL XR) 20 MG 24 hr capsule
20 | ORAL_CAPSULE | Freq: Every morning | ORAL | 0 refills | Status: AC
Start: 2020-03-14 — End: 2020-06-25

## 2020-03-14 MED ORDER — dextroamphetamine-amphetamine (ADDERALL XR) 20 MG 24 hr capsule
20 | ORAL_CAPSULE | Freq: Every morning | ORAL | 0 refills | Status: AC
Start: 2020-03-14 — End: 2020-03-14

## 2020-03-14 MED ORDER — dextroamphetamine-amphetamine (ADDERALL XR) 20 MG 24 hr capsule
20 | ORAL_CAPSULE | Freq: Every morning | ORAL | 0 refills | Status: AC
Start: 2020-03-14 — End: 2020-06-04

## 2020-03-14 MED ORDER — dextroamphetamineamphetamineADDERALLXR20MG24hrcapsule
20 | ORAL_CAPSULE | Freq: Every morning | ORAL | 0 refills | Status: AC
Start: 2020-03-14 — End: 2020-06-25

## 2020-03-14 NOTE — Unmapped (Signed)
Video visit    65 female on ADD and needs refill    Using Adderall 40 mg daily - two 20 mg daily.   No issues with medication.   Use for 20 years.   No insomnia. Appetite ok. No palpitations or chest pain. Good symptom control on current dose.     Past Medical History:   Diagnosis Date   ??? ADD (attention deficit disorder)    ??? Anxiety    ??? Asthma    ??? Cervical dysplasia    ??? Chronic back pain    ??? Depression    ??? Diverticulitis    ??? Environmental allergies    ??? Iron deficiency    ??? Varicella    ??? Vitamin D deficiency      Exam - well, alert and oriented.   2 dogs  With patient!  No distress. Coherent and conversant.     A/P:  1) ADD - stable on medication. refill of Adderall provided.     This was a Video visit, including two-way audio and video communication, in lieu of an in-person visit. The patient provided verbal consent to participate in the telehealth visit.   I spent 8 minutes speaking with the patient, conducting an interview, performing a limited exam, and educating the patient on my assessment and plan. I also spent 3 minutes, on the same day as the encounter, preparing to see the patient (eg, review of tests), ordering medications, tests, or procedures and documenting clinical information in the electronic or other health record.

## 2020-05-14 NOTE — Telephone Encounter (Signed)
Patient called in needing a PA for her adderall     Insurance doesn't want to cover 2 daily     Please advise at [phone]

## 2020-05-15 NOTE — Unmapped (Signed)
PA has been submitted.     PA has been approved. Request Reference Number: [id number]. ADDERALL XR CAP 20MG  is approved through 05/15/2021. Your patient may now fill this prescription and it will be covered.    Pt informed.

## 2020-06-04 MED ORDER — dextroamphetamine-amphetamine (ADDERALL XR) 20 MG 24 hr capsule
20 | ORAL_CAPSULE | Freq: Every morning | ORAL | 0 refills | Status: AC
Start: 2020-06-04 — End: 2020-06-25

## 2020-06-04 NOTE — Telephone Encounter (Addendum)
Pt is coming on 9/22 for a med refill but she will run out on 9/11 and she is asking if Dr. Thedore Mins will be able to send a refill so she will have enough until the 22nd.    Please advise pt at [phone]

## 2020-06-04 NOTE — Unmapped (Signed)
Addended by: Toni Amend on: 06/04/2020 03:32 PM     Modules accepted: Orders

## 2020-06-25 ENCOUNTER — Ambulatory Visit: Admit: 2020-06-25 | Discharge: 2020-06-25 | Payer: PRIVATE HEALTH INSURANCE

## 2020-06-25 DIAGNOSIS — F988 Other specified behavioral and emotional disorders with onset usually occurring in childhood and adolescence: Secondary | ICD-10-CM

## 2020-06-25 MED ORDER — dextroamphetamine-amphetamine (ADDERALL XR) 20 MG 24 hr capsule
20 | ORAL_CAPSULE | Freq: Every morning | ORAL | 0 refills | Status: AC
Start: 2020-06-25 — End: 2020-09-30

## 2020-06-25 MED ORDER — dextroamphetamine-amphetamine (ADDERALL XR) 20 MG 24 hr capsule
20 | ORAL_CAPSULE | Freq: Every morning | ORAL | 0 refills | Status: AC
Start: 2020-06-25 — End: 2020-08-19

## 2020-06-25 NOTE — Unmapped (Signed)
1. ADD:  - stable; continue with the current treatment ; Prescription(s) were electronically prescribed today    2. See below re overdue health maintenance    Health Maintenance Summary                Hepatitis C Screening (MyChart) Overdue 05-03-1969     Diabetes Screening Overdue 12/30/68     Depression Monitoring (PHQ-9) Overdue Apr 19, 1969     Comprehensive Physical Exam Overdue 1969/07/18     Immunization: COVID-19 Overdue 06/18/1981     Alcohol Misuse Screening Overdue 06/19/1987     HIV Screening Overdue 06/19/1987     Immunization: DTaP/Tdap/Td Overdue 06/18/1988     Lipid Panel Overdue 06/18/2014     Mammogram (MyChart) Overdue 06/19/2019     Immunization: Zoster Overdue 06/19/2019     Cervical Cancer Screening/Pap Smear (MyChart) Overdue 05/13/2020     Immunization: Influenza Next Due 06/04/2020     Colon Cancer Screening / Stool Testing Next Due 12/04/2022     Immunization: Pneumococcal Next Due 06/18/2034

## 2020-06-25 NOTE — Unmapped (Signed)
Subjective   HPI:   Patient ID: Mary [name] is a 49 y.o. female.    The following HPI was reviewed and copied forward (with edits) from a note written by me on 11/12/19. I have reviewed and updated the history, physical exam, data, assessment, and plan of the note as detailed below so that it reflects my evaluation and management of the patient.       The patient's chart was reviewed prior to the visit as part of pre-visit planning  No chief complaint on file.     The following HPI was reviewed and copied forward (with edits) from a note written by me on 05/16/19. I have reviewed and updated the history, physical exam, data, assessment, and plan of the note as detailed below so that it reflects my evaluation and management of the patient.     ADD   C/w AXR 40mg  once daily, concentration is doing ok, did better w 45mg  but $$$ issues   Denies any s/e   Denies diversion or st/drug abuse      Controlled Substance Monitoring 12/11/2018 03/05/2019 05/16/2019 08/16/2019 11/12/2019 02/11/2020 06/25/2020   OARRS/eKASPER Status Reviewed Reviewed Reviewed Reviewed Reviewed Reviewed Reviewed   OARRS/eKASPER Consistent with Prescriber Expectation Mary [name]     I have completed the required OARRS/eKASPER documentation for this patient on 06/25/2020.  Mary [name]      Past Medical History:   Diagnosis Date   ??? ADD (attention deficit disorder)    ??? Anxiety    ??? Asthma    ??? Cervical dysplasia    ??? Chronic back pain    ??? Depression    ??? Diverticulitis    ??? Environmental allergies    ??? Iron deficiency    ??? Varicella    ??? Vitamin D deficiency        Current Outpatient Medications   Medication Sig Dispense Refill   ??? albuterol (PROAIR HFA) 90 mcg/actuation inhaler Inhale 2 puffs into the lungs every 6 hours as needed for Wheezing. 3 Inhaler 3   ??? albuterol (PROAIR HFA) 90 mcg/actuation Inhl inhaler Inhale 2 puffs into the lungs every 6 hours as needed for Wheezing. 3 Inhaler 3   ??? cetirizine (ZYRTEC) 10 MG tablet Take 10 mg by mouth daily  as needed for Allergies.     ??? cholecalciferol, vitamin D3, (VITAMIN D3) 5,000 unit Tab Take by mouth.     ??? [START ON 08/24/2020] dextroamphetamine-amphetamine (ADDERALL XR) 20 MG 24 hr capsule Take 2 capsules (40 mg total) by mouth every morning for 30 days. 60 capsule 0   ??? [START ON 07/25/2020] dextroamphetamine-amphetamine (ADDERALL XR) 20 MG 24 hr capsule Take 2 capsules (40 mg total) by mouth every morning for 30 days. 60 capsule 0   ??? dextroamphetamine-amphetamine (ADDERALL XR) 20 MG 24 hr capsule Take 2 capsules (40 mg total) by mouth every morning for 30 days. 60 capsule 0   ??? escitalopram oxalate (LEXAPRO) 20 MG tablet Take 1 tablet (20 mg total) by mouth daily. 90 tablet 3   ??? FERROUS SULFATE (IRON ORAL) Take by mouth.     ??? fluticasone (FLONASE) 50 mcg/actuation nasal spray SHAKE LIQUID AND USE 1 TO 2 SPRAYS IN EACH NOSTRIL DAILY AS NEEDED 16 g 0   ??? fluticasone propion-salmeteroL (ADVAIR DISKUS) 500-50 mcg/dose DsDv Inhale 1 puff into the lungs 2 times a day. 3 each 3   ??? melatonin 10 mg Cap Take by mouth.     ???  montelukast (SINGULAIR) 10 mg tablet Take 1 tablet (10 mg total) by mouth at bedtime. 90 tablet 3   ??? predniSONE (DELTASONE) 20 MG tablet 1 by mouth twice daily x 3 days then 1 by mouth once daily x 4 days 10 tablet 0     No current facility-administered medications for this visit.          ROS:   Review of Systems  as above   Objective:   Physical Exam      Wt Readings from Last 3 Encounters:   12/11/18 162 lb (73.5 kg)   09/11/18 159 lb (72.1 kg)   06/08/18 157 lb 4.8 oz (71.4 kg)       There were no vitals filed for this visit.  There is no height or weight on file to calculate BMI.  There is no height or weight on file to calculate BSA.    NAD  Psych: A&Ox3; reactive affect; no obv thought d/o       Lab Results   Component Value Date    WBC 7.9 09/30/2017    HGB 12.3 09/30/2017    HCT 37.3 09/30/2017    MCV 88.7 09/30/2017    PLT 229 09/30/2017       No results found for: HGBA1C  No  components found for: GLUF,  MICROALBUR,  LDLCALC,  CREATININE    Lab Results   Component Value Date    GLUCOSE 107 (H) 09/30/2017       No results found for: [facility]       Health Maintenance Summary                Hepatitis C Screening (MyChart) Overdue [birth year] 07, 1969     Diabetes Screening Overdue 02-27-1969     Depression Monitoring (PHQ-9) Overdue 07/31/1969     Comprehensive Physical Exam Overdue Sep 09, 1969     Immunization: COVID-19 Overdue 06/18/1981     Alcohol Misuse Screening Overdue 06/19/1987     HIV Screening Overdue 06/19/1987     Immunization: DTaP/Tdap/Td Overdue 06/18/1988     Lipid Panel Overdue 06/18/2014     Mammogram (MyChart) Overdue 06/19/2019     Immunization: Zoster Overdue 06/19/2019     Cervical Cancer Screening/Pap Smear (MyChart) Overdue 05/13/2020     Immunization: Influenza Next Due 06/04/2020     Colon Cancer Screening / Stool Testing Next Due 12/04/2022     Immunization: Pneumococcal Next Due 06/18/2034          Immunization History   Administered Date(s) Administered   ??? Influenza, quadrivalent, intradermal, preservative-free 07/04/2017   ??? Influenza, quadrivalent, preservative-free 08/06/2015, 06/25/2016, 09/11/2018   ??? Influenza, unspecified 05/31/2008   ??? Pneumococcal polysaccharide, 23-valent 10/04/2008         Assessment/Plan:     TI: 216  TO: 226    This was a Phone conversation, in lieu of an in-person visit. The patient provided verbal consent to participate in the telehealth visit.   I spent 9 minutes speaking with the patient, conducting an interview, performing a limited exam, and educating the patient on my assessment and plan. I also spent 3 minutes, on the same day as the encounter, preparing to see the patient (eg, review of tests), ordering medications, tests, or procedures, documenting clinical information in the electronic or other health record and performing non-face-to-face activities.          A/P:  Patient Instructions     1. ADD:  - stable; continue with the current treatment ;  Prescription(s) were electronically prescribed today    2. See below re overdue health maintenance    Health Maintenance Summary                Hepatitis C Screening (MyChart) Overdue Nov 04, 1968     Diabetes Screening Overdue October 02, 1969     Depression Monitoring (PHQ-9) Overdue 12-05-68     Comprehensive Physical Exam Overdue 03-16-69     Immunization: COVID-19 Overdue 06/18/1981     Alcohol Misuse Screening Overdue 06/19/1987     HIV Screening Overdue 06/19/1987     Immunization: DTaP/Tdap/Td Overdue 06/18/1988     Lipid Panel Overdue 06/18/2014     Mammogram (MyChart) Overdue 06/19/2019     Immunization: Zoster Overdue 06/19/2019     Cervical Cancer Screening/Pap Smear (MyChart) Overdue 05/13/2020     Immunization: Influenza Next Due 06/04/2020     Colon Cancer Screening / Stool Testing Next Due 12/04/2022     Immunization: Pneumococcal Next Due 06/18/2034                 RTC: 3 months    New meds:   none  Patient education given  none  Patient verbalized understanding & agreement of the proposed plan.

## 2020-07-31 ENCOUNTER — Ambulatory Visit: Admit: 2020-07-31 | Discharge: 2020-07-31 | Payer: PRIVATE HEALTH INSURANCE

## 2020-07-31 DIAGNOSIS — J343 Hypertrophy of nasal turbinates: Secondary | ICD-10-CM

## 2020-07-31 MED ORDER — budesonide (PULMICORT) 0.5 mg/2 mL nebulizer solution
0.5 | INJECTION | RESPIRATORY_TRACT | 6 refills | Status: AC
Start: 2020-07-31 — End: 2022-01-22

## 2020-07-31 NOTE — Unmapped (Signed)
Addended by: Zollie Scale on: 07/31/2020 01:50 PM     Modules accepted: Orders

## 2020-07-31 NOTE — Unmapped (Signed)
[facility]  Department of Otolaryngology--Head and Neck Surgery  Division of Rhinology, Allergy, and Anterior Skull Base Surgery  Blair Heys, MD, PhD, [facility]  [address] [place], [place] [address]  Telephone: [phone]        Pt name: Mary [name]  MRN: [id number]      History Present Illness on 07/31/2020:  This is a 49 y.o. female who presents with chronic sinonasal symptomatology including nasal obstruction and congestion along with mucus production and decrease sense of smell.  She has a history of asthma.  She was allergy tested as a child and was positive to pretty much everything.  She is currently using fluticasone nasal spray along with Singulair.  She continues to be symptomatic despite consistent daily usage of these medications for months.      Radiology Findings:   No results found for the past 12 months          Physical Exam:  General Appearance: well appearing, in no apparent distress  Anterior Rhinoscopy: Septum is deviated;  left.  Inferior turbinates are hypertrophied.  Other: None.     Procedure: Because of the patient???s history and symptomatology, I performed bilateral nasal endoscopy after obtaining verbal consent from the patient.  On inspection of the interior of the nasal cavity, I examined the inferior turbinates, middle turbinates, middle meatuses, superior meatuses and sphenoethmoid recesses.  Visualization of the superior meatuses and sphenoethmoid recesses was obscured by the middle turbinate and the septum.  All structures appeared normal/unremarkable except as noted below:    Polypoid changes to both middle    Edema in both middle meatuses    There is a small polyp in the right sphenoethmoid recess        Impression:  Chronic rhinosinusitis with nasal polyps  Environmental allergies  Asthma  Septal deviation  Inferior turbinate hypertrophy    Plan:  Chronic sinonasal symptomatology--such as nasal congestion, drainage, facial  pain/pressure and/or hyposmia--may indicate a diagnosis of chronic rhinosinusitis (CRS), an inflammatory condition of the paranasal sinuses.  However, consensus guideline criteria of the formal diagnosis of CRS require objective evidence of this condition on nasal endoscopy or by radiographic imaging.  At present, although the history is potentially consistent, we do not have objective evidence of CRS based on nasal endoscopy today.  Alternatively rhinitis, whether allergic or non-allergic, due to inflammation in the nasal cavity may lead to similar symptomatology.   Often, symptomatology can be controlled by medically targeting sinonasal inflammation.  Further evaluation may be implemented to identify whether there exists objective evidence of CRS.  All of these considerations and related complexities were discussed in detail with the patient.    Twice daily budesonide irrigations  Follow-up in 2 months with sinus CT scan and allergy testing on the same day          Number and Complexity of Problems (Level 4)  During this encounter, I addressed 1 or more chronic illnesses with exacerbation, progression, or side effects of treatmentRisk of Complication and/or Morbidity or Mortality of Patient Management (Level  4)  The patient's management entails Moderate risk of complications and/or morbidity or mortality.          Attestation: in the case of resident participation in the care of this patient, I attest that I have seen and examined the patient, formulated the plan and agree with the documentation.      Blair Heys, MD, PhD, FACS  Director,  Division of Rhinology, Allergy, and Anterior Skull Base Surgery  Department of Otolaryngology--Head and Neck Surgery  [facility]  [facility]    CC:   Manoj [name], MD  Melissa Montane, MD    **This note was dictated with voice-recognition software**

## 2020-07-31 NOTE — Unmapped (Signed)
Addended byThora Lance on: 07/31/2020 04:01 PM     Modules accepted: Orders

## 2020-08-13 NOTE — Unmapped (Signed)
LVM to schedule allergy test, CT, and follow-up in 2 months w/ Dr. Neldon Mc. Left number for scheduling

## 2020-08-19 MED ORDER — dextroamphetamine-amphetamine (ADDERALL XR) 20 MG 24 hr capsule
20 | ORAL_CAPSULE | Freq: Every morning | ORAL | 0 refills | Status: AC
Start: 2020-08-19 — End: 2020-09-30

## 2020-08-20 ENCOUNTER — Inpatient Hospital Stay: Admit: 2020-08-20 | Payer: PRIVATE HEALTH INSURANCE

## 2020-08-20 DIAGNOSIS — Z1231 Encounter for screening mammogram for malignant neoplasm of breast: Secondary | ICD-10-CM

## 2020-09-22 ENCOUNTER — Ambulatory Visit: Payer: PRIVATE HEALTH INSURANCE

## 2020-09-22 ENCOUNTER — Inpatient Hospital Stay: Admit: 2020-09-22 | Discharge: 2020-09-26 | Payer: PRIVATE HEALTH INSURANCE

## 2020-09-22 DIAGNOSIS — J328 Other chronic sinusitis: Secondary | ICD-10-CM

## 2020-09-30 ENCOUNTER — Ambulatory Visit: Admit: 2020-09-30 | Payer: PRIVATE HEALTH INSURANCE

## 2020-09-30 DIAGNOSIS — F988 Other specified behavioral and emotional disorders with onset usually occurring in childhood and adolescence: Secondary | ICD-10-CM

## 2020-09-30 MED ORDER — dextroamphetamine-amphetamine (ADDERALL XR) 20 MG 24 hr capsule
20 | ORAL_CAPSULE | Freq: Every morning | ORAL | 0 refills | Status: AC
Start: 2020-09-30 — End: 2021-01-20

## 2020-09-30 NOTE — Unmapped (Addendum)
Encounter Diagnoses   Name Primary?   ??? Well adult exam  - 12 hours FASTING for blood tests: take the order sheet to the [facility] lab in our building (if you prefer you can also take to another lab, like [facility] or [facility])    Our lab is usually open Monday-Friday from 7 a.m. to 6 p.m and Saturdays from 7:30 am- 1 pm            ??? Abnormal CT scan  -  Home sleep study: a referral will be faxed , they will contact you within a week. If you havent heard from them in the next 1-2 weeks do contact us. If the study shows that you have sleep apnea we'll arrange a 2nd home sleep study to see if CPAP may be needed  - labs  - better diet/exercise    ??? Attention deficit disorder, unspecified hyperactivity presence  - continue with the current treatment   Yes

## 2020-09-30 NOTE — Unmapped (Signed)
Subjective   HPI:   Patient ID: Mary [name] is a 49 y.o. female.    The following HPI was reviewed and copied forward (with edits) from a note written by me on 06/25/20. I have reviewed and updated the history, physical exam, data, assessment, and plan of the note as detailed below so that it reflects my evaluation and management of the patient.       The patient's chart was reviewed prior to the visit as part of pre-visit planning  Chief Complaint   Patient presents with   ??? Medication Refill     pt. wants the flu vaccine.      The following HPI was reviewed and copied forward (with edits) from a note written by me on 05/16/19. I have reviewed and updated the history, physical exam, data, assessment, and plan of the note as detailed below so that it reflects my evaluation and management of the patient.     ADD   C/w AXR 40mg  once daily, concentration is doing ok, did better w 45mg  but $$$ issues   Denies any s/e   Denies diversion or st/drug abuse    Also concerned re recent head CT results: incidental findings seen on sinus CT  - I feel like I tend to forget a lot I dont feel as intelligent as I was ; occl name forgetting and word finding issues  also I hear some swooshing R neck  Diet/exercise: not v good  Sleep: variable, sometimes not v good and occly has trouble falling asleep again; I snore badly too      Controlled Substance Monitoring 03/05/2019 05/16/2019 08/16/2019 11/12/2019 02/11/2020 06/25/2020 09/30/2020   OARRS/eKASPER Status Reviewed Reviewed Reviewed Reviewed Reviewed Reviewed Reviewed   OARRS/eKASPER Consistent with Prescriber Expectation Mary [name]     I have completed the required OARRS/eKASPER documentation for this patient on 09/30/2020.  Mary [name] Mary [name]      Past Medical History:   Diagnosis Date   ??? ADD (attention deficit disorder)    ??? Anxiety    ??? Asthma    ??? Cervical dysplasia    ??? Chronic back pain    ??? Depression    ??? Diverticulitis    ??? Environmental allergies    ??? Iron  deficiency    ??? Varicella    ??? Vitamin D deficiency        Current Outpatient Medications   Medication Sig Dispense Refill   ??? albuterol (PROAIR HFA) 90 mcg/actuation inhaler Inhale 2 puffs into the lungs every 6 hours as needed for Wheezing. 3 Inhaler 3   ??? albuterol (PROAIR HFA) 90 mcg/actuation Inhl inhaler Inhale 2 puffs into the lungs every 6 hours as needed for Wheezing. 3 Inhaler 3   ??? budesonide (PULMICORT) 0.5 mg/2 mL nebulizer solution Add one 2mL ampule (0.5mg ) into 250 mL saline in nasal rinse bottle and irrigate both sides of your nose with entire volume twice a day. 60 ampule 6   ??? cetirizine (ZYRTEC) 10 MG tablet Take 10 mg by mouth daily as needed for Allergies.     ??? cholecalciferol, vitamin D3, (VITAMIN D3) 5,000 unit Tab Take by mouth.     ??? [START ON 11/29/2020] dextroamphetamine-amphetamine (ADDERALL XR) 20 MG 24 hr capsule Take 2 capsules (40 mg total) by mouth every morning for 30 days. 60 capsule 0   ??? escitalopram oxalate (LEXAPRO) 20 MG tablet Take 1 tablet (20 mg total) by mouth daily. 90 tablet  3   ??? FERROUS SULFATE (IRON ORAL) Take by mouth.     ??? fluticasone (FLONASE) 50 mcg/actuation nasal spray SHAKE LIQUID AND USE 1 TO 2 SPRAYS IN EACH NOSTRIL DAILY AS NEEDED 16 g 0   ??? fluticasone propion-salmeteroL (ADVAIR DISKUS) 500-50 mcg/dose DsDv Inhale 1 puff into the lungs 2 times a day. 3 each 3   ??? melatonin 10 mg Cap Take by mouth.     ??? montelukast (SINGULAIR) 10 mg tablet Take 1 tablet (10 mg total) by mouth at bedtime. 90 tablet 3   ??? predniSONE (DELTASONE) 20 MG tablet 1 by mouth twice daily x 3 days then 1 by mouth once daily x 4 days 10 tablet 0   ??? [START ON 10/30/2020] dextroamphetamine-amphetamine (ADDERALL XR) 20 MG 24 hr capsule Take 2 capsules (40 mg total) by mouth every morning for 30 days. 60 capsule 0   ??? dextroamphetamine-amphetamine (ADDERALL XR) 20 MG 24 hr capsule Take 2 capsules (40 mg total) by mouth every morning for 30 days. 60 capsule 0     No current  facility-administered medications for this visit.          ROS:   Review of Systems  as above   Objective:   Physical Exam      Wt Readings from Last 3 Encounters:   09/30/20 169 lb 3.2 oz (76.7 kg)   07/31/20 165 lb (74.8 kg)   12/11/18 162 lb (73.5 kg)       Vitals:    09/30/20 1309   BP: 134/84   BP Location: Left arm   Patient Position: Sitting   BP Cuff Size: Large   Pulse: 85   SpO2: 100%   Weight: 169 lb 3.2 oz (76.7 kg)     Body mass index is 27.31 kg/m??.  Body surface area is 1.89 meters squared.    NAD  Psych: A&Ox3; reactive affect; no obv thought d/o       Lab Results   Component Value Date    WBC 7.9 09/30/2017    HGB 12.3 09/30/2017    HCT 37.3 09/30/2017    MCV 88.7 09/30/2017    PLT 229 09/30/2017       No results found for: HGBA1C  No components found for: GLUF,  MICROALBUR,  LDLCALC,  CREATININE    Lab Results   Component Value Date    GLUCOSE 107 (H) 09/30/2017       No results found for: LIPIDCOMM       Upcoming Health Maintenance     Hepatitis C Screening (MyChart) (Once)  Overdue - never done    Diabetes Screening (Every 3 Years)  Ordered on 09/30/2020    Depression Monitoring (PHQ-9) (Every 3 Months)  Overdue - never done    Comprehensive Physical Exam (Yearly)  Overdue - never done    Immunization: COVID-19 (1)  Overdue - never done    HIV Screening (Once)  Overdue - never done    Immunization: DTaP/Tdap/Td (1 - Tdap)  Overdue - never done    Lipid Panel (Every 5 Years)  Ordered on 09/30/2020    Immunization: Zoster (1 of 2)  Overdue - never done    Cervical Cancer Screening/Pap Smear (MyChart) (Every 3 Years)  Overdue since 05/13/2020    Immunization: Influenza (1)  Overdue since 06/04/2020    Alcohol Misuse Screening (Yearly)  Next due on 09/30/2021    Mammogram (MyChart) (Every 2 Years)  Next due on 08/20/2022  Colon Cancer Screening / Stool Testing (Every 3 Years)  Next due on 12/04/2022    Immunization: Pneumococcal (2 of 2 - PPSV23)  Next due on 06/18/2034         Immunization History    Administered Date(s) Administered   ??? Influenza, quadrivalent, intradermal, preservative-free 07/04/2017   ??? Influenza, quadrivalent, preservative-free 08/06/2015, 06/25/2016, 09/11/2018   ??? Influenza, unspecified 05/31/2008   ??? Pneumococcal polysaccharide, 23-valent 10/04/2008         Assessment/Plan:     TI: 110  TO: 130        A/P:  Patient Instructions     Encounter Diagnoses   Name Primary?   ??? Well adult exam  - 12 hours FASTING for blood tests: take the order sheet to the [facility] lab in our building (if you prefer you can also take to another lab, like [facility] or [facility])    Our lab is usually open Monday-Friday from 7 a.m. to 6 p.m and Saturdays from 7:30 am- 1 pm            ??? Abnormal CT scan  -  Home sleep study: a referral will be faxed , they will contact you within a week. If you havent heard from them in the next 1-2 weeks do contact us. If the study shows that you have sleep apnea we'll arrange a 2nd home sleep study to see if CPAP may be needed  - labs  - better diet/exercise    ??? Attention deficit disorder, unspecified hyperactivity presence  - continue with the current treatment   Yes            RTC: 3 months    New meds:   none  Patient education given  none  Patient verbalized understanding & agreement of the proposed plan.

## 2020-10-01 ENCOUNTER — Ambulatory Visit: Admit: 2020-10-01 | Payer: PRIVATE HEALTH INSURANCE

## 2020-10-01 DIAGNOSIS — Z0000 Encounter for general adult medical examination without abnormal findings: Secondary | ICD-10-CM

## 2020-10-01 LAB — COMPREHENSIVE METABOLIC PANEL, SERUM
ALT: 12 U/L (ref 7–52)
AST (SGOT): 16 U/L (ref 13–39)
Albumin: 4.5 g/dL (ref 3.5–5.7)
Alkaline Phosphatase: 47 U/L (ref 36–125)
Anion Gap: 7 mmol/L (ref 3–16)
BUN: 8 mg/dL (ref 7–25)
CO2: 30 mmol/L (ref 21–33)
Calcium: 9.2 mg/dL (ref 8.6–10.3)
Chloride: 101 mmol/L (ref 98–110)
Creatinine: 0.66 mg/dL (ref 0.60–1.30)
Glucose: 96 mg/dL (ref 70–100)
Osmolality, Calculated: 284 mOsm/kg (ref 278–305)
Potassium: 4.1 mmol/L (ref 3.5–5.3)
Sodium: 138 mmol/L (ref 133–146)
Total Bilirubin: 0.8 mg/dL (ref 0.0–1.5)
Total Protein: 7.2 g/dL (ref 6.4–8.9)
eGFR AA CKD-EPI: >90 See note.
eGFR NONAA CKD-EPI: >90 See note.

## 2020-10-01 LAB — LIPID PANEL
Cholesterol, Total: 248 mg/dL — ABNORMAL HIGH (ref 0–200)
HDL: 87 mg/dL (ref 60–92)
LDL Cholesterol: 139 mg/dL
Triglycerides: 108 mg/dL (ref 10–149)

## 2020-10-01 LAB — HEMOGLOBIN A1C: Hemoglobin A1C: 5.9 % (ref 4.0–5.6)

## 2020-10-01 LAB — VITAMIN B12: Vitamin B-12: >1506 pg/mL (ref 180–914)

## 2020-10-01 LAB — TSH: TSH: 1.79 u[IU]/mL (ref 0.45–4.12)

## 2020-10-02 ENCOUNTER — Ambulatory Visit: Payer: PRIVATE HEALTH INSURANCE

## 2020-10-13 ENCOUNTER — Ambulatory Visit: Payer: PRIVATE HEALTH INSURANCE

## 2020-10-16 ENCOUNTER — Ambulatory Visit: Payer: PRIVATE HEALTH INSURANCE

## 2020-10-16 NOTE — Unmapped (Deleted)
[facility]  Department of Otolaryngology???Head and Neck Surgery  Division of Rhinology, Allergy, and Anterior Skull Base Surgery  Blair Heys, MD, PhD, [facility]  [address] [place], [place] [address]  Telephone: [phone]        Pt name: Mary [name]  MRN: [id number]      History Present Illness on 07/31/2020:  This is a 50 y.o. female who presents with chronic sinonasal symptomatology including nasal obstruction and congestion along with mucus production and decrease sense of smell.  She has a history of asthma.  She was allergy tested as a child and was positive to pretty much everything.  She is currently using fluticasone nasal spray along with Singulair.  She continues to be symptomatic despite consistent daily usage of these medications for months.    Interval history on October 16, 2020:  When I last saw her I started her on twice daily budesonide irrigations for chronic rhinosinusitis with nasal polyps.    Allergy testing performed with [place] at [facility] on October 16, 2020 was ***    Today she reports that ***    Radiology Findings:     Sinus CT scan performed at [facility] on October 16, 2020 shows on my read:    ***        Physical Exam:  General Appearance: well appearing, in no apparent distress  Anterior Rhinoscopy: Septum is deviated;  left.  Inferior turbinates are hypertrophied.  Other: None.     Procedure: Because of the patient???s history and symptomatology, I performed bilateral nasal endoscopy after obtaining verbal consent from the patient.  On inspection of the interior of the nasal cavity, I examined the inferior turbinates, middle turbinates, middle meatuses, superior meatuses and sphenoethmoid recesses.  Visualization of the superior meatuses and sphenoethmoid recesses was obscured by the middle turbinate and the septum.  All structures appeared normal/unremarkable except as noted below:    ***    Impression:  Chronic rhinosinusitis with nasal  polyps  Environmental allergies  Asthma  Septal deviation  Inferior turbinate hypertrophy    Plan:  ***        Number and Complexity of Problems (Level 4)  During this encounter, I addressed 1 or more chronic illnesses with exacerbation, progression, or side effects of treatmentRisk of Complication and/or Morbidity or Mortality of Patient Management (Level  4)  The patient's management entails Moderate risk of complications and/or morbidity or mortality.          Attestation: in the case of resident participation in the care of this patient, I attest that I have seen and examined the patient, formulated the plan and agree with the documentation.      Blair Heys, MD, PhD, FACS  Director, Division of Rhinology, Allergy, and Anterior Skull Base Surgery  Department of Otolaryngology???Head and Neck Surgery  [facility]  [facility]    CC:   Manoj [name], MD    **This note was dictated with voice-recognition software**

## 2020-10-24 ENCOUNTER — Institutional Professional Consult (permissible substitution)
Admit: 2020-10-24 | Discharge: 2020-10-24 | Payer: PRIVATE HEALTH INSURANCE | Attending: Clinical & Laboratory Immunology

## 2020-10-24 DIAGNOSIS — J3089 Other allergic rhinitis: Secondary | ICD-10-CM

## 2020-10-24 NOTE — Unmapped (Signed)
Patient:Mary [name],  Solstice   DOB: [birth year]            MRN:  [id number]            Physician: Neldon Mc     The purpose of this allergy and asthma questionnaire is to help your doctor and allergy nurse obtain a thorough allergy history. It is extremely importnt to have an accurate and complete history, so we can compare your daily exposures to symptoms you are having;  with this information we can customize your treatment based on your history, symptoms and testing results.  Please have this questionnaire filled out and bring it with you for your allergy testing.  1. Do you suffer from any skin disorders i.e., Eczema or angioedema?___NO_____  2. Have you ever been told that you have asthma?__YES__  3. Have you ever had an anaphylactic reaction? _____NO________  4. Have you had your tonsils and adenoids removed, or PE tubes placed in your ears? __NO____  5. Are you taking any over the counter medications that have antihistamines in them? ____NO_____  6. What is your working/living environment like? i.e., tobacco smoke, moldy, poor filtration etc_____________  7. What is your exposure to animals? ____2 DOGS____  8. 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 How old is your home/apartment? __20 YEARS___  10. Does your home have hardwood floors or carpeting, if carpeting how long has it been there? ___BOTH______  11. Do you have mulch in your yard? _YES_  12. Do you use a live or artificial Christmas tree? __ARTIFICIAL____  13. Do you have a basement? __YES___ is it finished or unfinished? ____FINISHED_  14. Have you ever been tested for allergies before? __YES__  15. Are your symptoms all the time or at certain times of the year and if so when? _ALL THE TIME!!!!____  16. Do you wake up in the AM with nasal congestion or headaches? ___YES_______  17. Do you snore at night? _YES_  18. Do you wake up during the night coughing? _SOMETIMES___  19. Does any family members have allergies, asthma or sinus issues?  ___FATHER HAS ASTHMA_______  20. Do you have any known food ALLERGIES__NONE______  21. Do you take any medication called a beta blocker (commonly used for high blood pressure, migraine headaches or glaucoma)? ___NO____  22. What symptoms are you having:  Sinus headaches_YES__ Sinus pressure and facial pain_YES___chronic sinus infections__YES__   Sneezing_YES__Coughing_YES___Post nasal discharge_YES__  Dry itchy eyes   YES___     Watery itchy eyes__YES_ Watery itchy nose__YES__  Ear fullness (ears are plugged, need to pop your ears or feel like you???re under water___YES___  Throat clearing_YES__ Reflux (acid reflex) _YES_ the sensation of something caught in your throat___NO_  Wheezing_YES___  Short of breath__YES__          Dry itchy skin_YES___ Rashes_YES_    QUESTIONS FOR PATIENTS WITH ASTHMA    1. Do you take a rescue inhaler more than twice a week__YES___  2. Are you woken up at night with asthma symptoms more than three times a year_YES__  3. Do you refill your rescue inhaler more than three times a year_YES__  4. Is your asthma worse at certain times of the year__POLLEN SEASON___  5. What are your asthma triggers__POLLEN, WEATHER CHANGES___  6. Do you require any oral or injection steroid medication for your asthma___YES__    Patient had clear lung sounds and FVC was normal.    Patient has not taken any medication that may interfere with this  allergy testing. Patient's allergy history information was reviewed with the patient. Patient denies/has a history of asthma.    Obtained consent form for allergy testing. Consent form placed in scanning bin. Notified patient that a potential anaphylactic reaction can occur with testing. Patient voiced understanding.     Patient tolerated prick and intradermal testing. Benadryl gel applied to testing sites. Patient provided with educational information including avoidances, mold information and concomitant foods for reference. Went over the information with the patient.  They voiced their understanding of the information.    Education regarding immunotherapy was also provided. Nurse stated that allergy immunotherapy takes time to take effect. Noted that individuals immune system are different and take time to build immunity to the antigens. The time can vary from patient to patient. The patient voiced understanding of this concept.    Emphasized the difference between SCIT and SLIT. Noted that with SCIT the patient has to build up slowly in order to not cause an anaphylactic reaction. Nurse stated that the patient  take injections weekly to build up to concentrate. Nurse noted that with SLIT patients are automatically on concentrate and that the drops can be taken home. Nursed noted that the drops are a daily dose.  The downside with this, as explained to the patient, is that the SLIT is not covered by insurance  Where as SCIT is. Advised patient to check with their insurance on what their out of pocket will be for the SCIT.The patient voiced understanding of this.    Nurse provided the UC Concomitant Food List to the patient during the visit. Encouraged the patient to take note the pollens their allergic to, because some pollens tend to cross react with the foods they eat.     Advised the patient to keep the list of concomitant foods on the refrigerator door so that they may have something to use as a reference if they start to have symptoms with a certain food. Nurse also noted the different months that the pollens pollinate in. The patient voiced understanding of these recommendations.        [facility]  [address]  [place], [place]   [address]   Patient: Mary, [name] DOB:12-29-1970   MRN: [id number] Physician:EDAGHAT Date: 10/24/2020    ANTIGEN PRICK #5 #2 END POINT   DUST  MITES 1) D. Farinae 5 5 ---- 4    2) D. Pteronyssinus 5 5 ---- 4    3) Cockroach 0  7 3    4) Cat, standardized 5 7 ---- 5    5) Dog Hair 0  9 3   MOLDS  6) Alternaria5 5 5 ---- 4    7) Aspergillus  5 9 ---- 6    8) Helminthosporium 0  11 3    9) Cladsporium 0  7 3    10) Candida Albicans 0  9 3    11) Penicillium notatum 0  7 3    12) Fusarium  5 7 ---- 5    13) Epiccocum 5 7 ---- 5    14) Pullularia 5 5 ---- 4    15) Rhizopus 0  9 3   TREES  (Feb-May) 16) Cedar, Red 5 7 ---- 5    17) Birch, Red (River) 9  ---- 6    18) Ash, White 7 7 ---- 5    19) Pecan/Hickory 5 7 ---- 5    20) Elm, American 5 9 ---- 6  21) Box elder/Maple 0  11 3    22) Oak, Northern Red 7 5 ---- 5    23) Cottonwood 5 5 ---- 4   WEEDS  (July-Sep) 24) English Plantain 5 5 ---- 4    25) Ragweed Short/Giant 11  ---- 6    26) Lambs quarter 0  9 3    27) Pigweed 0  11 3    28)Sorrel 0  13 3    29)Wormwood 5 7 ---- 5   GRASSES  (Ap-Sep) 30) Johnson grass 5 7 ---- 5    31) Timothy grass 5 7 ---- 5    32) [place] Grass 5 7 ---- 4   + control HISTAMINE   7       - control GLYCERIN   0

## 2020-11-13 ENCOUNTER — Ambulatory Visit: Admit: 2020-11-13 | Discharge: 2020-11-13 | Payer: PRIVATE HEALTH INSURANCE

## 2020-11-13 DIAGNOSIS — J329 Chronic sinusitis, unspecified: Secondary | ICD-10-CM

## 2020-11-13 MED ORDER — EPINEPHrine (AUVI-Q) 0.3 mg/0.3 mL AtIn injection
0.3 | Freq: Once | INTRAMUSCULAR | 0 refills | 30.00000 days | Status: AC
Start: 2020-11-13 — End: 2022-01-28

## 2020-11-13 NOTE — Unmapped (Signed)
[facility]  Department of Otolaryngology--Head and Neck Surgery  Division of Rhinology, Allergy, and Anterior Skull Base Surgery  Blair Heys, MD, PhD, [facility]  [address] [place], [place] [address]  Telephone: [phone]        Pt name: Mary [name]  MRN: [id number]      History Present Illness on 07/31/2020:  This is a 50 y.o. female who presents with chronic sinonasal symptomatology including nasal obstruction and congestion along with mucus production and decrease sense of smell.  She has a history of asthma.  She was allergy tested as a child and was positive to pretty much everything.  She is currently using fluticasone nasal spray along with Singulair.  She continues to be symptomatic despite consistent daily usage of these medications for months.    Interval history on November 13, 2020:  When I last saw her I started her on twice daily budesonide irrigations for chronic rhinosinusitis with nasal polyps.    Allergy testing performed with [place] at [facility] on October 24, 2020 was positive to multiple perennial and seasonal allergens, including on skin prick testing.    Today she reports that she continues to be symptomatic.      Radiology Findings:     Sinus CT scan performed at [facility] on September 22, 2020 shows on my read:    Mucosal thickening in the ethmoids, maxillary sinuses and OMCs.        Physical Exam:  General Appearance: well appearing, in no apparent distress  Anterior Rhinoscopy: Septum is deviated;  left.  Inferior turbinates are hypertrophied.  Other: None.     Procedure: Because of the patient???s history and symptomatology, I performed bilateral nasal endoscopy after obtaining verbal consent from the patient.  On inspection of the interior of the nasal cavity, I examined the inferior turbinates, middle turbinates, middle meatuses, superior meatuses and sphenoethmoid recesses.  Visualization of the superior meatuses and sphenoethmoid recesses was obscured  by the middle turbinate and the septum.  All structures appeared normal/unremarkable except as noted below:    Severe edema of both middle turbinates with some polypoid change     Impression:  Chronic rhinosinusitis with nasal polyps  Environmental allergies  Asthma  Septal deviation  Inferior turbinate hypertrophy    Plan:  At this point she is a good candidate for endoscopic sinus surgery with septoplasty and inferior turbinate reductions. She will think about it and let us know.    She will continue to use twice daily budesonide irrigations and I have encouraged her to begin immunotherapy which she would like to do as well.        Number and Complexity of Problems (Level 4)  During this encounter, I addressed 1 or more chronic illnesses with exacerbation, progression, or side effects of treatment    Risk of Complication and/or Morbidity or Mortality of Patient Management (Level  4)  The patient's management entails Moderate risk of complications and/or morbidity or mortality.          Attestation: in the case of resident participation in the care of this patient, I attest that I have seen and examined the patient, formulated the plan and agree with the documentation.      Blair Heys, MD, PhD, FACS  Director, Division of Rhinology, Allergy, and Anterior Skull Base Surgery  Department of Otolaryngology--Head and Neck Surgery  [facility]  [place] [facility]  CC:   Manoj [name], MD    **This note was dictated with voice-recognition software**

## 2020-11-14 NOTE — Unmapped (Addendum)
[facility]  ALLERGY TREATMENT VIAL WORKSHEET ENVIRONMENTAL  NAME: Mary [name], Mary [name] DOB: [birth year] DR. SEDAGHAT DATE: 11/14/2020  EXTRACT#1 START DATE: 11/2020  DUST MITES: End Point: Milas Gain Dilution: Volume: ml   D. Farinae 4 2 0.2   D. Pteronyssinus 4 2 0.2   ANIMALS:      Cockroach 3 Not treated ---   Cat 5 3 0.2   Dog 3 1 0.2   MOLDS      Alternaria 4 2 0.2   Aspergillus 6 4 0.2   Helminthosporium 3 Not treated ---   Hormodendrum 3 Not treated ---   Candida Albicans 3 Not treated ---   Penicillium notatum 3 Not treated ---   Fusarium Oxysporium 5 3 0.2   Epicoccum 5 3 0.2   Pullularia 4 2 0.2   Rhizopus 3 Not treated ---   Total Volume of Antigens: 1.61ml   Total Volume of Diluent: 2.38ml   Total Volume of Glycerin:   1ml   Total Volume:   5ml   EXTRACT#2 START DATE: 11/2020  TREES      Cedar 5 3 0.2   Birch 6 4 0.2   Ash 5 Not treated ---   Pecan/ Hickory 5 3 0.2   Elm 6 4 0.2   Box Elder 3 Not treated ---   Oak 5 3 0.2   Cottonwood 4 Not treated ---   GRASSES      Johnson Grass 5 Not treated ---   Timothy Grass 5 3 0.2   [place] Territ[place] Grass 4 2 0.2   WEEDS      English Plantain 4 2 0.2   Ragweed 6 4 0.2   Lambs quarter 3 Not treated ---   Pigweed 3 Not treated ---   Sorrel 3 Not treated ---   Wormwood 5 3 0.2   Total Volume of Antigens: 2ml   Total Volume of Diluent: 2ml   Total volume of Glycerin: 1ml   Total Volume: 5ml   Prepared by: G.Iracema Lanagan RN 11/14/2020     Charges generated on paper invoice.    Extract lot numbers [id number]    D.Luanne Bras: [id number] EXP: 05/06/2022  D.Pteronyssinus: [id number] EXP: 06/23/2022    Cat: [id number] EXP: 12/22/2022  Dog Hair: [id number] EXP: 07/15/2023    Alternaria: [id number] EXP: 07/29/2023  Aspergillus: [id number] EXP: 05/27/2023  Fusarium: [id number] EXP: 06/29/2023  Epiccocum: [id number] EXP: 08/04/2022  Pullaria: [id number] EXP: 12/22/2022    Cedar: [id number] EXP: 01/16/2023  Charletta Cousin, River: [id number] EXP: 07/15/2023  Pecan/Hickory: [id number] EXP: 08/19/2023  Elm, American: [id number] EXP: 06/27/2021  Lafe Garin Red: [id number] EXP: 08/19/2023    English Plantain: [id number] EXP: 09/22/2023  Ragweed Short/Giant: [id number] EXP: 10/27/2022  Wormwood: [id number] EXP: 10/10/2022    Juliann Mule: [id number] EXP: 07/07/2022  [place] Grass: (10) [id number] EXP: 02/22/2024    Glycerin: [id number] EXP: 08/2025

## 2020-11-17 NOTE — Unmapped (Signed)
Attempted to leave a voicemail to notify the patient that her SCIT vials are ready. VM box is currently not set up unable to leave a message.

## 2020-11-18 ENCOUNTER — Inpatient Hospital Stay: Admit: 2020-11-18 | Discharge: 2020-11-25 | Payer: PRIVATE HEALTH INSURANCE

## 2020-11-18 DIAGNOSIS — R9389 Abnormal findings on diagnostic imaging of other specified body structures: Secondary | ICD-10-CM

## 2020-11-18 NOTE — Unmapped (Signed)
Attempted to contact the patient to notify her that her SCIT vials are ready. Unable to leave a voicemail since the pt's voicemail box is not set up. Went ahead and sent a message via MyChart and notified the patient that her vials are ready. Message as of this morning has not been read.

## 2020-11-21 NOTE — Unmapped (Signed)
Called twice and lvm to schedule surgery

## 2020-12-18 ENCOUNTER — Ambulatory Visit: Payer: PRIVATE HEALTH INSURANCE

## 2020-12-19 NOTE — Unmapped (Signed)
patient is visiting [place] for a Death in her family.     She is in need of her final refill of her adderral to be sent to a pharmacy in [place] by the doctor.    She tried to get her last refill from [facility] transferred to the pharmacy in [place] but [facility] states they can not transfer the prescription of Adderral to [place].         I updated the pharmacy in the system.    Please advise   [phone]

## 2020-12-19 NOTE — Unmapped (Signed)
LMTCB

## 2021-01-20 ENCOUNTER — Ambulatory Visit: Admit: 2021-01-20 | Payer: PRIVATE HEALTH INSURANCE

## 2021-01-20 DIAGNOSIS — F988 Other specified behavioral and emotional disorders with onset usually occurring in childhood and adolescence: Secondary | ICD-10-CM

## 2021-01-20 MED ORDER — dextroamphetamine-amphetamine (ADDERALL XR) 20 MG 24 hr capsule
20 | ORAL_CAPSULE | Freq: Every morning | ORAL | 0 refills | Status: AC
Start: 2021-01-20 — End: 2021-04-22

## 2021-01-20 NOTE — Unmapped (Signed)
Subjective   HPI:   Patient ID: Mary [name] is a 50 y.o. female.    The following HPI was reviewed and copied forward (with edits) from a note written by me on 09/30/20. I have reviewed and updated the history, physical exam, data, assessment, and plan of the note as detailed below so that it reflects my evaluation and management of the patient.       The patient's chart was reviewed prior to the visit as part of pre-visit planning  Chief Complaint   Patient presents with   ??? Follow-up     On medication.         ADD   C/w AXR 40mg  once daily, concentration is doing ok (past did better w 45mg  but $$$ issues)    Is keen to reduce dose and eventually dc, would like to see psych re this   Denies any s/e   Denies diversion or st/drug abuse    elev BP: no h/o HTN but BP and weight have been trending up        Controlled Substance Monitoring 05/16/2019 08/16/2019 11/12/2019 02/11/2020 06/25/2020 09/30/2020 01/20/2021   OARRS/eKASPER Status Reviewed Reviewed Reviewed Reviewed Reviewed Reviewed Reviewed   OARRS/eKASPER Consistent with Prescriber Expectation [name]     I have completed the required OARRS/eKASPER documentation for this patient on 01/20/2021.  Raven Harmes [name]      Past Medical History:   Diagnosis Date   ??? ADD (attention deficit disorder)    ??? Anxiety    ??? Asthma    ??? Cervical dysplasia    ??? Chronic back pain    ??? Depression    ??? Diverticulitis    ??? Environmental allergies    ??? Iron deficiency    ??? Varicella    ??? Vitamin D deficiency        Current Outpatient Medications   Medication Sig Dispense Refill   ??? albuterol (PROAIR HFA) 90 mcg/actuation inhaler Inhale 2 puffs into the lungs every 6 hours as needed for Wheezing. 3 Inhaler 3   ??? albuterol (PROAIR HFA) 90 mcg/actuation Inhl inhaler Inhale 2 puffs into the lungs every 6 hours as needed for Wheezing. 3 Inhaler 3   ??? cetirizine (ZYRTEC) 10 MG tablet Take 10 mg by mouth daily as needed for Allergies.     ??? cholecalciferol, vitamin D3, (VITAMIN D3) 5,000  unit Tab Take by mouth.     ??? escitalopram oxalate (LEXAPRO) 20 MG tablet Take 1 tablet (20 mg total) by mouth daily. 90 tablet 3   ??? FERROUS SULFATE (IRON ORAL) Take by mouth.     ??? fluticasone (FLONASE) 50 mcg/actuation nasal spray SHAKE LIQUID AND USE 1 TO 2 SPRAYS IN EACH NOSTRIL DAILY AS NEEDED 16 g 0   ??? fluticasone propion-salmeteroL (ADVAIR DISKUS) 500-50 mcg/dose DsDv Inhale 1 puff into the lungs 2 times a day. 3 each 3   ??? melatonin 10 mg Cap Take by mouth.     ??? montelukast (SINGULAIR) 10 mg tablet Take 1 tablet (10 mg total) by mouth at bedtime. 90 tablet 3   ??? predniSONE (DELTASONE) 20 MG tablet 1 by mouth twice daily x 3 days then 1 by mouth once daily x 4 days 10 tablet 0   ??? budesonide (PULMICORT) 0.5 mg/2 mL nebulizer solution Add one 2mL ampule (0.5mg ) into 250 mL saline in nasal rinse bottle and irrigate both sides of your nose with entire volume twice a day.  60 ampule 6   ??? dextroamphetamine-amphetamine (ADDERALL XR) 20 MG 24 hr capsule Take 2 capsules (40 mg total) by mouth every morning for 30 days. 60 capsule 0   ??? dextroamphetamine-amphetamine (ADDERALL XR) 20 MG 24 hr capsule Take 2 capsules (40 mg total) by mouth every morning for 30 days. 60 capsule 0   ??? dextroamphetamine-amphetamine (ADDERALL XR) 20 MG 24 hr capsule Take 2 capsules (40 mg total) by mouth every morning for 30 days. 60 capsule 0   ??? EPINEPHrine (AUVI-Q) 0.3 mg/0.3 mL AtIn injection Inject 0.3 mLs (0.3 mg total) into the muscle once for 1 dose. Indications: Anaphylaxis, person at risk of anaphylaxis, patient starting allergy injections 1 each 0     No current facility-administered medications for this visit.          ROS:   Review of Systems  as above   Objective:   Physical Exam      Wt Readings from Last 3 Encounters:   01/20/21 169 lb (76.7 kg)   09/30/20 169 lb 3.2 oz (76.7 kg)   07/31/20 165 lb (74.8 kg)       Vitals:    01/20/21 1426   BP: 155/86   Pulse: 80   SpO2: 98%   Weight: 169 lb (76.7 kg)   Height: 5' 6  (1.676 m)     Body mass index is 27.28 kg/m??.  Body surface area is 1.89 meters squared.    NAD  Psych: A&Ox3; reactive affect; no obv thought d/o       Lab Results   Component Value Date    WBC 7.9 09/30/2017    HGB 12.3 09/30/2017    HCT 37.3 09/30/2017    MCV 88.7 09/30/2017    PLT 229 09/30/2017       Lab Results   Component Value Date    HGBA1C 5.9 (H) 10/01/2020     No components found for: GLUF,  MICROALBUR,  LDLCALC,  CREATININE    Lab Results   Component Value Date    GLUCOSE 96 10/01/2020       No results found for: LIPIDCOMM    Lab Results   Component Value Date    TSH 1.79 10/01/2020     Lab Results   Component Value Date    VITAMINB12 >1506 (H) 10/01/2020         Upcoming Health Maintenance     Hepatitis C Screening (MyChart) (Once)  Overdue - never done    Depression Monitoring (PHQ-9) (Every 3 Months)  Overdue - never done    Comprehensive Physical Exam (Yearly)  Overdue - never done    Immunization: COVID-19 (1)  Overdue - never done    HIV Screening (Once)  Overdue - never done    Immunization: DTaP/Tdap/Td (1 - Tdap)  Overdue - never done    Immunization: Zoster (1 of 2)  Overdue - never done    Cervical Cancer Screening/Pap Smear (MyChart) (Every 3 Years)  Overdue since 05/13/2020    Alcohol Misuse Screening (Yearly)  Next due on 09/30/2021    Mammogram (MyChart) (Every 2 Years)  Next due on 08/20/2022    Colorectal Cancer Screening (Cologuard - Every 3 Years)  Next due on 12/04/2022    Diabetes Screening (Every 3 Years)  Next due on 10/02/2023    Lipid Panel (Every 5 Years)  Next due on 10/01/2025    Immunization: Pneumococcal (2 of 2 - PPSV23)  Next due on 06/18/2034  Immunization History   Administered Date(s) Administered   ??? Influenza, quadrivalent, intradermal, preservative-free 07/04/2017   ??? Influenza, quadrivalent, preservative-free 08/06/2015, 06/25/2016, 09/11/2018, 09/30/2020   ??? Influenza, unspecified 05/31/2008   ??? Pneumococcal polysaccharide, 23-valent 10/04/2008          Assessment/Plan:     TI: 230  TO: 253       A/P:  Patient Instructions     Encounter Diagnoses   Name Primary?   ??? Attention deficit disorder, unspecified hyperactivity presence  - psychiatry consult  - continue with the current treatment for now Yes   ??? Abnormal head CT  - neurology consult    ??? Attention deficit disorder (ADD) without hyperactivity    ??? Elevated BP without diagnosis of hypertension  - weight loss: goal  - diet: try to avoid late night snacking  - exercise:   - home BP checks: see below             RTC: 3 months    New meds:   none  Patient education given  none  Patient verbalized understanding & agreement of the proposed plan.

## 2021-01-20 NOTE — Unmapped (Addendum)
Encounter Diagnoses   Name Primary?   ??? Attention deficit disorder, unspecified hyperactivity presence  - psychiatry consult  - continue with the current treatment for now Yes   ??? Abnormal head CT  - neurology consult (consider an E consult)    ??? Attention deficit disorder (ADD) without hyperactivity    ??? Elevated BP without diagnosis of hypertension  - weight loss: goal  - diet: try to avoid late night snacking; smaller , more frequent meals  - exercise: work out routines more frequently  - home BP checks: see below

## 2021-01-21 NOTE — Unmapped (Signed)
After patient left yesterday when I was finishing my notes/ppw I saw her PHQ9: 16 (didn't complete section re how difficult): Q#9: marked @  Called yesterday evening: n/a; lvm  Also sent mychart msg: hasnt been read yet  This am called again, n/a and again left brief vm asking for call back

## 2021-01-26 MED ORDER — escitalopram oxalate (LEXAPRO) 20 MG tablet
20 | ORAL_TABLET | ORAL | 5 refills | Status: AC
Start: 2021-01-26 — End: 2021-02-05

## 2021-01-26 MED ORDER — montelukast (SINGULAIR) 10 mg tablet
10 | ORAL_TABLET | ORAL | 5 refills | Status: AC
Start: 2021-01-26 — End: 2021-10-27

## 2021-01-29 NOTE — Unmapped (Signed)
TC to pt to follow up on CM referral for elevated phq-9 score. No answer, message left. Awaiting call back.     Victorino December BSN, Research officer, [employer], [facility]  [phone]

## 2021-01-29 NOTE — Unmapped (Signed)
Referral Source:  Dr. Thedore Mins  Reason for Referral:  Elevated PHQ9 score  Contact Type:  Telephone  Planned Action(s):  Call patient  Date of Next Scheduled Follow-up:  02/02/21  Episode Enrollment Date: Not Found  Days Enrolled: Not Found    Call to patient, left VM requesting return call.  SW will call again early next week if no return call from patient sooner.    Elba Barman, MSW, [place], [place]  [phone]

## 2021-02-05 MED ORDER — escitalopram oxalate (LEXAPRO) 20 MG tablet
20 | ORAL_TABLET | Freq: Every day | ORAL | 3 refills | Status: AC
Start: 2021-02-05 — End: 2021-02-06

## 2021-02-05 NOTE — Unmapped (Signed)
Lvm to get pt pharmacy info.

## 2021-02-05 NOTE — Unmapped (Signed)
It should be 20mg  not 10mg   Will print and fax, cant find this pharmacy in epic to e-rx

## 2021-02-05 NOTE — Unmapped (Signed)
Patients pharmacy called the office stating they are in need of a verbal to refill patients lexapro.     They are requesting a 10mg  tablet although it looks like the patient has been prescribed 20mg  in the past.     Pharmacy needs a verbal and clarity       True fill mail order pharmacy   Phone number- [phone]  Fax number - [phone]

## 2021-02-06 MED ORDER — escitalopram oxalate (LEXAPRO) 20 MG tablet
20 | ORAL_TABLET | Freq: Every day | ORAL | 3 refills | Status: AC
Start: 2021-02-06 — End: ?

## 2021-02-06 NOTE — Unmapped (Signed)
Referral Source:  Dr. Thedore Mins  Reason for Referral:  Elevated PHQ9 score  Contact Type:  Telephone  Planned Action(s):  MyChart msg/await contact  Date of Next Scheduled Follow-up:  02/13/21  Episode Enrollment Date: Not Found  Days Enrolled: Not Found  ??  Call to patient, left VM requesting return call.     SW will send MyChart message to patient, requesting contact.  If no contact from patient in 1 week SW will close SW referral.    Elba Barman, MSW, LSW, CCM  [phone]

## 2021-02-13 NOTE — Unmapped (Signed)
SW has made several attempts to reach patient.  Left voicemails with no response from patient.  SW also sent MyChart message, which Epic indicates was read by patient on 02/09/21, however patient did not respond to SW.      Per policy SW will close the SW referral this date.    Elba Barman, MSW, [place], [place]  [phone]

## 2021-02-26 ENCOUNTER — Emergency Department (HOSPITAL_COMMUNITY)
Admission: EM | Admit: 2021-02-26 | Discharge: 2021-02-27 | Disposition: A | Discharge status: Home / Self Care | Payer: 59 | Attending: Emergency Medicine | Admitting: Emergency Medicine

## 2021-02-26 ENCOUNTER — Other Ambulatory Visit: Payer: Self-pay

## 2021-02-26 ENCOUNTER — Encounter (HOSPITAL_COMMUNITY): Payer: Self-pay | Admitting: Emergency Medicine

## 2021-02-26 DIAGNOSIS — J45901 Unspecified asthma with (acute) exacerbation: Secondary | ICD-10-CM

## 2021-02-26 DIAGNOSIS — R0602 Shortness of breath: Secondary | ICD-10-CM | POA: Diagnosis present

## 2021-02-26 DIAGNOSIS — J4541 Moderate persistent asthma with (acute) exacerbation: Secondary | ICD-10-CM | POA: Diagnosis not present

## 2021-02-26 DIAGNOSIS — Z7951 Long term (current) use of inhaled steroids: Secondary | ICD-10-CM | POA: Insufficient documentation

## 2021-02-26 HISTORY — DX: Unspecified asthma, uncomplicated: J45.909

## 2021-02-26 NOTE — ED Triage Notes (Signed)
Pt came in SOB, has been struggling w/ her asthma all day, using her rescue inhaler, and taking breathing treatments.  Pt is very sob in triage.

## 2021-02-27 ENCOUNTER — Emergency Department (HOSPITAL_COMMUNITY): Payer: 59

## 2021-02-27 MED ORDER — ALBUTEROL (5 MG/ML) CONTINUOUS INHALATION SOLN
10.0000 mg/h | INHALATION_SOLUTION | RESPIRATORY_TRACT | Status: AC
  Administered 2021-02-27: 10 mg/h via RESPIRATORY_TRACT
  Filled 2021-02-27: qty 20

## 2021-02-27 MED ORDER — PREDNISONE 20 MG PO TABS: ORAL_TABLET | ORAL | 0 refills | Status: AC

## 2021-02-27 MED ORDER — ALBUTEROL (5 MG/ML) CONTINUOUS INHALATION SOLN
10.0000 mg/h | INHALATION_SOLUTION | RESPIRATORY_TRACT | Status: DC
  Administered 2021-02-27: 10 mg/h via RESPIRATORY_TRACT
  Filled 2021-02-27: qty 20

## 2021-02-27 MED ORDER — IPRATROPIUM BROMIDE 0.02 % IN SOLN
1.0000 mg | Freq: Once | RESPIRATORY_TRACT | Status: AC
  Administered 2021-02-27: 1 mg via RESPIRATORY_TRACT
  Filled 2021-02-27: qty 5

## 2021-02-27 MED ORDER — PREDNISONE 20 MG PO TABS
60.0000 mg | ORAL_TABLET | Freq: Once | ORAL | Status: AC
  Administered 2021-02-27: 60 mg via ORAL
  Filled 2021-02-27: qty 3

## 2021-02-27 MED ORDER — ALBUTEROL SULFATE (2.5 MG/3ML) 0.083% IN NEBU: 2.5000 mg | INHALATION_SOLUTION | Freq: Four times a day (QID) | RESPIRATORY_TRACT | 12 refills | Status: AC | PRN

## 2021-02-27 NOTE — ED Provider Notes (Signed)
[facility] EMERGENCY DEPARTMENT Provider Note   CSN: [id number] Arrival date & time: 02/26/21  2329     History Chief Complaint  Patient presents with  . Asthma    Nicole Brandt is a 50 y.o. female.   Asthma This is a chronic problem. The current episode started more than 2 days ago. The problem occurs constantly. The problem has been gradually worsening. Associated symptoms include shortness of breath. Pertinent negatives include no chest pain, no abdominal pain and no headaches. Nothing aggravates the symptoms. Nothing relieves the symptoms. Treatments tried: albuterol inhaler/nebulizer. The treatment provided mild relief.       Past Medical History:  Diagnosis Date  . Asthma     There are no problems to display for this patient.   History reviewed. No pertinent surgical history.   OB History   No obstetric history on file.     No family history on file.     Home Medications Prior to Admission medications   Medication Sig Start Date End Date Taking? Authorizing Provider  acetaminophen (TYLENOL) 500 MG tablet Take 1,000 mg by mouth every 6 (six) hours as needed for moderate pain or headache.   Yes [provider]  albuterol (PROVENTIL) (2.5 MG/3ML) 0.083% nebulizer solution Take 3 mLs (2.5 mg total) by nebulization every 6 (six) hours as needed for wheezing or shortness of breath. 02/27/21  Yes Yasmina Chico, Barbara Cower, MD  albuterol (VENTOLIN HFA) 108 (90 Base) MCG/ACT inhaler Inhale 2 puffs into the lungs every 6 (six) hours as needed for wheezing or shortness of breath. 12/29/18  Yes [provider]  amphetamine-dextroamphetamine (ADDERALL XR) 20 MG 24 hr capsule Take 40 mg by mouth every morning. 12/23/20  Yes [provider]  cetirizine (ZYRTEC) 10 MG tablet Take 10 mg by mouth daily as needed for allergies.   Yes [provider]  Cholecalciferol 125 MCG (5000 UT) TABS Take 5,000 Units by mouth daily.   Yes [provider]  EPINEPHrine 0.3 mg/0.3 mL IJ SOAJ injection Inject 0.3 mLs into the muscle as needed for anaphylaxis. 11/13/20  Yes [provider]  escitalopram (LEXAPRO) 20 MG tablet Take 20 mg by mouth daily. 02/06/21  Yes [provider]  ferrous fumarate (HEMOCYTE - 106 MG FE) 325 (106 Fe) MG TABS tablet Take 325 mg by mouth daily.   Yes [provider]  fluticasone (FLONASE) 50 MCG/ACT nasal spray Place 1 spray into both nostrils in the morning and at bedtime. 08/23/16  Yes [provider]  ibuprofen (ADVIL) 200 MG tablet Take 600 mg by mouth every 6 (six) hours as needed for headache or moderate pain.   Yes [provider]  Melatonin 10 MG CAPS Take 10 mg by mouth at bedtime as needed (sleep).   Yes [provider]  montelukast (SINGULAIR) 10 MG tablet Take 10 mg by mouth at bedtime. 10/20/20  Yes [provider]  predniSONE (DELTASONE) 20 MG tablet 2 tabs po daily x 4 days 02/27/21  Yes Carlosdaniel Grob, Barbara Cower, MD    Allergies    Patient has no known allergies.  Review of Systems   Review of Systems  Respiratory: Positive for shortness of breath.   Cardiovascular: Negative for chest pain.  Gastrointestinal: Negative for abdominal pain.  Neurological: Negative for headaches.  All other systems reviewed and are negative.   Physical Exam Updated Vital Signs BP (!) 130/45 (BP Location: Right Arm)   Pulse 100   Temp 98.6 F (37  C) (Oral)   Resp 20   Ht 5\' 6"  (1.676 m)   Wt 72.6 kg   SpO2 99%   BMI 25.82 kg/m   Physical Exam Vitals and nursing note reviewed.  Constitutional:      Appearance: She is well-developed.  HENT:     Head: Normocephalic and atraumatic.     Mouth/Throat:     Mouth: Mucous membranes are moist.     Pharynx: Oropharynx is clear.  Eyes:     Pupils: Pupils are equal, round, and reactive to light.  Cardiovascular:     Rate and Rhythm: Normal rate and regular rhythm.  Pulmonary:     Effort: Tachypnea  present. No respiratory distress.     Breath sounds: Decreased air movement present. No stridor. Wheezing present.  Abdominal:     General: Abdomen is flat. There is no distension.  Musculoskeletal:        General: No swelling or tenderness. Normal range of motion.     Cervical back: Normal range of motion.  Skin:    General: Skin is warm and dry.  Neurological:     General: No focal deficit present.     Mental Status: She is alert.     ED Results / Procedures / Treatments   Labs (all labs ordered are listed, but only abnormal results are displayed) Labs Reviewed - No data to display  EKG None  Radiology DG Chest Portable 1 View  Result Date: 02/27/2021 CLINICAL DATA:  Dyspnea EXAM: PORTABLE CHEST 1 VIEW COMPARISON:  None. FINDINGS: The heart size and mediastinal contours are within normal limits. Both lungs are clear. The visualized skeletal structures are unremarkable. IMPRESSION: No active disease. Electronically Signed   By: 03/01/2021 MD   On: 02/27/2021 01:06    Procedures Procedures   Medications Ordered in ED Medications  albuterol (PROVENTIL,VENTOLIN) solution continuous neb (10 mg/hr Nebulization Not Given 02/27/21 0228)  albuterol (PROVENTIL,VENTOLIN) solution continuous neb (0 mg/hr Nebulization Stopped 02/27/21 0453)  ipratropium (ATROVENT) nebulizer solution 1 mg (1 mg Nebulization Given 02/27/21 0102)  predniSONE (DELTASONE) tablet 60 mg (60 mg Oral Given 02/27/21 0055)    ED Course  I have reviewed the triage vital signs and the nursing notes.  Pertinent labs & imaging results that were available during my care of the patient were reviewed by me and considered in my medical decision making (see chart for details).    MDM Rules/Calculators/A&P                          Multiple albuterol treatments and improving. Steroids as well. No indication for antibiotics at this time. Patient feels that she is pretty stable and ok for discharge.   Final Clinical  Impression(s) / ED Diagnoses Final diagnoses:  Moderate asthma with exacerbation, unspecified whether persistent    Rx / DC Orders ED Discharge Orders         Ordered    albuterol (PROVENTIL) (2.5 MG/3ML) 0.083% nebulizer solution  Every 6 hours PRN        02/27/21 0441    predniSONE (DELTASONE) 20 MG tablet        02/27/21 0441           Jerral Mccauley, 03/01/21, MD 02/27/21 [phone]

## 2021-02-27 NOTE — ED Notes (Signed)
Patient verbalized understanding of discharge instructions. Opportunity for questions and answers.  

## 2021-03-24 NOTE — Unmapped (Signed)
Patient was called to see if she wanted to make an appointment to get started on her immunotherapy

## 2021-04-03 ENCOUNTER — Ambulatory Visit: Payer: PRIVATE HEALTH INSURANCE

## 2021-04-16 ENCOUNTER — Ambulatory Visit: Payer: PRIVATE HEALTH INSURANCE

## 2021-04-16 ENCOUNTER — Institutional Professional Consult (permissible substitution): Admit: 2021-04-16 | Discharge: 2021-04-16 | Payer: PRIVATE HEALTH INSURANCE

## 2021-04-16 DIAGNOSIS — J3089 Other allergic rhinitis: Secondary | ICD-10-CM

## 2021-04-16 NOTE — Unmapped (Signed)
The patient presents today in the [facility] ENT office for her first allergy injection. Vials identified with the patient and the contents were reviewed.    On hand the patient has AuviQ. Proper education was provided for the patient in regards to the use of the epinephrine auto injector. Noted that it is only to be used for an anaphylactic emergency. If  the epinephrine auto injector is used the patient is too call 911 to get further assistance. The patient voiced clear understanding of this concept.    Anaphylactic symptoms reviewed with the patient. Reviewed the ABC's with the patient. Noted that airway takes priority, then breathing,  and finally circulation.     SOB, extreme coughing, fainting, and the feeling of impending doom can be signs of an anaphylactic reaction. Also noted that a breakout of full body hives can also be attributed to an anaphylactic reaction.    After reviewing the above information and acquiring the consent form the following doses was administered for the patient.    Vial test    Extract #1: 7 mm wheal.  Extract #2: 7 mm wheal.    Extract #1: Escalating: DM, CT, DG, M  Dose: 0.05 ml  Site: right arm  Administered by: Trudee Grip RN    Extract #2: Escalating: TR, GR, W  Dose: 0.05 ml  Site: left arm  Administered by: Trudee Grip RN    Johny Shock or Epipen on hand? Yes AuviQ on hand.    After the 20 minute wait the patient exhibited no signs of a local reaction. No signs of anaphylactic reaction was also noted.     Noted that the patient is welcome to contact our office for further questions or concerns. Future appointments scheduled.  Provided direct line to the allergy office.

## 2021-04-22 ENCOUNTER — Ambulatory Visit: Admit: 2021-04-22 | Discharge: 2021-04-22 | Payer: PRIVATE HEALTH INSURANCE

## 2021-04-22 ENCOUNTER — Institutional Professional Consult (permissible substitution): Admit: 2021-04-22 | Discharge: 2021-04-22 | Payer: PRIVATE HEALTH INSURANCE

## 2021-04-22 ENCOUNTER — Encounter

## 2021-04-22 DIAGNOSIS — F988 Other specified behavioral and emotional disorders with onset usually occurring in childhood and adolescence: Secondary | ICD-10-CM

## 2021-04-22 DIAGNOSIS — J3089 Other allergic rhinitis: Secondary | ICD-10-CM

## 2021-04-22 MED ORDER — AMOXicillin (AMOXIL) 875 MG tablet
875 | ORAL_TABLET | Freq: Two times a day (BID) | ORAL | 0 refills | Status: AC
Start: 2021-04-22 — End: 2021-04-29

## 2021-04-22 MED ORDER — dextroamphetamine-amphetamine (ADDERALL XR) 20 MG 24 hr capsule
20 | ORAL_CAPSULE | Freq: Every morning | ORAL | 0 refills | Status: AC
Start: 2021-04-22 — End: 2021-07-23

## 2021-04-22 MED ORDER — dextroamphetamine-amphetamine (ADDERALL XR) 20 MG 24 hr capsule
20 | ORAL_CAPSULE | Freq: Every morning | ORAL | 0 refills | Status: AC
Start: 2021-04-22 — End: 2021-05-01

## 2021-04-22 MED ORDER — dextroamphetamine-amphetamine (ADDERALL XR) 20 MG 24 hr capsule
20 | ORAL_CAPSULE | Freq: Every morning | ORAL | 0 refills | Status: AC
Start: 2021-04-22 — End: 2021-06-09

## 2021-04-22 MED ORDER — budesonide-formoteroL (SYMBICORT) 160-4.5 mcg/actuation inhaler
160-4.5 | Freq: Two times a day (BID) | RESPIRATORY_TRACT | 3 refills | Status: AC
Start: 2021-04-22 — End: ?

## 2021-04-22 NOTE — Unmapped (Addendum)
Encounter Diagnoses   Name Primary?    Attention deficit disorder, unspecified hyperactivity presence  - stable; continue with the current treatment   Yes    Abnormal head CT  - unable to get consult with [facility]  - consult with [facility] neurology     Moderate persistent asthma without complication  - trial with change advair to symbicort (good rx discount with [facility])     Adverse effect of vaccine, initial encounter  - cold compresses: watch for signs of cellulitis

## 2021-04-22 NOTE — Unmapped (Signed)
Extract #1: Escalating: DM, CT, DG, M  Dose: 0.1 ml  Site: right arm  Administered by: G.Advay Volante RN    Extract #2: Escalating: TR, GR, W  Dose: 0.1 ml  Site: left arm  Administered by: G.Jocilynn Grade RN    No noted reactions from the previous injection. The patient denies any fever or any other symptoms from the last 24 hours.     Epinephrine injector on hand today.

## 2021-04-22 NOTE — Unmapped (Signed)
Subjective   HPI:   Patient ID: Mary [name] is a 50 y.o. female.    The following HPI was reviewed and copied forward (with edits) from a note written by me on 01/20/21. I have reviewed and updated the history, physical exam, data, assessment, and plan of the note as detailed below so that it reflects my evaluation and management of the patient.       The patient's chart was reviewed prior to the visit as part of pre-visit planning  No chief complaint on file.       ADD   C/w AXR 40mg  once daily, concentration is doing ok (past did better w 45mg  but $$$ issues)    Is keen to reduce dose and eventually dc, would like to see psych later   Denies any s/e   Denies diversion or st/drug abuse    Recent covid vax: having some redness and swelling latreal aspect v l shldr,   Recent sinusitis: partial rx expired atbx, felt better but sx coming back; ongoing sinus rinse; unable to see her ENT,   Asthma: $$$ issues with advair, unable to fill        Controlled Substance Monitoring 08/16/2019 11/12/2019 02/11/2020 06/25/2020 09/30/2020 01/20/2021 04/22/2021   OARRS/eKASPER Status Reviewed Reviewed Reviewed Reviewed Reviewed Reviewed Reviewed   OARRS/eKASPER Consistent with Prescriber Expectation [name]     I have completed the required OARRS/eKASPER documentation for this patient on 04/22/2021.  Georgeanna Radziewicz [name]      Past Medical History:   Diagnosis Date   ??? ADD (attention deficit disorder)    ??? Allergy    ??? Anxiety    ??? Asthma    ??? Cancer (CMS Dx)    ??? Cervical dysplasia    ??? Chronic back pain    ??? Depression    ??? Diverticulitis    ??? Environmental allergies    ??? Iron deficiency    ??? Varicella    ??? Vitamin D deficiency        Current Outpatient Medications   Medication Sig Dispense Refill   ??? [START ON 06/21/2021] dextroamphetamine-amphetamine (ADDERALL XR) 20 MG 24 hr capsule Take 2 capsules (40 mg total) by mouth every morning for 30 days. 60 capsule 0   ??? [START ON 05/22/2021] dextroamphetamine-amphetamine (ADDERALL XR) 20  MG 24 hr capsule Take 2 capsules (40 mg total) by mouth every morning for 30 days. 60 capsule 0   ??? dextroamphetamine-amphetamine (ADDERALL XR) 20 MG 24 hr capsule Take 2 capsules (40 mg total) by mouth every morning for 30 days. 60 capsule 0   ??? albuterol (PROAIR HFA) 90 mcg/actuation inhaler Inhale 2 puffs into the lungs every 6 hours as needed for Wheezing. 3 Inhaler 3   ??? albuterol (PROAIR HFA) 90 mcg/actuation Inhl inhaler Inhale 2 puffs into the lungs every 6 hours as needed for Wheezing. 3 Inhaler 3   ??? AMOXicillin (AMOXIL) 875 MG tablet Take 1 tablet (875 mg total) by mouth 2 times a day for 7 days. 14 tablet 0   ??? budesonide (PULMICORT) 0.5 mg/2 mL nebulizer solution Add one 2mL ampule (0.5mg ) into 250 mL saline in nasal rinse bottle and irrigate both sides of your nose with entire volume twice a day. 60 ampule 6   ??? budesonide-formoteroL (SYMBICORT) 160-4.5 mcg/actuation inhaler Inhale 1 puff into the lungs 2 times a day. 10.2 g 3   ??? cetirizine (ZYRTEC) 10 MG tablet Take 10 mg by mouth  daily as needed for Allergies.     ??? cholecalciferol, vitamin D3, (VITAMIN D3) 5,000 unit Tab Take by mouth.     ??? EPINEPHrine (AUVI-Q) 0.3 mg/0.3 mL AtIn injection Inject 0.3 mLs (0.3 mg total) into the muscle once for 1 dose. Indications: Anaphylaxis, person at risk of anaphylaxis, patient starting allergy injections 1 each 0   ??? escitalopram oxalate (LEXAPRO) 20 MG tablet Take 1 tablet (20 mg total) by mouth daily. 90 tablet 3   ??? FERROUS SULFATE (IRON ORAL) Take by mouth.     ??? fluticasone (FLONASE) 50 mcg/actuation nasal spray SHAKE LIQUID AND USE 1 TO 2 SPRAYS IN EACH NOSTRIL DAILY AS NEEDED 16 g 0   ??? melatonin 10 mg Cap Take by mouth.     ??? montelukast (SINGULAIR) 10 mg tablet TAKE ONE TABLET BY MOUTH EVERY NIGHT AT BEDTIME 30 tablet 5   ??? predniSONE (DELTASONE) 20 MG tablet 1 by mouth twice daily x 3 days then 1 by mouth once daily x 4 days 10 tablet 0     No current facility-administered medications for this  visit.          ROS:   Review of Systems  as above   Objective:   Physical Exam      Wt Readings from Last 3 Encounters:   01/20/21 169 lb (76.7 kg)   09/30/20 169 lb 3.2 oz (76.7 kg)   07/31/20 165 lb (74.8 kg)       There were no vitals filed for this visit.  There is no height or weight on file to calculate BMI.  There is no height or weight on file to calculate BSA.    NAD  Psych: A&Ox3; reactive affect; no obv thought d/o       Lab Results   Component Value Date    WBC 7.9 09/30/2017    HGB 12.3 09/30/2017    HCT 37.3 09/30/2017    MCV 88.7 09/30/2017    PLT 229 09/30/2017       Lab Results   Component Value Date    HGBA1C 5.9 (H) 10/01/2020     No components found for: GLUF,  MICROALBUR,  LDLCALC,  CREATININE    Lab Results   Component Value Date    GLUCOSE 96 10/01/2020       No results found for: [facility]    Lab Results   Component Value Date    TSH 1.79 10/01/2020     Lab Results   Component Value Date    VITAMINB12 >1506 (H) 10/01/2020          Upcoming Health Maintenance     Hepatitis C Screening (MyChart) (Once)  Overdue - never done    Comprehensive Physical Exam (Yearly)  Overdue - never done    HIV Screening (Once)  Overdue - never done    Immunization: DTaP/Tdap/Td (1 - Tdap)  Overdue - never done    Immunization: Pneumococcal (2 - PCV)  Overdue since 10/04/2009    Immunization: Zoster (1 of 2)  Overdue - never done    Cervical Cancer Screening/Pap Smear (MyChart) (Every 3 Years)  Overdue since 05/13/2020    Depression Monitoring (PHQ-9) (Every 3 Months)  Next due on 04/30/2021    Immunization: COVID-19 (2 - Moderna series)  Next due on 05/18/2021    Immunization: Influenza (MyChart) (1)  Next due on 06/04/2021    Alcohol Misuse Screening (Yearly)  Next due on 09/30/2021    Mammogram (MyChart) (Every  2 Years)  Next due on 08/20/2022    Colorectal Cancer Screening (MyChart) (Cologuard - Every 3 Years)  Next due on 12/04/2022    Diabetes Screening (Every 3 Years)  Next due on 10/02/2023    Lipid Panel (Every 5  Years)  Next due on 10/01/2025         Immunization History   Administered Date(s) Administered   ??? COVID-19, mRNA, Moderna, age 77+ 04/20/2021   ??? Influenza, quadrivalent, intradermal, preservative-free 07/04/2017   ??? Influenza, quadrivalent, preservative-free 08/06/2015, 06/25/2016, 09/11/2018, 09/30/2020   ??? Influenza, unspecified 05/31/2008   ??? Pneumococcal polysaccharide, 23-valent 10/04/2008         Assessment/Plan:     TI: 243  TO: 308    This was a Video visit, including two-way audio and video communication, in lieu of an in-person visit. The patient provided verbal consent to participate in the telehealth visit.   I spent 20 minutes speaking with the patient, conducting an interview, performing a limited exam, and educating the patient on my assessment and plan. I also spent 5 minutes, on the same day as the encounter, preparing to see the patient (eg, review of tests), ordering medications, tests, or procedures, documenting clinical information in the electronic or other health record and performing non-face-to-face activities.          A/P:  Patient Instructions     Encounter Diagnoses   Name Primary?   ??? Attention deficit disorder, unspecified hyperactivity presence  - stable; continue with the current treatment   Yes   ??? Abnormal head CT  - unable to get consult with [facility]  - consult with [facility] neurology    ??? Moderate persistent asthma without complication  - trial with change advair to symbicort (good rx discount with [facility])    ??? Adverse effect of vaccine, initial encounter  - cold compresses: watch for signs of cellulitis             RTC: 3 months    New meds:   none  Patient education given  none  Patient verbalized understanding & agreement of the proposed plan.

## 2021-04-29 ENCOUNTER — Institutional Professional Consult (permissible substitution): Admit: 2021-04-29 | Discharge: 2021-04-29 | Payer: PRIVATE HEALTH INSURANCE

## 2021-04-29 DIAGNOSIS — J3089 Other allergic rhinitis: Secondary | ICD-10-CM

## 2021-04-29 NOTE — Unmapped (Signed)
Extract #1: Escalating: DM, CT, DG, M  Dose: 0.15 ml  Site: right arm  Administered by: Carlynn Purl MA    Extract #2: Escalating: TR, GR, W  Dose: 0.15 ml  Site: left arm  Administered by: Carlynn Purl    No noted reactions from the previous injection. The patient denies any fever or any other symptoms from the last 24 hours.     Epinephrine injector on hand today.

## 2021-05-01 MED ORDER — dextroamphetamine-amphetamine (ADDERALL XR) 20 MG 24 hr capsule
20 | ORAL_CAPSULE | Freq: Every morning | ORAL | 0 refills | Status: AC
Start: 2021-05-01 — End: 2022-01-22

## 2021-05-01 NOTE — Unmapped (Signed)
[facility] pharmacy is calling stating they were only able to fill 33 Adderall tablets out of 60 so they are requesting for Dr. Thedore Mins to send a prescription for 27 tablets so they can fill the remaining when they get restocked.    [facility] - [phone]

## 2021-05-06 ENCOUNTER — Institutional Professional Consult (permissible substitution): Admit: 2021-05-06 | Discharge: 2021-05-06 | Payer: PRIVATE HEALTH INSURANCE

## 2021-05-06 DIAGNOSIS — J3089 Other allergic rhinitis: Secondary | ICD-10-CM

## 2021-05-06 NOTE — Unmapped (Signed)
Extract #1: Escalating: DM, CT, DG, M  Dose: 0.2 ml  Site: right arm  Administered by: G.Addis Bennie RN    Extract #2: Escalating: TR, GR, W  Dose: 0.2 ml  Site: left arm  Administered by: G.Remus Hagedorn RN    No noted reactions from the previous injection. The patient denies any fever or any other symptoms from the last 24 hours.     Epinephrine injector on hand today.

## 2021-05-13 ENCOUNTER — Institutional Professional Consult (permissible substitution): Admit: 2021-05-13 | Discharge: 2021-05-13 | Payer: PRIVATE HEALTH INSURANCE

## 2021-05-13 DIAGNOSIS — J3089 Other allergic rhinitis: Secondary | ICD-10-CM

## 2021-05-13 NOTE — Unmapped (Signed)
Extract #1: Escalating: DM, CT, DG, M  Dose: 0.25 ml  Site: right arm  Administered by: G.Brayln Duque RN    Extract #2: Escalating: TR, GR, W  Dose: 0.25 ml  Site: left arm  Administered by: G.Yeila Morro RN    No noted reactions from the previous injection. The patient denies any fever or any other symptoms from the last 24 hours.     Epinephrine injector on hand today.

## 2021-05-17 MED ORDER — dextroamphetamine-amphetamine (ADDERALL XR) 20 MG 24 hr capsule
20 | ORAL_CAPSULE | Freq: Every morning | ORAL | 0 refills | Status: AC
Start: 2021-05-17 — End: 2021-07-23

## 2021-05-19 ENCOUNTER — Institutional Professional Consult (permissible substitution): Admit: 2021-05-19 | Discharge: 2021-05-19 | Payer: PRIVATE HEALTH INSURANCE

## 2021-05-19 DIAGNOSIS — J3089 Other allergic rhinitis: Secondary | ICD-10-CM

## 2021-05-19 NOTE — Unmapped (Signed)
Extract #1: Escalating: DM, CT, DG, M  Dose: 0.3 ml  Site: right arm  Administered by: G.Adalid Beckmann RN    Extract #2: Escalating: TR, GR, W  Dose: 0.3 ml  Site: left arm  Administered by: G.Alston Berrie RN    No noted reactions from the previous injection. The patient denies any fever or any other symptoms from the last 24 hours.     Epinephrine injector on hand today.

## 2021-05-20 ENCOUNTER — Ambulatory Visit: Payer: PRIVATE HEALTH INSURANCE

## 2021-05-27 ENCOUNTER — Institutional Professional Consult (permissible substitution): Admit: 2021-05-27 | Discharge: 2021-05-27 | Payer: PRIVATE HEALTH INSURANCE

## 2021-05-27 DIAGNOSIS — J3089 Other allergic rhinitis: Secondary | ICD-10-CM

## 2021-05-27 NOTE — Unmapped (Signed)
Extract #1: Escalating: DM, CT, DG, M  Dose: 0.35 ml  Site: right arm  Administered by: G.Petro Talent RN    Extract #2: Escalating: TR, GR, W  Dose: 0.35 ml  Site: left arm  Administered by: G.Shamari Trostel RN    No noted reactions from the previous injection. The patient denies any fever or any other symptoms from the last 24 hours.     Epinephrine injector on hand today.

## 2021-06-03 ENCOUNTER — Institutional Professional Consult (permissible substitution): Admit: 2021-06-03 | Discharge: 2021-06-03 | Payer: PRIVATE HEALTH INSURANCE

## 2021-06-03 DIAGNOSIS — J3089 Other allergic rhinitis: Secondary | ICD-10-CM

## 2021-06-03 NOTE — Unmapped (Signed)
Extract #1: Escalating: DM, CT, DG, M  Dose: 0.4 ml  Site: right arm  Administered by: G.Adeline Petitfrere RN    Extract #2: Escalating: TR, GR, W  Dose: 0.4 ml  Site: left arm  Administered by: G.Jabria Loos RN    No noted reactions from the previous injection. The patient denies any fever or any other symptoms from the last 24 hours.     Epinephrine injector on hand today.

## 2021-06-05 NOTE — Unmapped (Signed)
Pt has sent 2 mychart messages about this, do you know if this medication has been approved or denied?

## 2021-06-09 MED ORDER — dextroamphetamine-amphetamine (ADDERALL XR) 20 MG 24 hr capsule
20 | ORAL_CAPSULE | Freq: Every morning | ORAL | 0 refills | Status: AC
Start: 2021-06-09 — End: 2021-07-23

## 2021-06-09 NOTE — Unmapped (Signed)
This is a duplicate prior authorization request. Please see 06/04/2021 encounter. Prior Berkley Harvey has been submitted via Fax. Routing back to clinic update that this encounter is being closed as a duplicate/non-applicable.

## 2021-06-09 NOTE — Unmapped (Signed)
Refill Request. PA already submitted for the second time.   If you look in the encounter pt said that she can get it with the good Rx.

## 2021-06-10 ENCOUNTER — Ambulatory Visit: Payer: PRIVATE HEALTH INSURANCE

## 2021-06-11 ENCOUNTER — Institutional Professional Consult (permissible substitution): Admit: 2021-06-11 | Discharge: 2021-06-11 | Payer: PRIVATE HEALTH INSURANCE

## 2021-06-11 ENCOUNTER — Ambulatory Visit: Admit: 2021-06-11 | Discharge: 2021-06-11 | Payer: PRIVATE HEALTH INSURANCE

## 2021-06-11 DIAGNOSIS — J3089 Other allergic rhinitis: Secondary | ICD-10-CM

## 2021-06-11 DIAGNOSIS — J3489 Other specified disorders of nose and nasal sinuses: Secondary | ICD-10-CM

## 2021-06-11 MED ORDER — predniSONE (DELTASONE) 10 MG tablet
10 | ORAL_TABLET | ORAL | 0 refills | Status: AC
Start: 2021-06-11 — End: 2021-06-26

## 2021-06-11 NOTE — Unmapped (Signed)
Extract #1: Escalating: DM, CT, DG, M  Dose: 0.35 ml  Site: right arm  Administered by: G.Kentley Blyden RN    Extract #2: Escalating: TR, GR, W  Dose: 0.35 ml  Site: left arm  Administered by: G.Annamaria Salah RN    Increased allergy symptoms. Will decrease dose and will increase accordingly for the next appointment.

## 2021-06-11 NOTE — Unmapped (Signed)
[facility]  Department of Otolaryngology--Head and Neck Surgery  Division of Rhinology, Allergy, and Anterior Skull Base Surgery  Blair Heys, MD, PhD, [facility] Building  [address] [place], [place] [address]  Telephone: [phone]        Pt name: Mary [name]  MRN: [id number]      History Present Illness on 07/31/2020:  This is a 50 y.o. female who presents with chronic sinonasal symptomatology including nasal obstruction and congestion along with mucus production and decrease sense of smell.  She has a history of asthma.  She was allergy tested as a child and was positive to pretty much everything.  She is currently using fluticasone nasal spray along with Singulair.  She continues to be symptomatic despite consistent daily usage of these medications for months.    Interval history on November 13, 2020:  When I last saw her I started her on twice daily budesonide irrigations for chronic rhinosinusitis with nasal polyps.    Allergy testing performed with [place] at [facility] on October 24, 2020 was positive to multiple perennial and seasonal allergens, including on skin prick testing.    Today she reports that she continues to be symptomatic.      Interval history on June 11, 2021:  Since I last saw her she stopped using her budesonide irrigations due to logistical issues and ongoing life issues.  Her sinonasal symptoms subsequently worsened and she has recently restarted the budesonide rinses.  However, she is having quite a bit of sinonasal symptomatology.  She has also been having some malodorous parosmia.  She has been treated with several rounds of antibiotics which have not helped so far.    Radiology Findings:     Sinus CT scan performed at [facility] on September 22, 2020 shows on my read:    Mucosal thickening in the ethmoids, maxillary sinuses and OMCs.        Physical Exam:  General Appearance: well appearing, in no apparent distress  Anterior Rhinoscopy: Septum is deviated;   left.  Inferior turbinates are hypertrophied.  Other: None.     Procedure: Because of the patient???s history and symptomatology, I performed bilateral nasal endoscopy after obtaining verbal consent from the patient.  On inspection of the interior of the nasal cavity, I examined the inferior turbinates, middle turbinates, middle meatuses, superior meatuses and sphenoethmoid recesses.  Visualization of the superior meatuses and sphenoethmoid recesses was obscured by the middle turbinate and the septum.  All structures appeared normal/unremarkable except as noted below:    Edema of both middle turbinates     Impression:  Chronic rhinosinusitis with nasal polyps  Environmental allergies  Asthma  Septal deviation  Inferior turbinate hypertrophy    Plan:  15-day prednisone taper  Continue twice daily budesonide irrigations  If her sinonasal symptoms remain out of control then she will be a good candidate for endoscopic sinus surgery with septoplasty and inferior turbinate reductions.  We discussed this again.    She will otherwise follow-up with me in 6 months time.      Number and Complexity of Problems (Level 4)  During this encounter, I addressed 1 or more chronic illnesses with exacerbation, progression, or side effects of treatment    Risk of Complication and/or Morbidity or Mortality of Patient Management (Level  4)  The patient's management entails Moderate risk of complications and/or morbidity or mortality.          Attestation: in the case of  resident participation in the care of this patient, I attest that I have seen and examined the patient, formulated the plan and agree with the documentation.      Blair Heys, MD, PhD, FACS  Director, Division of Rhinology, Allergy, and Anterior Skull Base Surgery  Department of Otolaryngology--Head and Neck Surgery  [facility]  [facility]    CC:   Cleone Slim, MD    **This note was dictated with  voice-recognition software**

## 2021-06-15 NOTE — Unmapped (Signed)
Patient informed via MyChart message that a prior authorization has been approved for adderall XR.  until 06/15/2022. Click on RX Prior Authorization link below for prior auth details.

## 2021-06-17 ENCOUNTER — Ambulatory Visit: Payer: PRIVATE HEALTH INSURANCE

## 2021-06-24 ENCOUNTER — Ambulatory Visit: Payer: PRIVATE HEALTH INSURANCE

## 2021-06-26 ENCOUNTER — Ambulatory Visit: Payer: PRIVATE HEALTH INSURANCE

## 2021-07-01 ENCOUNTER — Institutional Professional Consult (permissible substitution): Admit: 2021-07-01 | Discharge: 2021-07-01 | Payer: PRIVATE HEALTH INSURANCE

## 2021-07-01 DIAGNOSIS — J3089 Other allergic rhinitis: Secondary | ICD-10-CM

## 2021-07-01 NOTE — Unmapped (Signed)
Extract #1: Escalating: DM, CT, DG, M  Dose: 0.4 ml  Site: right arm  Administered by: G.Sheli Dorin RN    Extract #2: Escalating: TR, GR, W  Dose: 0.4 ml  Site: left arm  Administered by: G.Effrey Davidow RN    Increased allergy symptoms. Will decrease dose and will increase accordingly for the next appointment.

## 2021-07-08 ENCOUNTER — Ambulatory Visit: Payer: PRIVATE HEALTH INSURANCE

## 2021-07-23 ENCOUNTER — Ambulatory Visit: Admit: 2021-07-23 | Discharge: 2021-07-23 | Payer: PRIVATE HEALTH INSURANCE

## 2021-07-23 DIAGNOSIS — J3089 Other allergic rhinitis: Secondary | ICD-10-CM

## 2021-07-23 MED ORDER — dextroamphetamine-amphetamine (ADDERALL XR) 20 MG 24 hr capsule
20 | ORAL_CAPSULE | Freq: Every morning | ORAL | 0 refills | Status: AC
Start: 2021-07-23 — End: 2021-10-27

## 2021-07-23 MED ORDER — AMOXicillin-clavulanate (AUGMENTIN) 875-125 mg per tablet
875-125 | ORAL_TABLET | Freq: Two times a day (BID) | ORAL | 0 refills | Status: AC
Start: 2021-07-23 — End: 2021-07-28

## 2021-07-23 MED ORDER — azelastine (ASTELIN) 137 mcg (0.1 %) nasal spray
137 | Freq: Two times a day (BID) | NASAL | 1 refills | Status: AC
Start: 2021-07-23 — End: ?

## 2021-07-23 NOTE — Unmapped (Signed)
Telehealth Visit due to Covid 19 pandemic  No chief complaint on file.    HPI:   Sinus Problems  - Husband diagnosed with COVID19, but she tested negative twice now. Reports hx of acquiring colds relatively easily. Hx of Nasal Polyps.  - Reports constant earache in her left ear, described as dull ache, as well as sinus headache and yellow-greenish drainage.   - Denies fever/chills, cough, has diminished sense of taste/smell more than usual (worsened since initial COVID19 diagnosis in 2019).   - Has tried BID Nasal wash (pressure wash from ENT; Budesonide nasal wash), alkaseltzer cold/cough. Given an antibiotic over 3 months ago by PCP, which cleared up. To note, she is on daily Zyrtec 10mg .   - Sinus issues have been going on for 2 weeks.   - She has tried NSAIDs a few times PRN, maybe 1-2x a day when she takes it.     ROS:  - As per HPI.    Past medical history, medications, allergies and problem visit reviewed and discussed with my medical assistant prior to visit.   Past Medical History:   Diagnosis Date   ??? ADD (attention deficit disorder)    ??? Allergy    ??? Anxiety    ??? Asthma    ??? Cancer (CMS Dx)    ??? Cervical dysplasia    ??? Chronic back pain    ??? Depression    ??? Diverticulitis    ??? Environmental allergies    ??? Iron deficiency    ??? Varicella    ??? Vitamin D deficiency      No outpatient medications have been marked as taking for the 07/23/21 encounter (Office Visit) with Vallery Ridge, MD.     No Known Allergies    Patient appearance:  Limited exam conducted via Video Visit.  Patient-Reported Vitals 05/16/2019   Weight 157 lb   Height 5' 6   BMI (Calculated) 25.39 kg/m2     General: No signs of distress   ENT/PULM: No audible congestion in voice, wheezing, or cough during exam   patient is speaking full sentences, breathing unlabored   PSYCH: Mood, behavior, and thought content appropriate     Assessment and Plan:  Diagnoses and all orders for this visit:    Allergic rhinitis due to other allergic  trigger, unspecified seasonality  -     azelastine (ASTELIN) 137 mcg (0.1 %) nasal spray; Use 1 spray into each nostril 2 times a day. Use in each nostril as directed  -     AMOXicillin-clavulanate (AUGMENTIN) 875-125 mg per tablet; Take 1 tablet by mouth 2 times a day for 5 days. Please take with food. Indications: Acute Bacterial Sinusitis      No follow-ups on file.    Acute Rhinosinusitus: Patient has insidiously worsening symptoms, but no fever that is indicative of bacterial infection. However, she is at risk given prior episodes of Bacterial Rhinosinusitis (last of which was in July 2022) and underlaying Nasal Polyps requiring BID Budesonide Rinses.  - Recommended course of Augmentin only if daily fevers/worsening sinus pressure and adding intranasal Azelastine to current regiment.. Continue daily PO antihistamine, Budesonide rinse BID. Recommended follow up in 1 month if not improved.     This was a Video visit, including two-way audio and video communication, in lieu of an in-person visit. The patient provided verbal consent to participate in the telehealth visit. Begin encounter: 8:40AM; End encounter: 8:51AM    I spent 11 minutes speaking with the  patient, conducting an interview, performing a limited exam, and educating the patient on my assessment and plan. I also spent 3 minutes, on the same day as the encounter, preparing to see the patient (eg, review of tests) and documenting clinical information in the electronic or other health record.    Vallery Ridge, MD  07/21/21

## 2021-07-29 ENCOUNTER — Institutional Professional Consult (permissible substitution)
Admit: 2021-07-29 | Discharge: 2021-07-29 | Payer: PRIVATE HEALTH INSURANCE | Attending: Clinical & Laboratory Immunology

## 2021-07-29 DIAGNOSIS — J3089 Other allergic rhinitis: Secondary | ICD-10-CM

## 2021-07-29 NOTE — Unmapped (Signed)
[facility]  ALLERGY TREATMENT VIAL WORKSHEET ENVIRONMENTAL  NAME: Mary [name], Mary [name] DOB: 01-Feb-1971 DR. SEDAGHAT DATE: 07/29/2021  EXTRACT#1 START DATE: 11/2020  DUST MITES: End Point: Milas Gain Dilution: Volume: ml   D. Farinae 4 1 0.2   D. Pteronyssinus 4 1 0.2   ANIMALS:      Cockroach 3 Not treated ---   Cat 5 2 0.2   Dog 3 Concentrate 0.2   MOLDS      Alternaria 4 1 0.2   Aspergillus 6 3 0.2   Helminthosporium 3 Not treated ---   Hormodendrum 3 Not treated ---   Candida Albicans 3 Not treated ---   Penicillium notatum 3 Not treated ---   Fusarium Oxysporium 5 2 0.2   Epicoccum 5 2 0.2   Pullularia 4 1 0.2   Rhizopus 3 Not treated ---   Total Volume of Antigens: 1.58ml   Total Volume of Diluent: 2.29ml   Total Volume of Glycerin: 0.64ml   Total Volume:   5ml   EXTRACT#2 START DATE: 11/2020  TREES      Cedar 5 2 0.2   Birch 6 3 0.2   Ash 5 Not treated ---   Pecan/ Hickory 5 2 0.2   Elm 6 3 0.2   Box Elder 3 Not treated ---   Oak 5 2 0.2   Cottonwood 4 Not treated ---   GRASSES      Johnson Grass 5 Not treated ---   Marcial Pacas Grass 5 2 0.2   [place] Grass 4 1 0.2   WEEDS      English Plantain 4 1 0.2   Ragweed 6 3 0.2   Lambs quarter 3 Not treated ---   Pigweed 3 Not treated ---   Sorrel 3 Not treated ---   Wormwood 5 2 0.2   Total Volume of Antigens: 2ml   Total Volume of Diluent: 2ml   Total volume of Glycerin: 1ml   Total Volume: 5ml   Prepared by: G.Jhaden Pizzuto RN 07/29/2021   Charges generated on paper invoice.    Extract lot numbers [id number]    D.Farinae: (16) [id number] EXP: 03/09/2025  D.Pteronyssinus: (10) [id number] EXP: 03/24/2025    Cat: [id number] EXP: 10/20/2022  Dog Hair: [id number] EXP: 04/18/2024    Alternaria: [id number] EXP: 07/29/2023  Aspergillus: [id number] EXP: 01/19/2024  Fusarium: [id number] EXP: 12/31/2023  Epiccocum: [id number] EXP: 08/19/2023  Pullaria: [id number] EXP: 12/22/2022    Cedar: [id number] EXP: 08/19/2023  Charletta Cousin, River: [id number] EXP: 06/06/2024  Pecan/Hickory: [id number] EXP: 04/18/2024  Elm,  American: [id number] EXP: 05/27/2023  Lafe Garin Red: [id number] EXP: 05/11/2024    English Plantain: [id number] EXP: 03/24/2024  Ragweed Short/Giant: [id number] EXP: 04/10/2023  Wormwood: [id number] EXP: 07/29/2023    Juliann Mule: [id number] EXP: 02/09/2023  [place] Grass: (10) [id number] EXP: 01/18/2024    Glycerin: [id number] EXP: 10/2026

## 2021-07-29 NOTE — Unmapped (Signed)
Patient hasn't had any reactions from their allergy injections. Doing well with their Immunotherapy. Mold and pollens avoidence guidelines were discussed. Patient was advised to continue their nasal rinsing and stay inside when mold and pollen counts were high. Patient was shone their allergy vials to verify their name and date of birth.

## 2021-08-05 ENCOUNTER — Institutional Professional Consult (permissible substitution): Admit: 2021-08-05 | Discharge: 2021-08-05 | Payer: PRIVATE HEALTH INSURANCE

## 2021-08-05 DIAGNOSIS — J3089 Other allergic rhinitis: Secondary | ICD-10-CM

## 2021-08-05 NOTE — Unmapped (Signed)
Extract #1: Escalating: DM, CT, DG, M  Dose: 0.05 ml  Site: right arm  Administered by: G.Burle Kwan RN    Extract #2: Escalating: TR, GR, W  Dose: 0.05 ml  Site: left arm  Administered by: G.Anwita Mencer RN    No noted reactions from the previous injection. The patient denies any fever or any other symptoms from the last 24 hours.

## 2021-08-12 ENCOUNTER — Ambulatory Visit: Payer: PRIVATE HEALTH INSURANCE

## 2021-08-13 ENCOUNTER — Institutional Professional Consult (permissible substitution): Admit: 2021-08-13 | Discharge: 2021-08-13 | Payer: PRIVATE HEALTH INSURANCE

## 2021-08-13 DIAGNOSIS — J3089 Other allergic rhinitis: Secondary | ICD-10-CM

## 2021-08-13 NOTE — Unmapped (Signed)
Extract #1: Escalating: DM, CT, DG, M  Dose: 0.1 ml  Site: right arm  Administered by: G.Shaquera Ansley RN    Extract #2: Escalating: TR, GR, W  Dose: 0.1 ml  Site: left arm  Administered by: G.Aric Jost RN    No noted reactions from the previous injection. The patient denies any fever or any other symptoms from the last 24 hours.

## 2021-08-19 ENCOUNTER — Ambulatory Visit: Payer: PRIVATE HEALTH INSURANCE

## 2021-08-26 ENCOUNTER — Institutional Professional Consult (permissible substitution)
Admit: 2021-08-26 | Discharge: 2021-08-26 | Payer: PRIVATE HEALTH INSURANCE | Attending: Clinical & Laboratory Immunology

## 2021-08-26 DIAGNOSIS — J3089 Other allergic rhinitis: Secondary | ICD-10-CM

## 2021-08-26 NOTE — Unmapped (Signed)
Patient hasn't had any reactions from their allergy injections. Doing well with their Immunotherapy. Mold and pollens avoidence guidelines were discussed. Patient was advised to continue their nasal rinsing and stay inside when mold and pollen counts were high. Patient was shone their allergy vials to verify their name and date of birth.

## 2021-09-02 ENCOUNTER — Ambulatory Visit: Payer: PRIVATE HEALTH INSURANCE

## 2021-09-04 ENCOUNTER — Ambulatory Visit: Payer: PRIVATE HEALTH INSURANCE

## 2021-09-09 ENCOUNTER — Ambulatory Visit: Payer: PRIVATE HEALTH INSURANCE

## 2021-09-16 ENCOUNTER — Institutional Professional Consult (permissible substitution)
Admit: 2021-09-16 | Discharge: 2021-09-16 | Payer: PRIVATE HEALTH INSURANCE | Attending: Clinical & Laboratory Immunology

## 2021-09-16 DIAGNOSIS — J3089 Other allergic rhinitis: Secondary | ICD-10-CM

## 2021-09-16 NOTE — Unmapped (Signed)
Patient hasn't had any reactions from their allergy injections. Doing well with their Immunotherapy. Mold and pollens avoidence guidelines were discussed. Patient was advised to continue their nasal rinsing and stay inside when mold and pollen counts were high. Patient was shone their allergy vials to verify their name and date of birth.

## 2021-09-23 ENCOUNTER — Institutional Professional Consult (permissible substitution): Admit: 2021-09-23 | Discharge: 2021-09-23 | Payer: PRIVATE HEALTH INSURANCE

## 2021-09-23 DIAGNOSIS — J3089 Other allergic rhinitis: Secondary | ICD-10-CM

## 2021-09-23 NOTE — Unmapped (Signed)
Extract #1: Escalating: DM, CT, DG, M  Dose: 0.1 ml  Site: right arm  Administered by: G.Yanelie Abraha RN    Extract #2: Escalating: TR, GR, W  Dose: 0.1 ml  Site: left arm  Administered by: G.Rewa Weissberg RN    No noted reactions from the previous injection. The patient denies any fever or any other symptoms from the last 24 hours.

## 2021-09-30 ENCOUNTER — Institutional Professional Consult (permissible substitution): Admit: 2021-09-30 | Discharge: 2021-09-30 | Payer: PRIVATE HEALTH INSURANCE

## 2021-09-30 DIAGNOSIS — J3089 Other allergic rhinitis: Secondary | ICD-10-CM

## 2021-09-30 NOTE — Unmapped (Signed)
Extract #1: Escalating: DM, CT, DG, M  Dose: 0.15 ml  Site: right arm  Administered by: G.Clara Smolen RN    Extract #2: Escalating: TR, GR, W  Dose: 0.15 ml  Site: left arm  Administered by: G.Ashla Murph RN    No noted reactions from the previous injection. The patient denies any fever or any other symptoms from the last 24 hours.

## 2021-10-07 ENCOUNTER — Institutional Professional Consult (permissible substitution): Admit: 2021-10-07 | Discharge: 2021-10-07 | Payer: PRIVATE HEALTH INSURANCE

## 2021-10-07 DIAGNOSIS — J3089 Other allergic rhinitis: Secondary | ICD-10-CM

## 2021-10-07 NOTE — Unmapped (Signed)
Extract #1: Escalating: DM, CT, DG, M  Dose: 0.2 ml  Site: right arm  Administered by: G.Ellen Goris RN    Extract #2: Escalating: TR, GR, W  Dose: 0.2 ml  Site: left arm  Administered by: G.Joyanne Eddinger RN    No noted reactions from the previous injection. The patient denies any fever or any other symptoms from the last 24 hours.

## 2021-10-14 ENCOUNTER — Institutional Professional Consult (permissible substitution): Admit: 2021-10-14 | Payer: PRIVATE HEALTH INSURANCE

## 2021-10-14 DIAGNOSIS — J3089 Other allergic rhinitis: Secondary | ICD-10-CM

## 2021-10-14 NOTE — Unmapped (Signed)
Extract #1: Escalating: DM, CT, DG, M  Dose: 0.25 ml  Site: right arm  Administered by: G.Keane Martelli RN    Extract #2: Escalating: TR, GR, W  Dose: 0.25 ml  Site: left arm  Administered by: G.Shenice Dolder RN    Pt noted a localized rash like reaction on her chest after getting her last injection. Noted that the dose may be reduced for the next appointment. Encouraged the patient to keep an eye out tonight if the rash appears again.

## 2021-10-21 ENCOUNTER — Institutional Professional Consult (permissible substitution)
Admit: 2021-10-21 | Discharge: 2021-10-21 | Payer: PRIVATE HEALTH INSURANCE | Attending: Clinical & Laboratory Immunology

## 2021-10-21 DIAGNOSIS — J3089 Other allergic rhinitis: Secondary | ICD-10-CM

## 2021-10-21 NOTE — Unmapped (Signed)
Patient hasn't had any reactions from their allergy injections. Doing well with their Immunotherapy. Mold and pollens avoidence guidelines were discussed. Patient was advised to continue their nasal rinsing and stay inside when mold and pollen counts were high. Patient was shone their allergy vials to verify their name and date of birth.

## 2021-10-27 ENCOUNTER — Ambulatory Visit: Admit: 2021-10-27 | Discharge: 2021-10-27 | Payer: PRIVATE HEALTH INSURANCE

## 2021-10-27 DIAGNOSIS — F988 Other specified behavioral and emotional disorders with onset usually occurring in childhood and adolescence: Secondary | ICD-10-CM

## 2021-10-27 MED ORDER — dextroamphetamine-amphetamine (ADDERALL XR) 20 MG 24 hr capsule
20 | ORAL_CAPSULE | Freq: Every morning | ORAL | 0 refills | Status: AC
Start: 2021-10-27 — End: 2022-01-28

## 2021-10-27 MED ORDER — dextroamphetamine-amphetamine (ADDERALL XR) 20 MG 24 hr capsule
20 | ORAL_CAPSULE | Freq: Every morning | ORAL | 0 refills | Status: AC
Start: 2021-10-27 — End: 2022-01-22

## 2021-10-27 MED ORDER — predniSONE (DELTASONE) 20 MG tablet
20 | ORAL_TABLET | ORAL | 0 refills | Status: AC
Start: 2021-10-27 — End: ?

## 2021-10-27 MED ORDER — montelukast (SINGULAIR) 10 mg tablet
10 | ORAL_TABLET | Freq: Every evening | ORAL | 3 refills | Status: AC
Start: 2021-10-27 — End: ?

## 2021-10-27 NOTE — Unmapped (Signed)
Subjective   HPI:   Patient ID: Mary [name] is a 51 y.o. female.    The following HPI was reviewed and copied forward (with edits) from a note written by me on 01/20/21. I have reviewed and updated the history, physical exam, data, assessment, and plan of the note as detailed below so that it reflects my evaluation and management of the patient.       The patient's chart was reviewed prior to the visit as part of pre-visit planning  No chief complaint on file.       ADD   C/w AXR 40mg  once daily, concentration is doing ok (past did better w 45mg  but $$$ issues)    Is keen to reduce dose and eventually dc, would like to see psych later   Denies any s/e   Denies diversion or st/drug abuse    Asthma:Had covid 11/28: still having symptoms    C/w symbicort, chest tight and wheezing; cough w/o sputum    L thumb arthritis: ++ pain, splint not helping    Abnormal brain CT: hasnt yet been able to see neuro, needs new referral    12/21 CT: The ventricles are mildly enlarged, proportional to sulcal enlargement likely related to diffuse atrophy. Mild scattered hypoattenuation is seen throughout the subcortical and periventricular white matter, likely related to chronic small vessel ischemic white matter disease.         Controlled Substance Monitoring 11/12/2019 02/11/2020 06/25/2020 09/30/2020 01/20/2021 04/22/2021 10/27/2021   OARRS/eKASPER Status Reviewed Reviewed Reviewed Reviewed Reviewed Reviewed Reviewed   OARRS/eKASPER Consistent with Prescriber Expectation [name]     I have completed the required OARRS/eKASPER documentation for this patient on 10/27/2021.  Tyerra Loretto [name]      Past Medical History:   Diagnosis Date   ??? ADD (attention deficit disorder)    ??? Allergy    ??? Anxiety    ??? Asthma    ??? Cancer (CMS Dx)    ??? Cervical dysplasia    ??? Chronic back pain    ??? Depression    ??? Diverticulitis    ??? Environmental allergies    ??? Iron deficiency    ??? Varicella    ??? Vitamin D deficiency        Current Outpatient Medications    Medication Sig Dispense Refill   ??? [START ON 12/26/2021] dextroamphetamine-amphetamine (ADDERALL XR) 20 MG 24 hr capsule Take 2 capsules (40 mg total) by mouth every morning for 30 days. 60 capsule 0   ??? [START ON 11/26/2021] dextroamphetamine-amphetamine (ADDERALL XR) 20 MG 24 hr capsule Take 2 capsules (40 mg total) by mouth every morning for 30 days. 60 capsule 0   ??? dextroamphetamine-amphetamine (ADDERALL XR) 20 MG 24 hr capsule Take 2 capsules (40 mg total) by mouth every morning for 30 days. 60 capsule 0   ??? montelukast (SINGULAIR) 10 mg tablet Take 1 tablet (10 mg total) by mouth at bedtime. 90 tablet 3   ??? predniSONE (DELTASONE) 20 MG tablet 1 by mouth twice daily x 3 days then 1 by mouth once daily x 4 days 10 tablet 0   ??? albuterol (PROAIR HFA) 90 mcg/actuation inhaler Inhale 2 puffs into the lungs every 6 hours as needed for Wheezing. 3 Inhaler 3   ??? albuterol (PROAIR HFA) 90 mcg/actuation Inhl inhaler Inhale 2 puffs into the lungs every 6 hours as needed for Wheezing. 3 Inhaler 3   ??? azelastine (ASTELIN) 137 mcg (0.1 %)  nasal spray Use 1 spray into each nostril 2 times a day. Use in each nostril as directed 30 mL 1   ??? budesonide (PULMICORT) 0.5 mg/2 mL nebulizer solution Add one 2mL ampule (0.5mg ) into 250 mL saline in nasal rinse bottle and irrigate both sides of your nose with entire volume twice a day. 60 ampule 6   ??? budesonide-formoteroL (SYMBICORT) 160-4.5 mcg/actuation inhaler Inhale 1 puff into the lungs 2 times a day. 10.2 g 3   ??? cetirizine (ZYRTEC) 10 MG tablet Take 10 mg by mouth daily as needed for Allergies.     ??? cholecalciferol, vitamin D3, 125 mcg (5,000 unit) Tab Take by mouth.     ??? dextroamphetamine-amphetamine (ADDERALL XR) 20 MG 24 hr capsule Take 2 capsules (40 mg total) by mouth every morning for 30 days. 27 capsule 0   ??? EPINEPHrine (AUVI-Q) 0.3 mg/0.3 mL AtIn injection Inject 0.3 mLs (0.3 mg total) into the muscle once for 1 dose. Indications: Anaphylaxis, person at risk of  anaphylaxis, patient starting allergy injections 1 each 0   ??? escitalopram oxalate (LEXAPRO) 20 MG tablet Take 1 tablet (20 mg total) by mouth daily. 90 tablet 3   ??? FERROUS SULFATE (IRON ORAL) Take by mouth.     ??? fluticasone (FLONASE) 50 mcg/actuation nasal spray SHAKE LIQUID AND USE 1 TO 2 SPRAYS IN EACH NOSTRIL DAILY AS NEEDED 16 g 0   ??? melatonin 10 mg Cap Take by mouth.       No current facility-administered medications for this visit.          ROS:   Review of Systems  as above   Objective:   Physical Exam      Wt Readings from Last 3 Encounters:   06/11/21 169 lb (76.7 kg)   01/20/21 169 lb (76.7 kg)   09/30/20 169 lb 3.2 oz (76.7 kg)       There were no vitals filed for this visit.  There is no height or weight on file to calculate BMI.  There is no height or weight on file to calculate BSA.    NAD  Psych: A&Ox3; reactive affect; no obv thought d/o       Lab Results   Component Value Date    WBC 7.9 09/30/2017    HGB 12.3 09/30/2017    HCT 37.3 09/30/2017    MCV 88.7 09/30/2017    PLT 229 09/30/2017       Lab Results   Component Value Date    HGBA1C 5.9 (H) 10/01/2020     No components found for: GLUF,  MICROALBUR,  LDLCALC,  CREATININE    Lab Results   Component Value Date    GLUCOSE 96 10/01/2020       No results found for: [facility]    Lab Results   Component Value Date    TSH 1.79 10/01/2020     Lab Results   Component Value Date    VITAMINB12 >1506 (H) 10/01/2020          Upcoming Health Maintenance     Hepatitis C Screening (MyChart) (Once)  Overdue - never done    Comprehensive Physical Exam (Yearly)  Overdue - never done    HIV Screening (Once)  Overdue - never done    Immunization: DTaP/Tdap/Td (1 - Tdap)  Overdue - never done    Immunization: Pneumococcal (2 - PCV)  Overdue since 10/04/2009    Immunization: Zoster (1 of 2)  Overdue - never done  Cervical Cancer Screening/Pap Smear (MyChart) (Every 3 Years)  Overdue since 05/13/2020    Depression Monitoring (PHQ-9) (Every 3 Months)  Overdue since  04/30/2021    Immunization: Influenza (MyChart) (1)  Overdue since 06/04/2021    Immunization: COVID-19 (3 - Booster for [employer] series)  Overdue since 07/13/2021    Mammogram (MyChart) (Every 2 Years)  Next due on 08/20/2022    Alcohol Misuse Screening (Yearly)  Next due on 10/27/2022    Colorectal Cancer Screening (MyChart) (Cologuard (FIT-DNA) - Every 3 Years)  Next due on 12/04/2022    Diabetes Screening (Every 3 Years)  Next due on 10/02/2023    Lipid Panel (Every 5 Years)  Next due on 10/01/2025         Immunization History   Administered Date(s) Administered   ??? COVID-19, mRNA, Moderna monovalent, age 53+ 04/20/2021, 05/18/2021   ??? Influenza, quadrivalent, intradermal, preservative-free 07/04/2017   ??? Influenza, quadrivalent, preservative-free 08/06/2015, 06/25/2016, 09/11/2018, 09/30/2020   ??? Influenza, unspecified 05/31/2008   ??? Pneumococcal polysaccharide, 23-valent 10/04/2008         Assessment/Plan:     TI: 140  TO: 201  This was a Video visit, including two-way audio and video communication, in lieu of an in-person visit. The patient provided verbal consent to participate in the telehealth visit.   I spent 17 minutes speaking with the patient, conducting an interview, performing a limited exam, and educating the patient on my assessment and plan. I also spent 4 minutes, on the same day as the encounter, preparing to see the patient (eg, review of tests), ordering medications, tests, or procedures, documenting clinical information in the electronic or other health record and performing non-face-to-face activities.       A/P:  Patient Instructions     Encounter Diagnoses   Name Primary?   ??? Moderate persistent asthma with acute exacerbation  - prednisone x 1 week    ??? Attention deficit disorder, unspecified hyperactivity presence  - continue with the current treatment      ??? Chronic pain of left thumb  - thumb splint; Voltaren (diclofenac) gel is an otc medicine that can really help with pain related to  inflammation. It is applied to the painful area 3-4x/day as needed.  - orthopedics consult: [facility] [place]) [phone] Yes   ??? Abnormal head CT  - Please call to schedule your appointment:  [facility]: [phone]  [facility]: [phone]               RTC: 3 months    New meds:   none  Patient education given  none  Patient verbalized understanding & agreement of the proposed plan.

## 2021-10-27 NOTE — Unmapped (Addendum)
Encounter Diagnoses   Name Primary?    Moderate persistent asthma with acute exacerbation  - prednisone x 1 week  - Do make sure to get the newer COVID bivalent booster      Attention deficit disorder, unspecified hyperactivity presence  - continue with the current treatment       Chronic pain of left thumb  - thumb splint; Voltaren (diclofenac) gel is an otc medicine that can really help with pain related to inflammation. It is applied to the painful area 3-4x/day as needed.  - orthopedics consult: [facility]) :[phone] Yes    Abnormal head CT  - Please call to schedule your appointment:  [facility]: [phone]  [facility]: [phone]

## 2021-10-28 ENCOUNTER — Ambulatory Visit: Payer: PRIVATE HEALTH INSURANCE

## 2021-11-04 ENCOUNTER — Ambulatory Visit: Payer: PRIVATE HEALTH INSURANCE

## 2021-11-11 ENCOUNTER — Institutional Professional Consult (permissible substitution)
Admit: 2021-11-11 | Discharge: 2021-11-11 | Payer: PRIVATE HEALTH INSURANCE | Attending: Clinical & Laboratory Immunology

## 2021-11-11 DIAGNOSIS — J3089 Other allergic rhinitis: Secondary | ICD-10-CM

## 2021-11-11 NOTE — Unmapped (Signed)
Patient hasn't had any reactions from their allergy injections. Doing well with their Immunotherapy. Mold and pollens avoidence guidelines were discussed. Patient was advised to continue their nasal rinsing and stay inside when mold and pollen counts were high. Patient was shone their allergy vials to verify their name and date of birth.

## 2021-11-18 ENCOUNTER — Institutional Professional Consult (permissible substitution)
Admit: 2021-11-18 | Discharge: 2021-11-18 | Payer: PRIVATE HEALTH INSURANCE | Attending: Clinical & Laboratory Immunology

## 2021-11-18 DIAGNOSIS — J3089 Other allergic rhinitis: Secondary | ICD-10-CM

## 2021-11-18 NOTE — Unmapped (Signed)
Patient hasn't had any reactions from their allergy injections. Doing well with their Immunotherapy. Mold and pollens avoidence guidelines were discussed. Patient was advised to continue their nasal rinsing and stay inside when mold and pollen counts were high. Patient was shone their allergy vials to verify their name and date of birth.

## 2021-11-25 ENCOUNTER — Institutional Professional Consult (permissible substitution)
Admit: 2021-11-25 | Discharge: 2021-11-25 | Payer: PRIVATE HEALTH INSURANCE | Attending: Clinical & Laboratory Immunology

## 2021-11-25 DIAGNOSIS — J3089 Other allergic rhinitis: Secondary | ICD-10-CM

## 2021-11-25 NOTE — Unmapped (Signed)
Patient hasn't had any reactions from their allergy injections. Doing well with their Immunotherapy. Mold and pollens avoidence guidelines were discussed. Patient was advised to continue their nasal rinsing and stay inside when mold and pollen counts were high. Patient was shone their allergy vials to verify their name and date of birth.

## 2021-12-01 MED ORDER — dextroamphetamine-amphetamine (ADDERALL) 10 mg Tab
10 | ORAL_TABLET | Freq: Two times a day (BID) | ORAL | 0 refills | Status: AC
Start: 2021-12-01 — End: 2022-01-22

## 2021-12-01 NOTE — Unmapped (Signed)
Patient called the office and says she has been having quite a bit of trouble finding a pharmacy that carries her adderal extended release.     I was able to reach out to [facility] who states they do have the GENERIC immediate release adderal - 10mg .     The patient would be ok with taking the immediate release adderal 10mg  as she has been out of her RX for 5 days and says its hard for her to get out of bed.     Please advise   [phone]

## 2021-12-01 NOTE — Unmapped (Signed)
1. Ok, Prescription(s) were electronically prescribed today  2. Just sent in 1 month rx, in 1 month if needing this again let me know  3. Follow up in 2 months for further refills

## 2021-12-02 ENCOUNTER — Ambulatory Visit: Payer: PRIVATE HEALTH INSURANCE

## 2021-12-03 ENCOUNTER — Institutional Professional Consult (permissible substitution): Admit: 2021-12-03 | Discharge: 2021-12-03 | Payer: PRIVATE HEALTH INSURANCE

## 2021-12-03 ENCOUNTER — Ambulatory Visit: Admit: 2021-12-03 | Discharge: 2021-12-03 | Payer: PRIVATE HEALTH INSURANCE | Attending: Sports Medicine

## 2021-12-03 ENCOUNTER — Inpatient Hospital Stay: Admit: 2021-12-03 | Payer: PRIVATE HEALTH INSURANCE | Attending: Sports Medicine

## 2021-12-03 ENCOUNTER — Ambulatory Visit: Payer: PRIVATE HEALTH INSURANCE

## 2021-12-03 DIAGNOSIS — M1812 Unilateral primary osteoarthritis of first carpometacarpal joint, left hand: Secondary | ICD-10-CM

## 2021-12-03 DIAGNOSIS — J3089 Other allergic rhinitis: Secondary | ICD-10-CM

## 2021-12-03 DIAGNOSIS — M79642 Pain in left hand: Secondary | ICD-10-CM

## 2021-12-03 NOTE — Unmapped (Signed)
Chief Complaint   Patient presents with   ??? New Patient Visit/ Consultation     Left thumb        HPI:      Mary [name] is a 51 y.o. female who presents for evaluation of bilateral thumb pain.      The notes and documentation from the physician extender were reviewed, verified, and edited if necessary.       Mary [name] notes that she has been experiencing bilateral thumb pain for many weeks. He left thumb is more painful than her right thumb at this time. She is having trouble with gripping motions and she is experiencing diffuse swelling about her thumb as well. She has been using diclofenac gel, which has helped alleviate some of her pain and inflammation. She is right-hand dominant. She is currently remodeling a house. 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    Past Medical History:   Diagnosis Date   ??? ADD (attention deficit disorder)    ??? Allergy    ??? Anxiety    ??? Asthma    ??? Cancer (CMS-HCC)    ??? Cervical dysplasia    ??? Chronic back pain    ??? Depression    ??? Diverticulitis    ??? Environmental allergies    ??? Iron deficiency    ??? Varicella    ??? Vitamin D deficiency        Past Surgical History:   Procedure Laterality Date   ??? ARTHROSCOPY KNEE MENISECTOMY, MENISCAL REPAIR Right 2013    right   ??? AUGMENTATION BREAST ENDOSCOPIC  2001    [place], [place]  implant replacement due to left implant rupture    ??? AUGMENTATION MAMMAPLASTY  age 55-24    [place], [place]   ??? BIOPSY BREAST NEEDLE LOCALIZATION Right 06/02/2016    Procedure: RIGHT BREAST NEEDLE LOCALIZED EXCISIONAL BIOPSY OF 2 AREAS,   ;  Surgeon: Cristal Generous, MD;  Location: [facility] OR;  Service: General;  Laterality: Right;   ??? BREAST EXCISIONAL BIOPSY      atypical cells   ??? CERVICAL CONE BIOPSY     ??? COSMETIC SURGERY     ??? ENDOMETRIAL BIOPSY      benign polyp   ??? HYSTEROSCOPY ABLATION ENDOMETRIAL N/A 06/02/2016    Procedure: HYSTEROSCOPIC RESECTION OF ENDOMETRIAL POLYP;  Surgeon: Raylene Everts;  Location: [facility] OR;  Service: Gynecology;  Laterality: N/A;   ???  uterine biopsy       x2        Family History   Problem Relation Age of Onset   ??? Uterine Cancer Mother 66        uterine vs cervical    ??? Hypertension Father    ??? Asthma Father    ??? Alcohol abuse Father    ??? Lung Cancer Father    ??? Asthma Other    ??? Allergies Other    ??? Bone Cancer Paternal Grandmother 63   ??? Melanoma Neg Hx    ??? Breast Cancer Neg Hx    ??? Ovarian cancer Neg Hx    ??? Colon Cancer Neg Hx    ??? Pancreatic Cancer Neg Hx    ??? Prostate Cancer Neg Hx        Medication  Current Outpatient Medications   Medication Sig Dispense Refill   ??? albuterol (PROAIR HFA) 90 mcg/actuation inhaler Inhale 2 puffs into the lungs every 6 hours as needed for Wheezing. 3 Inhaler 3   ??? albuterol (  PROAIR HFA) 90 mcg/actuation Inhl inhaler Inhale 2 puffs into the lungs every 6 hours as needed for Wheezing. 3 Inhaler 3   ??? azelastine (ASTELIN) 137 mcg (0.1 %) nasal spray Use 1 spray into each nostril 2 times a day. Use in each nostril as directed 30 mL 1   ??? budesonide (PULMICORT) 0.5 mg/2 mL nebulizer solution Add one 2mL ampule (0.5mg ) into 250 mL saline in nasal rinse bottle and irrigate both sides of your nose with entire volume twice a day. 60 ampule 6   ??? budesonide-formoteroL (SYMBICORT) 160-4.5 mcg/actuation inhaler Inhale 1 puff into the lungs 2 times a day. 10.2 g 3   ??? cetirizine (ZYRTEC) 10 MG tablet Take 10 mg by mouth daily as needed for Allergies.     ??? cholecalciferol, vitamin D3, 125 mcg (5,000 unit) Tab Take by mouth.     ??? dextroamphetamine-amphetamine (ADDERALL XR) 20 MG 24 hr capsule Take 2 capsules (40 mg total) by mouth every morning for 30 days. 27 capsule 0   ??? [START ON 12/26/2021] dextroamphetamine-amphetamine (ADDERALL XR) 20 MG 24 hr capsule Take 2 capsules (40 mg total) by mouth every morning for 30 days. 60 capsule 0   ??? dextroamphetamine-amphetamine (ADDERALL XR) 20 MG 24 hr capsule Take 2 capsules (40 mg total) by mouth every morning for 30 days. 60 capsule 0   ??? dextroamphetamine-amphetamine  (ADDERALL XR) 20 MG 24 hr capsule Take 2 capsules (40 mg total) by mouth every morning for 30 days. 60 capsule 0   ??? dextroamphetamine-amphetamine (ADDERALL) 10 mg Tab Take 2 tablets (20 mg total) by mouth in the morning and at bedtime for 30 days. 120 tablet 0   ??? EPINEPHrine (AUVI-Q) 0.3 mg/0.3 mL AtIn injection Inject 0.3 mLs (0.3 mg total) into the muscle once for 1 dose. Indications: Anaphylaxis, person at risk of anaphylaxis, patient starting allergy injections 1 each 0   ??? escitalopram oxalate (LEXAPRO) 20 MG tablet Take 1 tablet (20 mg total) by mouth daily. 90 tablet 3   ??? FERROUS SULFATE (IRON ORAL) Take by mouth.     ??? fluticasone (FLONASE) 50 mcg/actuation nasal spray SHAKE LIQUID AND USE 1 TO 2 SPRAYS IN EACH NOSTRIL DAILY AS NEEDED 16 g 0   ??? melatonin 10 mg Cap Take by mouth.     ??? montelukast (SINGULAIR) 10 mg tablet Take 1 tablet (10 mg total) by mouth at bedtime. 90 tablet 3   ??? predniSONE (DELTASONE) 20 MG tablet 1 by mouth twice daily x 3 days then 1 by mouth once daily x 4 days 10 tablet 0     No current facility-administered medications for this visit.       Allergies  No Known Allergies    Review of Systems:  Pertinent items are noted in HPI.  A complete 10 system ROS was reviewed and was otherwise negative for pertinent issues.    Physical Examination:    This is a pleasant female, alert, and in no acute distress.    Vitals:    12/03/21 1358   Weight: 169 lb (76.7 kg)   Height: 5' 9 (1.753 m)       Psychiatric:   The patient's mood is normal and appropriate for their visit     Left hand exam: Positive CMC grind test. Pain with pincer-type motion. Tenderness to palpation over the [facility] joint. Neurovascularly intact.    Vascular exam:   Extremities warm and well perfused, no significant edema    Respiratory exam:  Breathing easy and unlabored    Neuro exam:   No focal neuro deficits    Lymphatic:  No obvious lymphadenopathy    Skin:     Warm, dry    Radiology:     3 view X-rays of the left hand  dated 12/03/21 were reviewed independently and discussed with the patient.  The films revealed: pseudosubluxation with CMC joint arthritis.    Assessment:     1. Arthritis of carpometacarpal (CMC) joint of left thumb        2. Left hand pain  X-ray Hand Left min 3-views          Plan:    Mary [name] is a 51 y.o. female with left thumb CMC joint arthritis. We discussed the progressive nature of the diagnosis and potential treatment options. We discussed conservative management options including topical anti-inflammatory treatment and bracing. We also discussed the option of steroid injection if pain worsens. We did discuss the option of surgical referral should conservative management fail. The patient was placed in a comfort cool brace today. I recommended using her Voltaren gel tid for pain management. She will follow up with me as needed. The patient voices understanding and all of her questions were answered.     Discussed the patient's diagnosis, exam, imaging (if applicable), and treatment options:  [x]   Discussed return to activity considerations, including the need to avoid exacerbating activity, high impact, or painful activity  [x]   Discussed the use of pain medication and nonsteroidal anti-inflammatory agents including the risk of side effects which include but not limited to:  the risk of increased blood pressure, bleeding, renal, gastrointestinal, and heart involvement.     [x]  Patient to consider ibuprofen or naprosyn for 1-2 weeks with food.     [x]  The patient can also consider acetaminophen for additional pain if needed.    []  Prescription medication prescribed today (see Epic orders)   [x]  May continue on their previously prescribed medications  [x]   Ice 3-4 x/day for 15-20 min and after activity  []   Consider glucosamine supplementation - 1500 mg/day for 2-3 months    []   Weight management   []   Consider brace  [x]   Reviewed the role of a rehabilitative exercise program:   []  Patient elected for  a referral to therapy     []  Patient elected for a home based program and instruction was given in the office today  [x]   Reviewed the role of additional diagnostic and treatment options including the role of further imaging, injection therapy, and possible surgical considerations.      Follow-up: as needed with any problems, questions, concerns, or worsening symptoms.       This note was scribed by Raina Rindani, in the presence of Laverle Patter, MD, who reviewed this chart and validates that it is accurate and all medical decision making was done by them.     I, Laverle Patter, MD validate that this chart was completed under my supervision.     The scribe???s documentation has been prepared under my direction and personally reviewed by me in its entirety. I confirm that the note above accurately reflects all work, treatment, procedures, and medical decision making performed by me.

## 2021-12-03 NOTE — Unmapped (Signed)
Extract #1: Escalating: DM, CT, DG, M  Dose: 0.45 ml  Site: right arm  Administered by: G.Quentin Strebel RN    Extract #2: Escalating: TR, GR, W  Dose: 0.45 ml  Site: left arm  Administered by: G.Undine Nealis RN    Pt noted a localized rash like reaction on her chest after getting her last injection. Noted that the dose may be reduced for the next appointment. Encouraged the patient to keep an eye out tonight if the rash appears again.

## 2021-12-07 NOTE — Unmapped (Signed)
Received prior authorization request for Adderall ER 20 mg. When trying to complete request though [employer], [employer] and [employer] insurance's both rejected with no eligibility and member not found. Reviewed [facility] and patient chart for any other coverage.     Sent patient a MyChart message to make her aware and asked her to update any new insurance or insurance changes with [place] to be able to further process her PA.

## 2021-12-09 ENCOUNTER — Ambulatory Visit: Payer: PRIVATE HEALTH INSURANCE

## 2021-12-10 ENCOUNTER — Ambulatory Visit: Admit: 2021-12-10 | Discharge: 2021-12-10 | Payer: PRIVATE HEALTH INSURANCE

## 2021-12-10 ENCOUNTER — Ambulatory Visit: Payer: PRIVATE HEALTH INSURANCE

## 2021-12-10 ENCOUNTER — Institutional Professional Consult (permissible substitution)
Admit: 2021-12-10 | Discharge: 2021-12-10 | Payer: PRIVATE HEALTH INSURANCE | Attending: Clinical & Laboratory Immunology

## 2021-12-10 DIAGNOSIS — J329 Chronic sinusitis, unspecified: Secondary | ICD-10-CM

## 2021-12-10 DIAGNOSIS — J3089 Other allergic rhinitis: Secondary | ICD-10-CM

## 2021-12-10 NOTE — Unmapped (Signed)
Called [facility] pharmacy to verify insurance coverage    RX BIN: [id number]  RX PCN: [id number]  RX GROUP: [id number]  ID: [id number]

## 2021-12-10 NOTE — Unmapped (Signed)
[facility]  Department of Otolaryngology--Head and Neck Surgery  Division of Rhinology, Allergy, and Anterior Skull Base Surgery  Blair Heys, MD, PhD, [facility]  [address] [place], [place] [address]  Telephone: [phone]        Pt name: Mary [name]  MRN: [id number]      History Present Illness on 07/31/2020:  This is a 51 y.o. female who presents with chronic sinonasal symptomatology including nasal obstruction and congestion along with mucus production and decrease sense of smell.  She has a history of asthma.  She was allergy tested as a child and was positive to pretty much everything.  She is currently using fluticasone nasal spray along with Singulair.  She continues to be symptomatic despite consistent daily usage of these medications for months.    Interval history on November 13, 2020:  When I last saw her I started her on twice daily budesonide irrigations for chronic rhinosinusitis with nasal polyps.    Allergy testing performed with [place] at [facility] on October 24, 2020 was positive to multiple perennial and seasonal allergens, including on skin prick testing.    Today she reports that she continues to be symptomatic.      Interval history on June 11, 2021:  Since I last saw her she stopped using her budesonide irrigations due to logistical issues and ongoing life issues.  Her sinonasal symptoms subsequently worsened and she has recently restarted the budesonide rinses.  However, she is having quite a bit of sinonasal symptomatology.  She has also been having some malodorous parosmia.  She has been treated with several rounds of antibiotics which have not helped so far.    Interval history on December 10, 2021:  When I last saw her I gave her a 15-day prednisone taper and asked her to use twice daily budesonide rinses.  She continues to be symptomatic today.    Radiology Findings:     Sinus CT scan performed at [facility] on September 22, 2020 shows on my  read:    Mucosal thickening in the ethmoids, maxillary sinuses and OMCs.        Physical Exam:  General Appearance: well appearing, in no apparent distress  Anterior Rhinoscopy: Septum is deviated;  left.  Inferior turbinates are hypertrophied.  Other: None.     Procedure: Because of the patient???s history and symptomatology, I performed bilateral nasal endoscopy after obtaining verbal consent from the patient.  On inspection of the interior of the nasal cavity, I examined the inferior turbinates, middle turbinates, middle meatuses, superior meatuses and sphenoethmoid recesses.  Visualization of the superior meatuses and sphenoethmoid recesses was obscured by the middle turbinate and the septum.  All structures appeared normal/unremarkable except as noted below:    Edema of both middle turbinates     There is some thick mucus draining from the left middle meatus    Impression:  Chronic rhinosinusitis with nasal polyps  Environmental allergies  Asthma  Septal deviation  Inferior turbinate hypertrophy    Plan:  At this point she is a good candidate for endoscopic sinus surgery with septoplasty and inferior turbinate reductions.  We discussed the surgery.  We also discussed that a trial of low-dose macrolides might be a reasonable option as well.  She will think about all this and let us know but it sounds like she is leaning towards having sinus surgery.  We did discuss that even after the surgery, she would need appropriate  medical management for her chronic sinusitis and she acknowledges this.      Number and Complexity of Problems (Level 4)  During this encounter, I addressed 1 or more chronic illnesses with exacerbation, progression, or side effects of treatment    Risk of Complication and/or Morbidity or Mortality of Patient Management (Level  4)  The patient's management entails Moderate risk of complications and/or morbidity or mortality.          Attestation: in the case of resident participation in the care of  this patient, I attest that I have seen and examined the patient, formulated the plan and agree with the documentation.      Blair Heys, MD, PhD, FACS  Director, Division of Rhinology, Allergy, and Anterior Skull Base Surgery  Department of Otolaryngology--Head and Neck Surgery  [facility]  [place] [facility]    CC:   Cleone Slim, MD    **This note was dictated with voice-recognition software**

## 2021-12-10 NOTE — Unmapped (Signed)
Patient hasn't had any reactions from their allergy injections. Doing well with their Immunotherapy. Mold and pollens avoidence guidelines were discussed. Patient was advised to continue their nasal rinsing and stay inside when mold and pollen counts were high. Patient was shone their allergy vials to verify their name and date of birth.

## 2021-12-10 NOTE — Unmapped (Signed)
[facility]  ALLERGY TREATMENT VIAL WORKSHEET ENVIRONMENTAL  NAME: Mary [name], MARRIN DOB: [birth year] DR. SEDAGHAT DATE: 12/10/2021  EXTRACT#1 START DATE: 11/2020  DUST MITES: End Point: Vial Dilution: Volume: ml   D. Farinae 4 Concentrate 0.2   D. Pteronyssinus 4 Concentrate 0.2   ANIMALS:      Cockroach 3 Not treated ---   Cat 5 1 0.2   Dog 3 Concentrate 0.2   MOLDS      *Alternaria 4 Concentrate 0.2   Aspergillus 6 2 0.2   Helminthosporium 3 Not treated ---   Hormodendrum 3 Not treated ---   Candida Albicans 3 Not treated ---   Penicillium notatum 3 Not treated ---   Fusarium Oxysporium 5 1 0.2   Epicoccum 5 1 0.2   Pullularia 4 Concentrate 0.2   Rhizopus 3 Not treated ---   Total Volume of Antigens: 1.23ml   Total Volume of Diluent: 3.69ml   Total Volume:   5ml   EXTRACT#2 START DATE: 11/2020  TREES      Cedar 5 1 0.2   Birch 6 2 0.2   Ash 5 Not treated ---   Pecan/ Hickory 5 1 0.2   Elm 6 2 0.2   Box Elder 3 Not treated ---   Oak 5 1 0.2   Cottonwood 4 Not treated ---   GRASSES      Johnson Grass 5 Not treated ---   Marcial Pacas Grass 5 1 0.2   [place] Grass 4 Concentrate 0.2   WEEDS      English Plantain 4 Concentrate 0.2   Ragweed 6 2 0.2   Lambs quarter 3 Not treated ---   Pigweed 3 Not treated ---   Sorrel 3 Not treated ---   Wormwood 5 1 0.2   Total Volume of Antigens:    2ml   Total Volume of Diluent: 2.54ml   Total volume of Glycerin: 0.76ml   Total Volume:    5ml   Prepared by: G.July Nickson RN 12/10/2021   Charges generated on paper invoice.  Extract lot numbers [id number]    D.Farinae: (81) [id number] EXP: 07/24/2025  D.Pteronyssinus: (10) [id number] EXP: 05/29/2025    Cat: [id number] EXP: 12/28/2023  Dog Hair: [id number] EXP: 08/08/2024    Alternaria: [id number] EXP: 05/11/2024  Aspergillus: [id number] EXP: 06/10/2024  Fusarium: [id number] EXP: 12/31/2023  Epiccocum: [id number] EXP: 08/19/2023  Pullaria: [id number] EXP: 12/31/2023    Cedar: [id number] EXP: 09/05/2024  Charletta Cousin, River: [id number] EXP: 10/05/2024  Pecan/Hickory: [id number]  EXP: 10/05/2024  Elm, American: [id number] EXP: 02/11/2024  Lafe Garin Red: [id number] EXP: 10/05/2024    English Plantain: [id number] EXP: 07/01/2024  Ragweed Short/Giant: [id number] EXP: 03/16/2023  Wormwood: [id number] EXP: 08/08/2024    Juliann Mule: [id number] EXP: 04/27/2023  [place] Grass: (10) [id number] EXP: 04/11/2024    Glycerin: [id number] EXP: 10/2026

## 2021-12-14 NOTE — Unmapped (Signed)
Prior authorization for Adderall XR 20 mg was denied through [employer]. Insurance requires documentation that the pateint has tried and failed or has a contraindication to 3 of the following drugs:  ?? Ampehatmine-dextroamphetamine ER  ?? Dexmethylphenidate ER  ?? Methylphenidate ER  ?? Azstarys  ?? Jornay PM  ?? Mydayis  ?? Vyvanse        Please provide additional information or documentation if the pateint has tried any of the drugs lsited above.    Routing back to [place], [place] to update.

## 2021-12-16 ENCOUNTER — Institutional Professional Consult (permissible substitution)
Admit: 2021-12-16 | Discharge: 2021-12-16 | Payer: PRIVATE HEALTH INSURANCE | Attending: Clinical & Laboratory Immunology

## 2021-12-16 DIAGNOSIS — J3089 Other allergic rhinitis: Secondary | ICD-10-CM

## 2021-12-16 NOTE — Unmapped (Signed)
Patient hasn't had any reactions from their allergy injections. Doing well with their Immunotherapy. Mold and pollens avoidence guidelines were discussed. Patient was advised to continue their nasal rinsing and stay inside when mold and pollen counts were high. Patient was shone their allergy vials to verify their name and date of birth.

## 2021-12-17 ENCOUNTER — Ambulatory Visit: Payer: PRIVATE HEALTH INSURANCE

## 2021-12-23 ENCOUNTER — Institutional Professional Consult (permissible substitution): Admit: 2021-12-23 | Discharge: 2021-12-23 | Payer: PRIVATE HEALTH INSURANCE

## 2021-12-23 DIAGNOSIS — J3089 Other allergic rhinitis: Secondary | ICD-10-CM

## 2021-12-23 NOTE — Unmapped (Signed)
Called the pharmacy  They said that with insurance card pt has to pay $370 but if pt uses discount card, it will only be $55.10. sent pt a mychart message to inform her

## 2021-12-23 NOTE — Unmapped (Signed)
Extract #1: Escalating: DM, CT, DG, M  Dose: 0.03 ml  Site: right arm  Administered by: G.Addelyn Alleman RN    Extract #2: Escalating: TR, GR, W  Dose: 0.03 ml  Site: left arm  Administered by: G.Shawnia Vizcarrondo RN    No noted reactions from the previous injection. The patient denies any fever or any other symptoms from the last 24 hours.

## 2021-12-24 MED ORDER — lidocaine (PF) 2% (20 mg/mL) Soln 20 mg
20 | Freq: Once | INTRAMUSCULAR | Status: AC | PRN
Start: 2021-12-24 — End: 2022-02-03

## 2021-12-29 ENCOUNTER — Inpatient Hospital Stay: Admit: 2021-12-29 | Payer: PRIVATE HEALTH INSURANCE

## 2021-12-29 DIAGNOSIS — M47812 Spondylosis without myelopathy or radiculopathy, cervical region: Secondary | ICD-10-CM

## 2021-12-30 ENCOUNTER — Institutional Professional Consult (permissible substitution)
Admit: 2021-12-30 | Discharge: 2021-12-30 | Payer: PRIVATE HEALTH INSURANCE | Attending: Clinical & Laboratory Immunology

## 2021-12-30 DIAGNOSIS — J3089 Other allergic rhinitis: Secondary | ICD-10-CM

## 2021-12-30 NOTE — Unmapped (Signed)
Patient hasn't had any reactions from their allergy injections. Doing well with their Immunotherapy. Mold and pollens avoidence guidelines were discussed. Patient was advised to continue their nasal rinsing and stay inside when mold and pollen counts were high. Patient was shone their allergy vials to verify their name and date of birth.

## 2022-01-06 ENCOUNTER — Institutional Professional Consult (permissible substitution): Admit: 2022-01-06 | Discharge: 2022-01-06 | Payer: PRIVATE HEALTH INSURANCE

## 2022-01-06 DIAGNOSIS — J3089 Other allergic rhinitis: Secondary | ICD-10-CM

## 2022-01-06 NOTE — Unmapped (Signed)
Extract #1: Escalating: DM, CT, DG, M  Dose: 0.1 ml  Site: right arm  Administered by: G.Darlinda Bellows RN    Extract #2: Escalating: TR, GR, W  Dose: 0.1 ml  Site: left arm  Administered by: G.Aditri Louischarles RN    No noted reactions from the previous injection. The patient denies any fever or any other symptoms from the last 24 hours.

## 2022-01-13 ENCOUNTER — Ambulatory Visit: Payer: PRIVATE HEALTH INSURANCE

## 2022-01-20 ENCOUNTER — Ambulatory Visit: Payer: PRIVATE HEALTH INSURANCE

## 2022-01-22 ENCOUNTER — Ambulatory Visit: Admit: 2022-01-22 | Payer: PRIVATE HEALTH INSURANCE

## 2022-01-22 ENCOUNTER — Ambulatory Visit: Admit: 2022-01-22 | Payer: PRIVATE HEALTH INSURANCE | Attending: Family

## 2022-01-22 DIAGNOSIS — J328 Other chronic sinusitis: Secondary | ICD-10-CM

## 2022-01-22 LAB — BASIC METABOLIC PANEL
Anion Gap: 10 mmol/L (ref 3–16)
BUN: 11 mg/dL (ref 7–25)
CO2: 25 mmol/L (ref 21–33)
Calcium: 9.4 mg/dL (ref 8.6–10.3)
Chloride: 101 mmol/L (ref 98–110)
Creatinine: 0.65 mg/dL (ref 0.60–1.30)
EGFR: >90
Glucose: 86 mg/dL (ref 70–100)
Osmolality, Calculated: 281 mOsm/kg (ref 278–305)
Potassium: 4.3 mmol/L (ref 3.5–5.3)
Sodium: 136 mmol/L (ref 133–146)

## 2022-01-22 LAB — CBC
Hematocrit: 39.6 % (ref 35.0–45.0)
Hemoglobin: 13.2 g/dL (ref 11.7–15.5)
MCH: 29.7 pg (ref 27.0–33.0)
MCHC: 33.4 g/dL (ref 32.0–36.0)
MCV: 88.8 fL (ref 80.0–100.0)
MPV: 7.9 fL (ref 7.5–11.5)
Platelets: 262 10*3/uL (ref 140–400)
RBC: 4.46 10*6/uL (ref 3.80–5.10)
RDW: 12.7 % (ref 11.0–15.0)
WBC: 4.5 10*3/uL (ref 3.8–10.8)

## 2022-01-22 NOTE — Unmapped (Signed)
[facility]  Department of Otolaryngology--Head and Neck Surgery  Division of Rhinology, Allergy, and Anterior Skull Base Surgery  Blair Heys, MD, PhD, [facility]  [address] [place], [place] [address]  Telephone: [phone]        Pt name: Mary [name]  MRN: [id number]      History Present Illness on 07/31/2020:  This is a 51 y.o. female who presents with chronic sinonasal symptomatology including nasal obstruction and congestion along with mucus production and decrease sense of smell.  She has a history of asthma.  She was allergy tested as a child and was positive to pretty much everything.  She is currently using fluticasone nasal spray along with Singulair.  She continues to be symptomatic despite consistent daily usage of these medications for months.    Interval history on November 13, 2020:  When I last saw her I started her on twice daily budesonide irrigations for chronic rhinosinusitis with nasal polyps.    Allergy testing performed with [place] at [facility] on October 24, 2020 was positive to multiple perennial and seasonal allergens, including on skin prick testing.    Today she reports that she continues to be symptomatic.      Interval history on June 11, 2021:  Since I last saw her she stopped using her budesonide irrigations due to logistical issues and ongoing life issues.  Her sinonasal symptoms subsequently worsened and she has recently restarted the budesonide rinses.  However, she is having quite a bit of sinonasal symptomatology.  She has also been having some malodorous parosmia.  She has been treated with several rounds of antibiotics which have not helped so far.    Interval history on December 10, 2021:  When I last saw her I gave her a 15-day prednisone taper and asked her to use twice daily budesonide rinses.  She continues to be symptomatic today.      Interval history on January 22, 2022:  She continues to be symptomatic.      Radiology Findings:      Sinus CT scan performed at [facility] on September 22, 2020 shows on my read:    Mucosal thickening in the ethmoids, maxillary sinuses and OMCs.        Physical Exam:  General Appearance: well appearing, in no apparent distress  Anterior Rhinoscopy: Septum is deviated;  left.  Inferior turbinates are hypertrophied.  Other: None.     Procedure: Because of the patient???s history and symptomatology, I performed bilateral nasal endoscopy after obtaining verbal consent from the patient.  On inspection of the interior of the nasal cavity, I examined the inferior turbinates, middle turbinates, middle meatuses, superior meatuses and sphenoethmoid recesses.  Visualization of the superior meatuses and sphenoethmoid recesses was obscured by the middle turbinate and the septum.  All structures appeared normal/unremarkable except as noted below:    Edema of both middle turbinates     There is some thick mucus draining from the left middle meatus    Impression:  Chronic rhinosinusitis with nasal polyps  Environmental allergies  Asthma  Septal deviation  Inferior turbinate hypertrophy    Plan:  Endoscopic sinus surgery with septoplasty and inferior turbinate reductions.    We discussed that after the surgery, once she is healed we will restart her on budesonide rinses.    We discussed the details of the surgery as well as the expected recovery and outcomes, generally and in a patient-specific manner.  We discussed  realistic expectations of surgery and the fact that surgery is not curative.  We discussed the risks, including bleeding, infection, recurrent/residual disease, septal perforation, re-deviation of the septum, need for further surgery, scarring, anosmia, numbness, injury to the orbit or vision, blindness, double vision, tearing, injury to the brain, CSF leak, meningitis.  She understands all of these risks and wishes to proceed.  The consents were signed.      Number and Complexity of Problems (Level 4)  During this encounter,  I addressed 1 or more chronic illnesses with exacerbation, progression, or side effects of treatment    Risk of Complication and/or Morbidity or Mortality of Patient Management (Level  4)  The patient's management entails Moderate risk of complications and/or morbidity or mortality.          Attestation: in the case of resident participation in the care of this patient, I attest that I have seen and examined the patient, formulated the plan and agree with the documentation.      Blair HeysAhmad R. Shontez Sermon, MD, PhD, FACS  Director, Division of Rhinology, Allergy, and Anterior Skull Base Surgery  Department of Otolaryngology--Head and Neck Surgery  [facility]    CC:   Cleone SlimManoj K Singh, MD    **This note was dictated with voice-recognition software**

## 2022-01-22 NOTE — Unmapped (Signed)
PRE-OPERATIVE HISTORY AND PHYSICAL      [facility] NP / PA: Leonie Man, NP    Date of Surgery: 02/03/2022   Surgeon:  Dr. Neldon Mc       Diagnosis:  Nasal septal deviation, hypertrophy of inferior nasal turbinate, chronic rhinosinusitis   Procedure:  Endoscopic sinus surgery with septoplasty and inferior turbinate reductions     Patient ID: Mary [name] is a 51 y.o. female.    Chief Complaint   Patient presents with   ??? Pre-op Exam     History of Present Illness:    51 yo female with chronic sinonasal symptomatology including nasal obstruction and congestion.   Scheduled for endoscopic sinus surgery with septoplasty and inferior turbinate reductions    Medical History:     Past Medical History:   Diagnosis Date   ??? ADD (attention deficit disorder)    ??? Allergy    ??? Anemia    ??? Anxiety    ??? Asthma    ??? Cancer (CMS-HCC)    ??? Cervical dysplasia    ??? Chronic back pain    ??? Depression    ??? Diverticulitis    ??? Environmental allergies    ??? Iron deficiency    ??? Varicella    ??? Vitamin D deficiency        Surgical History:     Past Surgical History:   Procedure Laterality Date   ??? ARTHROSCOPY KNEE MENISECTOMY, MENISCAL REPAIR Right 2013    right   ??? AUGMENTATION BREAST ENDOSCOPIC  2001    [place], [place]  implant replacement due to left implant rupture    ??? AUGMENTATION MAMMAPLASTY  age 64-24    [place], [place]   ??? BIOPSY BREAST NEEDLE LOCALIZATION Right 06/02/2016    Procedure: RIGHT BREAST NEEDLE LOCALIZED EXCISIONAL BIOPSY OF 2 AREAS,   ;  Surgeon: Cristal Generous, MD;  Location: [facility] OR;  Service: General;  Laterality: Right;   ??? BREAST EXCISIONAL BIOPSY      atypical cells   ??? CERVICAL CONE BIOPSY     ??? COSMETIC SURGERY     ??? ENDOMETRIAL BIOPSY      benign polyp   ??? HYSTEROSCOPY ABLATION ENDOMETRIAL N/A 06/02/2016    Procedure: HYSTEROSCOPIC RESECTION OF ENDOMETRIAL POLYP;  Surgeon: Raylene Everts;  Location: [facility] OR;  Service: Gynecology;  Laterality: N/A;   ??? uterine biopsy       x2       Family History:     Family History    Problem Relation Age of Onset   ??? Uterine Cancer Mother 33        uterine vs cervical    ??? Hypertension Father    ??? Asthma Father    ??? Alcohol abuse Father    ??? Lung Cancer Father    ??? Bone Cancer Paternal Grandmother 41   ??? Asthma Other    ??? Allergies Other    ??? Melanoma Neg Hx    ??? Breast Cancer Neg Hx    ??? Ovarian cancer Neg Hx    ??? Colon Cancer Neg Hx    ??? Pancreatic Cancer Neg Hx    ??? Prostate Cancer Neg Hx    ??? Anesthesia problems Neg Hx        Allergies:   No Known Allergies    Medications:     Prior to Admission medications taking for visit date 01/22/22   Medication Sig Taking? Authorizing Provider   albuterol [facility] HFA) 90 mcg/actuation  inhaler Inhale 2 puffs into the lungs every 6 hours as needed for Wheezing. Yes Melissa Montane, MD   azelastine (ASTELIN) 137 mcg (0.1 %) nasal spray Use 1 spray into each nostril 2 times a day. Use in each nostril as directed Yes Vallery Ridge, MD   budesonide-formoteroL [facility]) 160-4.5 mcg/actuation inhaler Inhale 1 puff into the lungs 2 times a day. Yes Melissa Montane, MD   cetirizine (ZYRTEC) 10 MG tablet Take 1 tablet (10 mg total) by mouth daily as needed for Allergies. Yes Historical Provider, MD   cholecalciferol, vitamin D3, 125 mcg (5,000 unit) Tab Take by mouth. Yes Historical Provider, MD   dextroamphetamine-amphetamine (ADDERALL XR) 20 MG 24 hr capsule Take 2 capsules (40 mg total) by mouth every morning for 30 days. Yes Melissa Montane, MD   EPINEPHrine (AUVI-Q) 0.3 mg/0.3 mL AtIn injection Inject 0.3 mLs (0.3 mg total) into the muscle once for 1 dose. Indications: Anaphylaxis, person at risk of anaphylaxis, patient starting allergy injections Yes [name], MD   escitalopram oxalate (LEXAPRO) 20 MG tablet Take 1 tablet (20 mg total) by mouth daily. Yes Melissa Montane, MD   FERROUS SULFATE (IRON ORAL) Take by mouth. Yes Historical Provider, MD   fluticasone (FLONASE) 50 mcg/actuation nasal spray SHAKE LIQUID AND USE 1 TO 2 SPRAYS IN EACH NOSTRIL DAILY AS  NEEDED Yes Runell Gess Chikwa, CNP   montelukast (SINGULAIR) 10 mg tablet Take 1 tablet (10 mg total) by mouth at bedtime. Yes Melissa Montane, MD   predniSONE (DELTASONE) 20 MG tablet 1 by mouth twice daily x 3 days then 1 by mouth once daily x 4 days  Patient taking differently: 1 by mouth twice daily x 3 days then 1 by mouth once daily x 4 days  As needed  Melissa Montane, MD   albuterol (PROAIR HFA) 90 mcg/actuation Inhl inhaler Inhale 2 puffs into the lungs every 6 hours as needed for Wheezing.  Manoj Thedore Mins, MD   budesonide (PULMICORT) 0.5 mg/2 mL nebulizer solution Add one 2mL ampule (0.5mg ) into 250 mL saline in nasal rinse bottle and irrigate both sides of your nose with entire volume twice a day.  [name], MD   dextroamphetamine-amphetamine (ADDERALL XR) 20 MG 24 hr capsule Take 2 capsules (40 mg total) by mouth every morning for 30 days.  Melissa Montane, MD   dextroamphetamine-amphetamine (ADDERALL XR) 20 MG 24 hr capsule Take 2 capsules (40 mg total) by mouth every morning for 30 days.  Melissa Montane, MD   dextroamphetamine-amphetamine (ADDERALL XR) 20 MG 24 hr capsule Take 2 capsules (40 mg total) by mouth every morning for 30 days.  Melissa Montane, MD   dextroamphetamine-amphetamine (ADDERALL) 10 mg Tab Take 2 tablets (20 mg total) by mouth in the morning and at bedtime for 30 days.  Melissa Montane, MD   melatonin 10 mg Cap Take by mouth.  Historical Provider, MD        Review of Systems   Constitutional: Positive for fatigue (chronic ). Negative for activity change and appetite change.   HENT: Positive for congestion, rhinorrhea and sinus pressure. Negative for trouble swallowing.    Respiratory: Positive for cough and wheezing. Negative for shortness of breath.         Symptoms stable    Cardiovascular: Negative for chest pain, palpitations and leg swelling.        Denies orthopnea    Gastrointestinal: Negative for heartburn.   Genitourinary: Negative for dysuria and hematuria.   Musculoskeletal: Positive  for  neck pain and neck stiffness. Negative for back pain and gait problem.   Skin: Negative for rash and wound.   Neurological: Positive for numbness (bilateral hands ) and headaches. Negative for dizziness, tremors, syncope and weakness.   Hematological: Bruises/bleeds easily (bruises easily - denies excessive bleeding ).   Psychiatric/Behavioral: Positive for sleep disturbance.       Objective:   Blood pressure 141/89, pulse 103, temperature 98.6 ??F (37 ??C), temperature source Temporal, resp. rate 16, height 5' 9 (1.753 m), weight 165 lb (74.8 kg), SpO2 100 %.    Physical Exam  Vitals reviewed.   HENT:      Head: Normocephalic.      Right Ear: External ear normal.      Left Ear: External ear normal.      Nose: Nose normal.      Mouth/Throat:      Pharynx: Oropharynx is clear.   Eyes:      General:         Right eye: No discharge.         Left eye: No discharge.   Cardiovascular:      Rate and Rhythm: Normal rate and regular rhythm.      Heart sounds: Normal heart sounds.   Pulmonary:      Breath sounds: Normal breath sounds.   Abdominal:      Palpations: Abdomen is soft.   Musculoskeletal:      Cervical back: Normal range of motion.      Right lower leg: No edema.      Left lower leg: No edema.   Skin:     General: Skin is warm and dry.   Neurological:      Mental Status: She is alert and oriented to person, place, and time.   Psychiatric:         Mood and Affect: Mood normal.         Thought Content: Thought content normal.         Lab Review:   BMP:       Invalid input(s): AGAP, GLU, CREAT, GFRNA  CBC:     COAGS:     RENAL:    HEP:     HGBA1C:         Study Results:   PFTs 07/29/2015  Obstructive defect is present.  Spirometry shows moderately severe obstructive defect.  There is a significant response to bronchodilator demonstrated.  Air trapping is present.  Diffusing capacity is elevated and may suggest asthma.  FEV1 liters 1.73    FEV1%  56    FVC liters 3.56    FVC%  92    FEV1/FVC % 48.69    FEV1/FVC EXP   81.03    RESPONSE TO BRONCHODILATOR  +10% FVC, +29% FEV1    TLC liters 6.15    TLC%  107    RV  2.80    RV%  149    VC  3.35    VC%  87    DLCO ml/mmHg sec 26.20    DLCO%  124        CT maxillofacial 12/30/2021   1. ??No significant paranasal sinus disease.   2. 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   3. ??Multilevel cervical facet arthrosis with degenerative subluxations discussed above.   4. 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 If potentially of clinical relevance, MRI IAC protocol without and  with contrast would be helpful for further characterization. Stability since 2021 would make an aggressive or malignant process unlikely.    Carotid duplex 11/18/2020  Bilateral: Bilateral extracranial carotid duplex is normal. There is minimal evidence of atherosclerotic changes and normal Doppler velocity waveforms were obtained at all sample sites.The mid vertebral artery appears patent with normal antegrade flow.   Conclusions: Normal bilateral carotid artery duplex exam. ??Normal antegrade vertebral artery flow, bilateral.    Anesthesia Considerations:   ASA Physical Status:  2    Duke Activity Scale:  4 - Raking leaves; weeding or pushing a power mower.    Airway:  Mallampati I (soft palate, uvula, fauces, and tonsillar pillars visible), Thyromental distance 3 finger breadths, opening 3 finger breadths  IV Access: adequate targets   Anesthesia Complications: none      Assessment and Recommendations:   51 yo female scheduled for endoscopic sinus surgery with septoplasty and inferior turbinate reductions under [facility]    1. Cardiac risk: No history of CAD/MI or CHF   Denies history of cardiac testing  Denies chest pain  Denies SOB, orthopnea, edema   Duke Activity 4     2. Asthma:   PFTs 07/2015 FEV1 1.73 liters, 56% predicted, obstructive defect is present -moderately severe defect, significant response to  bronchodilator suggestive of asthma   Using multiple inhalers  Pulmicort nebulizer as needed  Taking montelukast   Takes prednisone as needed for exacerbation   SpO2 100% on room air   Well controlled  Denies recent exacerbation   Breath sounds clear on exam today     3. Anemia:  no recent H&H  Taking ferrous sulfate  Cbc obtained today  No history of transfusion     4. Depression:   Taking lexapro  Advised to continue perioperatively.     5. ADD/ADHD:   Taking adderall    Pt is functional without chest pain or SOB. No indication for additional testing prior to planned surgery.     Airway:  Mallampati I (soft palate, uvula, fauces, and tonsillar pillars visible), Thyromental distance 3 finger breadths, opening 3 finger breadths  IV Access: adequate targets   Anesthesia Complications: none    Number and Complexity of Problems Addressed  1 acute, uncomplicated illness or injury    Amount and/or Complexity of Data to be Reviewed and Analyzed  3+ review of the results of each unique test  2 unique tests ordered    Risk of Complications and/or Morbidity or Mortality of Patient Management  Minimal    Time  I spent a total of 64 minutes on the day of the visit.       Preoperative instructions reviewed with patient. Patient verbalizes understanding.  Labs obtained today: cbc, bmp     Claretha Cooper, CNP

## 2022-01-26 NOTE — Unmapped (Signed)
Today I spoke with Dr. Orpah MelterGenta [facility] the [facility]'s [facility], related to her upcoming sinus surgery (case: [id number]) which is [facility].  Images of the patient's sinus CT scan have been requested (and [facility] for disease in all of the paranasal sinuses, which I have proposed opening.  As I was told that [facility] to [facility] surgery for only sinuses with radiographic disease in them--which is a policy that I believe to be not evidence-based and ignores multiple factors that are included in medical decision making related to the extent of sinus surgery (which I also communicated to Dr. Orpah MelterGenta)-- this note is to document my medical reasoning as further advocacy for the patient.      Mary [name] has had prior endoscopic findings of nasal polyps and to date continues to have thick mucus draining from the middle meatuses and evidence of edema of the middle turbinates or middle meatuses, all of which supports the diagnosis of chronic rhinosinusitis.  She has had these findings on high dose topical steroids, on which she remains quite symptomatic.    As it relates to the extent of sinus surgery, I believe the patient's endoscopic findings, including polyps and mucoid middle meatal drainage, to be indicative of a diffuse inflammatory process.  While she may have responded objectively to high dose topical steroids (which she continues to be on), she is clearly still symptomatic.  For patients with polyps, who frequently have diffuse sinus disease--whether suppressed radiographically by current medical management, random fluctuation in mucosal edema or not--and are currently symptomatic, my reasoning for consideration of opening all of the sinuses is related to several points.  First, there is a real possibility of future recurrence of polyp disease.  The risk imparted to the patient in future revision sinus surgery, if needed, is  increased due to osteitic changes that occur after primary sinus surgery.  Moreover, I have seen many cases like Mary [name]'s, in which the endoscopic appearance of her paranasal sinus mucosa appears much more inflamed at the time of surgery compared to what would have been expected from her sinus CT.  I suspect this to be the case with Mary [name] given her endoscopic findings.  Finally, we also know that polyp/polypoid (type-2) sinus disease responds well to topical steroids.  For all of these reasons, my plan for her sinus surgery was to approach it with consideration for opening all sinuses.  We will await the decision from Mary [name]'s insurer prior to performance of her surgery.       Mary HeysAhmad R. Aaralyn Kil, MD, PhD

## 2022-01-27 ENCOUNTER — Ambulatory Visit: Payer: PRIVATE HEALTH INSURANCE

## 2022-01-28 ENCOUNTER — Ambulatory Visit: Admit: 2022-01-28 | Discharge: 2022-01-28 | Payer: PRIVATE HEALTH INSURANCE

## 2022-01-28 DIAGNOSIS — M159 Polyosteoarthritis, unspecified: Secondary | ICD-10-CM

## 2022-01-28 MED ORDER — dextroamphetamine-amphetamine (ADDERALL XR) 20 MG 24 hr capsule
20 | ORAL_CAPSULE | Freq: Every morning | ORAL | 0 refills | Status: AC
Start: 2022-01-28 — End: 2022-03-29

## 2022-01-28 MED ORDER — dextroamphetamine-amphetamine (ADDERALL XR) 20 MG 24 hr capsule
20 | ORAL_CAPSULE | Freq: Every morning | ORAL | 0 refills | Status: AC
Start: 2022-01-28 — End: 2022-02-27

## 2022-01-28 MED ORDER — dextroamphetamine-amphetamine (ADDERALL) 20 mg Tab
20 | ORAL_TABLET | Freq: Two times a day (BID) | ORAL | 0 refills | Status: AC
Start: 2022-01-28 — End: 2022-02-27

## 2022-01-28 MED ORDER — dextroamphetamine-amphetamine (ADDERALL XR) 20 MG 24 hr capsule
20 | ORAL_CAPSULE | Freq: Every morning | ORAL | 0 refills | Status: AC
Start: 2022-01-28 — End: 2022-04-28

## 2022-01-28 NOTE — Unmapped (Addendum)
Encounter Diagnoses   Name Primary?    Osteoarthritis of multiple joints, unspecified osteoarthritis type  - consider physical therapy: ongoing home exercises  - rheumatology consult: [facility],  [facility]/ or [place] [facility], [phone]  Dr Charlcie Cradle, [place]: [phone]   Dr Nira Conn, [place]: [phone]   Dr Gillermina Hu, [place]: [phone]   Yes    Attention deficit disorder, unspecified hyperactivity presence  - stable: continue with the current treatment

## 2022-01-28 NOTE — Unmapped (Signed)
Subjective   HPI:   Patient ID: Mary [name] is a 51 y.o. female.    The following HPI was reviewed and copied forward (with edits) from a note written by me on 10/27/21. I have reviewed and updated the history, physical exam, data, assessment, and plan of the note as detailed below so that it reflects my evaluation and management of the patient.       The patient's chart was reviewed prior to the visit as part of pre-visit planning  No chief complaint on file.       ADD   C/w AXR 40mg  once daily, concentration is doing ok not as helpful as it used to be (past did better w 45mg  but $$$ issues)    Is keen to reduce dose and eventually dc   Denies any s/e   Denies diversion or st/drug abuse x see below re medical mj    Stressors: scheduled for DNS scheduled this week; enlarged cerebral ventricles: has had difficulty getting scheduled with neuro; pain w OA ++: recent head CT showed facet arthritis: ongoing DC tx, wonders if issues could be a/i, for example also has ++ hair loss   Had cs for medical marijuana to help manage pain (& also anxiety): rec'ed her CTR and is looking into this          02/11/2020     5:22 PM 06/25/2020     2:20 PM 09/30/2020     1:12 PM 01/20/2021     2:32 PM 04/22/2021     2:46 PM 10/27/2021     1:42 PM 01/28/2022     4:21 PM   Controlled Substance Monitoring   OARRS/eKASPER Status Reviewed Reviewed Reviewed Reviewed Reviewed Reviewed Reviewed   OARRS/eKASPER Consistent with Prescriber Expectation [name]     I have completed the required OARRS/eKASPER documentation for this patient on 01/28/2022.  Armine Rizzolo [name]      Past Medical History:   Diagnosis Date   ??? ADD (attention deficit disorder)    ??? Allergy    ??? Anemia    ??? Anxiety    ??? Asthma    ??? Cancer (CMS-HCC)    ??? Cervical dysplasia    ??? Chronic back pain    ??? Depression    ??? Diverticulitis    ??? Environmental allergies    ??? Iron deficiency    ??? Varicella    ??? Vitamin D deficiency        Current Outpatient Medications   Medication Sig  Dispense Refill   ??? dextroamphetamine-amphetamine (ADDERALL XR) 20 MG 24 hr capsule Take 2 capsules (40 mg total) by mouth every morning for 30 days. 60 capsule 0   ??? albuterol (PROAIR HFA) 90 mcg/actuation inhaler Inhale 2 puffs into the lungs every 6 hours as needed for Wheezing. 3 Inhaler 3   ??? azelastine (ASTELIN) 137 mcg (0.1 %) nasal spray Use 1 spray into each nostril 2 times a day. Use in each nostril as directed 30 mL 1   ??? budesonide-formoteroL (SYMBICORT) 160-4.5 mcg/actuation inhaler Inhale 1 puff into the lungs 2 times a day. 10.2 g 3   ??? cetirizine (ZYRTEC) 10 MG tablet Take 1 tablet (10 mg total) by mouth daily as needed for Allergies.     ??? cholecalciferol, vitamin D3, 125 mcg (5,000 unit) Tab Take by mouth.     ??? [START ON 03/29/2022] dextroamphetamine-amphetamine (ADDERALL XR) 20 MG 24 hr capsule Take 2 capsules (  40 mg total) by mouth every morning for 30 days. 60 capsule 0   ??? [START ON 02/27/2022] dextroamphetamine-amphetamine (ADDERALL XR) 20 MG 24 hr capsule Take 2 capsules (40 mg total) by mouth every morning for 30 days. 60 capsule 0   ??? escitalopram oxalate (LEXAPRO) 20 MG tablet Take 1 tablet (20 mg total) by mouth daily. 90 tablet 3   ??? FERROUS SULFATE (IRON ORAL) Take by mouth.     ??? fluticasone (FLONASE) 50 mcg/actuation nasal spray SHAKE LIQUID AND USE 1 TO 2 SPRAYS IN EACH NOSTRIL DAILY AS NEEDED 16 g 0   ??? montelukast (SINGULAIR) 10 mg tablet Take 1 tablet (10 mg total) by mouth at bedtime. 90 tablet 3   ??? predniSONE (DELTASONE) 20 MG tablet 1 by mouth twice daily x 3 days then 1 by mouth once daily x 4 days (Patient taking differently: 1 by mouth twice daily x 3 days then 1 by mouth once daily x 4 days  As needed) 10 tablet 0     Current Facility-Administered Medications   Medication Dose Route Frequency Provider Last Rate Last Admin   ??? [START ON 02/03/2022] lidocaine (PF) 2% (20 mg/mL) Soln 20 mg  1 mL Intradermal Once PRN [name], MD              ROS:   Review of Systems  as  above   Objective:   Physical Exam      Wt Readings from Last 3 Encounters:   01/22/22 165 lb (74.8 kg)   01/22/22 165 lb (74.8 kg)   12/10/21 169 lb (76.7 kg)       There were no vitals filed for this visit.  There is no height or weight on file to calculate BMI.  There is no height or weight on file to calculate BSA.    NAD  Psych: A&Ox3; reactive affect; no obv thought d/o       Lab Results   Component Value Date    WBC 4.5 01/22/2022    HGB 13.2 01/22/2022    HCT 39.6 01/22/2022    MCV 88.8 01/22/2022    PLT 262 01/22/2022       Lab Results   Component Value Date    HGBA1C 5.9 (H) 10/01/2020     No components found for: GLUF,  MICROALBUR,  LDLCALC,  CREATININE    Lab Results   Component Value Date    GLUCOSE 86 01/22/2022       No results found for: [facility]    Lab Results   Component Value Date    TSH 1.79 10/01/2020     Lab Results   Component Value Date    VITAMINB12 >1506 (H) 10/01/2020          Upcoming Health Maintenance     Hepatitis C Screening (MyChart) (Once)  Ordered on 10/27/2021    Comprehensive Physical Exam (Yearly)  Overdue - never done    HIV Screening (Once)  Ordered on 10/27/2021    Immunization: DTaP/Tdap/Td (1 - Tdap)  Overdue - never done    Immunization: Pneumococcal (2 - PCV)  Overdue since 10/04/2009    Immunization: Zoster (1 of 2)  Overdue - never done    Cervical Cancer Screening/Pap Smear (MyChart) (Every 3 Years)  Overdue since 05/13/2020    Depression Monitoring (PHQ-9) (Every 3 Months)  Overdue since 04/30/2021    Immunization: COVID-19 (3 - Booster for [employer] series)  Overdue since 07/13/2021    Immunization: Influenza (  MyChart) (Season Ended)  Next due on 06/04/2022    Mammogram (MyChart) (Every 2 Years)  Next due on 08/20/2022    Alcohol Misuse Screening (Yearly)  Next due on 10/27/2022    Colorectal Cancer Screening (MyChart) (Cologuard (FIT-DNA) - Every 3 Years)  Next due on 12/04/2022    Diabetes Screening (Every 3 Years)  Ordered on 10/27/2021    Lipid Panel (Every 5 Years)  Ordered  on 10/27/2021         Immunization History   Administered Date(s) Administered   ??? COVID-19, mRNA, Moderna monovalent, age 46+ 04/20/2021, 05/18/2021   ??? Influenza, quadrivalent, intradermal, preservative-free 07/04/2017   ??? Influenza, quadrivalent, preservative-free 08/06/2015, 06/25/2016, 09/11/2018, 09/30/2020   ??? Influenza, unspecified 05/31/2008   ??? Pneumococcal polysaccharide, 23-valent 10/04/2008         Assessment/Plan:     This was a Video visit, including two-way audio and video communication, in lieu of an in-person visit. The patient provided verbal consent to participate in the telehealth visit.   I spent 15 minutes speaking with the patient, conducting an interview, performing a limited exam, and educating the patient on my assessment and plan. I also spent 5 minutes, on the same day as the encounter, preparing to see the patient (eg, review of tests), ordering medications, tests, or procedures, documenting clinical information in the electronic or other health record and performing non-face-to-face activities.   [phone]    A/P:  Patient Instructions     Encounter Diagnoses   Name Primary?   ??? Osteoarthritis of multiple joints, unspecified osteoarthritis type  - consider physical therapy: ongoing home exercises  - rheumatology consult: [facility],  [facility]/ or [place] [facility], [phone]  Dr Charlcie Cradle, [place]: [phone]   Dr Nira Conn, [place]: [phone]   Dr Gillermina Hu, [place]: [phone]   Yes   ??? Attention deficit disorder, unspecified hyperactivity presence  - stable: continue with the current treatment               RTC: 3 months    New meds:   none  Patient education given  none  Patient verbalized understanding & agreement of the proposed plan.

## 2022-02-02 ENCOUNTER — Ambulatory Visit: Payer: PRIVATE HEALTH INSURANCE

## 2022-02-02 NOTE — Unmapped (Signed)
I left a VM that her new arrival time for surgery on 02/03/2022 at [facility] is 0900. I also sent this time change to her [facility] MY Chart.

## 2022-02-03 MED ORDER — neostigmine methylsulfate (PROSTIGMIN) IV solution
1 | INTRAVENOUS | Status: AC | PRN
Start: 2022-02-03 — End: 2022-02-03
  Administered 2022-02-03: 16:00:00 4 via INTRAVENOUS

## 2022-02-03 MED ORDER — midazolam (PF) (VERSED) 1 mg/mL injection
1 | INTRAMUSCULAR | Status: AC
Start: 2022-02-03 — End: ?

## 2022-02-03 MED ORDER — fentaNYL (SUBLIMAZE) injection 12.5 mcg
50 | INTRAMUSCULAR | Status: AC | PRN
Start: 2022-02-03 — End: 2022-02-03

## 2022-02-03 MED ORDER — dexamethasone (DECADRON) injection
4 | INTRAMUSCULAR | Status: AC | PRN
Start: 2022-02-03 — End: 2022-02-03
  Administered 2022-02-03: 15:00:00 12 via INTRAVENOUS

## 2022-02-03 MED ORDER — midazolam (PF) (VERSED) injection
1 | INTRAMUSCULAR | Status: AC | PRN
Start: 2022-02-03 — End: 2022-02-03
  Administered 2022-02-03: 15:00:00 2 via INTRAVENOUS

## 2022-02-03 MED ORDER — bacitracin zinc-polymyxin B (POLYSPORIN) topical ointment
500-10000 | TOPICAL | Status: AC | PRN
Start: 2022-02-03 — End: 2022-02-03
  Administered 2022-02-03: 16:00:00 1 via TOPICAL

## 2022-02-03 MED ORDER — oxyCODONE (ROXICODONE) 5 MG immediate release tablet
5 | ORAL_TABLET | Freq: Four times a day (QID) | ORAL | 0 refills | 6.00000 days | Status: AC | PRN
Start: 2022-02-03 — End: 2022-02-06
  Filled 2022-02-03: qty 10, 3d supply, fill #0

## 2022-02-03 MED ORDER — dextrose 10%-water (D10W) IV soln
INTRAVENOUS | Status: AC | PRN
Start: 2022-02-03 — End: 2022-02-03

## 2022-02-03 MED ORDER — peppermint oiL liquid 1 mL
Status: AC | PRN
Start: 2022-02-03 — End: 2022-02-03

## 2022-02-03 MED ORDER — gabapentin (NEURONTIN) capsule 600 mg
300 | ORAL | Status: AC | PRN
Start: 2022-02-03 — End: 2022-02-03
  Administered 2022-02-03: 14:00:00 600 mg via ORAL

## 2022-02-03 MED ORDER — fentaNYL (SUBLIMAZE) injection 25 mcg
50 | INTRAMUSCULAR | Status: AC | PRN
Start: 2022-02-03 — End: 2022-02-03
  Administered 2022-02-03: 17:00:00 25 ug via INTRAVENOUS

## 2022-02-03 MED ORDER — glycopyrrolate (ROBINUL) injection
0.2 | INTRAMUSCULAR | Status: AC | PRN
Start: 2022-02-03 — End: 2022-02-03
  Administered 2022-02-03: 16:00:00 .8 via INTRAVENOUS

## 2022-02-03 MED ORDER — oxyCODONE (ROXICODONE) immediate release tablet 5 mg
5 | ORAL | Status: AC | PRN
Start: 2022-02-03 — End: 2022-02-03
  Administered 2022-02-03: 17:00:00 5 mg via ORAL

## 2022-02-03 MED ORDER — rocuronium (ZEMURON) injection
10 | INTRAVENOUS | Status: AC | PRN
Start: 2022-02-03 — End: 2022-02-03
  Administered 2022-02-03: 16:00:00 10 via INTRAVENOUS
  Administered 2022-02-03: 15:00:00 50 via INTRAVENOUS

## 2022-02-03 MED ORDER — glucose chewable tablet 12 g
4 | ORAL | Status: AC | PRN
Start: 2022-02-03 — End: 2022-02-03

## 2022-02-03 MED ORDER — fentaNYL (SUBLIMAZE) injection
50 | INTRAMUSCULAR | Status: AC | PRN
Start: 2022-02-03 — End: 2022-02-03
  Administered 2022-02-03: 15:00:00 50 via INTRAVENOUS
  Administered 2022-02-03 (×2): 25 via INTRAVENOUS

## 2022-02-03 MED ORDER — oxyCODONE (ROXICODONE) immediate release tablet 2.5 mg
5 | ORAL | Status: AC | PRN
Start: 2022-02-03 — End: 2022-02-03

## 2022-02-03 MED ORDER — meperidine (PF) (DEMEROL) Soln 12.5 mg
25 | INTRAMUSCULAR | Status: AC | PRN
Start: 2022-02-03 — End: 2022-02-03

## 2022-02-03 MED ORDER — fentaNYL (SUBLIMAZE) injection 12.5 mcg
50 | INTRAMUSCULAR | Status: AC | PRN
Start: 2022-02-03 — End: 2022-02-03
  Administered 2022-02-03: 17:00:00 12.5 ug via INTRAVENOUS

## 2022-02-03 MED ORDER — fentaNYL (SUBLIMAZE) 50 mcg/mL injection
50 | INTRAMUSCULAR | Status: AC
Start: 2022-02-03 — End: ?

## 2022-02-03 MED ORDER — proMETHazine (PHENERGAN) injection 6.25 mg
25 | Freq: Four times a day (QID) | INTRAMUSCULAR | Status: AC | PRN
Start: 2022-02-03 — End: 2022-02-03

## 2022-02-03 MED ORDER — phenylephrine (NEO-SYNEPHRINE) injection
10 | INTRAMUSCULAR | Status: AC | PRN
Start: 2022-02-03 — End: 2022-02-03
  Administered 2022-02-03: 16:00:00 100 via INTRAVENOUS

## 2022-02-03 MED ORDER — lidocaine (PF) 20 mg/mL (2 %) Soln
20 | INTRAVENOUS | Status: AC | PRN
Start: 2022-02-03 — End: 2022-02-03
  Administered 2022-02-03: 15:00:00 100 via INTRAVENOUS

## 2022-02-03 MED ORDER — lactated Ringers infusion
INTRAVENOUS | Status: AC | PRN
Start: 2022-02-03 — End: 2022-02-03
  Administered 2022-02-03 (×2): via INTRAVENOUS

## 2022-02-03 MED ORDER — acetaminophen (TYLENOL) 500 MG tablet
500 | ORAL_TABLET | Freq: Three times a day (TID) | ORAL | 1 refills | 11.00000 days | Status: AC
Start: 2022-02-03 — End: ?
  Filled 2022-02-03: qty 60, 10d supply, fill #0

## 2022-02-03 MED ORDER — lidocaine (PF) 2% (20 mg/mL) 20 mg/mL (2 %) Soln
20 | INTRAMUSCULAR | Status: AC
Start: 2022-02-03 — End: ?

## 2022-02-03 MED ORDER — lactated Ringers infusion
INTRAVENOUS | Status: AC
Start: 2022-02-03 — End: 2022-02-03

## 2022-02-03 MED ORDER — ondansetron (ZOFRAN) injection
4 | INTRAMUSCULAR | Status: AC | PRN
Start: 2022-02-03 — End: 2022-02-03
  Administered 2022-02-03: 16:00:00 4 via INTRAVENOUS

## 2022-02-03 MED ORDER — EPINEPHrine (ADRENALIN) 1 mg/mL injection
1 | INTRAMUSCULAR | Status: AC
Start: 2022-02-03 — End: ?

## 2022-02-03 MED ORDER — ondansetron (ZOFRAN) 4 mg/2 mL injection
4 | INTRAMUSCULAR | Status: AC
Start: 2022-02-03 — End: ?

## 2022-02-03 MED ORDER — albuterol (PROVENTIL) nebulizer solution 2.5 mg
2.5 | Freq: Once | RESPIRATORY_TRACT | Status: AC
Start: 2022-02-03 — End: 2022-02-03
  Administered 2022-02-03: 14:00:00 2.5 mg via RESPIRATORY_TRACT

## 2022-02-03 MED ORDER — sodium chloride 0.9 % irrigation
0.9 | Status: AC | PRN
Start: 2022-02-03 — End: 2022-02-03
  Administered 2022-02-03: 16:00:00 500

## 2022-02-03 MED ORDER — propofol 10 mg/ml (DIPRIVAN) injection
10 | INTRAVENOUS | Status: AC | PRN
Start: 2022-02-03 — End: 2022-02-03
  Administered 2022-02-03 (×3): 50 via INTRAVENOUS
  Administered 2022-02-03: 15:00:00 200 via INTRAVENOUS

## 2022-02-03 MED ORDER — ceFAZolin (ANCEF) 2 g in sodium chloride 0.9% 100 mL ADDaptor IVPB
INTRAVENOUS | Status: AC | PRN
Start: 2022-02-03 — End: 2022-02-03
  Administered 2022-02-03: 15:00:00 2 g via INTRAVENOUS

## 2022-02-03 MED ORDER — glycopyrrolate (ROBINUL) 0.2 mg/mL injection
0.2 | INTRAMUSCULAR | Status: AC
Start: 2022-02-03 — End: ?

## 2022-02-03 MED ORDER — cephALEXin (KEFLEX) 500 MG capsule
500 | ORAL_CAPSULE | Freq: Two times a day (BID) | ORAL | 0 refills | Status: AC
Start: 2022-02-03 — End: 2022-02-13
  Filled 2022-02-03: qty 20, 10d supply, fill #0

## 2022-02-03 MED ORDER — naloxone (NARCAN) injection 0.04 mg
0.4 | INTRAMUSCULAR | Status: AC | PRN
Start: 2022-02-03 — End: 2022-02-03

## 2022-02-03 MED ORDER — acetaminophen (TYLENOL) tablet 975 mg
325 | ORAL | Status: AC | PRN
Start: 2022-02-03 — End: 2022-02-03
  Administered 2022-02-03: 14:00:00 975 mg via ORAL

## 2022-02-03 MED ORDER — senna-docusate (SENNA-S) 8.6-50 mg per tablet
8.6-50 | ORAL_TABLET | Freq: Two times a day (BID) | ORAL | 0 refills | Status: AC | PRN
Start: 2022-02-03 — End: ?
  Filled 2022-02-03: qty 20, 10d supply, fill #0

## 2022-02-03 MED ORDER — rocuronium (ZEMURON) 10 mg/mL injection
10 | INTRAVENOUS | Status: AC
Start: 2022-02-03 — End: ?

## 2022-02-03 MED ORDER — neostigmine methylsulfate (PROSTIGMIN) 1 mg/mL IV solution
1 | INTRAVENOUS | Status: AC
Start: 2022-02-03 — End: ?

## 2022-02-03 MED ORDER — lidocaine-EPINEPHrine 1 %-1:100,000 injection
1 | INTRAMUSCULAR | Status: AC | PRN
Start: 2022-02-03 — End: 2022-02-03
  Administered 2022-02-03: 15:00:00 5 via SUBCUTANEOUS

## 2022-02-03 MED ORDER — ondansetron (ZOFRAN) injection 4 mg
4 | Freq: Three times a day (TID) | INTRAMUSCULAR | Status: AC | PRN
Start: 2022-02-03 — End: 2022-02-03

## 2022-02-03 MED FILL — CEFAZOLIN 2 GRAM SOLUTION FOR INJECTION: 2 2 gram | INTRAMUSCULAR | Qty: 1

## 2022-02-03 MED FILL — FENTANYL (PF) 50 MCG/ML INJECTION SOLUTION: 50 50 mcg/mL | INTRAMUSCULAR | Qty: 2

## 2022-02-03 MED FILL — ROCURONIUM 10 MG/ML INTRAVENOUS SOLUTION: 10 10 mg/mL | INTRAVENOUS | Qty: 5

## 2022-02-03 MED FILL — TYLENOL 325 MG TABLET: 325 325 mg | ORAL | Qty: 3

## 2022-02-03 MED FILL — MIDAZOLAM (PF) 1 MG/ML INJECTION SOLUTION: 1 1 mg/mL | INTRAMUSCULAR | Qty: 2

## 2022-02-03 MED FILL — ONDANSETRON HCL (PF) 4 MG/2 ML INJECTION SOLUTION: 4 4 mg/2 mL | INTRAMUSCULAR | Qty: 2

## 2022-02-03 MED FILL — OXYCODONE 5 MG TABLET: 5 5 MG | ORAL | Qty: 1

## 2022-02-03 MED FILL — NEOSTIGMINE METHYLSULFATE 1 MG/ML INTRAVENOUS SOLUTION: 1 1 mg/mL | INTRAVENOUS | Qty: 10

## 2022-02-03 MED FILL — XYLOCAINE-MPF 20 MG/ML (2 %) INJECTION SOLUTION: 20 20 mg/mL (2 %) | INTRAMUSCULAR | Qty: 5

## 2022-02-03 MED FILL — ADRENALIN 1 MG/ML INJECTION SOLUTION: 1 1 mg/mL | INTRAMUSCULAR | Qty: 30

## 2022-02-03 MED FILL — ALBUTEROL SULFATE 2.5 MG/3 ML (0.083 %) SOLUTION FOR NEBULIZATION: 2.5 2.5 mg /3 mL (0.083 %) | RESPIRATORY_TRACT | Qty: 3

## 2022-02-03 MED FILL — GLYCOPYRROLATE 0.2 MG/ML INJECTION SOLUTION: 0.2 0.2 mg/mL | INTRAMUSCULAR | Qty: 2

## 2022-02-03 MED FILL — GABAPENTIN 300 MG CAPSULE: 300 300 MG | ORAL | Qty: 2

## 2022-02-03 NOTE — Unmapped (Signed)
[place]  DEPARTMENT OF ANESTHESIOLOGY  PRE-PROCEDURAL EVALUATION    Mary [name] is a 51 y.o. year old female presenting for:    Procedure(s):  endoscopic sinus surgery with septoplasty and inferior turbinate reductions.    Surgeon:   [name], MD    Chief Complaint         Review of Systems     Anesthesia Evaluation    Patient summary reviewed and nursing notes reviewed.       I have reviewed the History and Physical Exam, any relevant changes are noted in the anesthesia pre-operative evaluation.      Cardiovascular:    Exercise tolerance: good  Duke Met score: 5 - Walking four miles per hour. Social dancing. Washing a car.    Neuro/Muscoloskeletal/Psych:    (+) psychiatric history (ADD), anxiety and depression.      Pulmonary:    (+) asthma (controlled on inhalers, no ER visits nor hospitalizations).    ROS comment: PFTs 10/16  Obstructive defect is present.  Spirometry shows moderately severe obstructive defect.  There is a significant response to bronchodilator demonstrated.  Air trapping is present.  Diffusing capacity is elevated and may suggest asthma      GI/Hepatic/Renal:        Comments: Diverticulitis    Endo/Other:    (+) anemia (Fe deficiency).        Comments: Calcification Rt breast  Endometrial polyp       Past Medical History     Past Medical History:   Diagnosis Date   ??? ADD (attention deficit disorder)    ??? Allergy    ??? Anemia    ??? Anxiety    ??? Asthma    ??? Cancer (CMS-HCC)    ??? Cervical dysplasia    ??? Chronic back pain    ??? Depression    ??? Diverticulitis    ??? Environmental allergies    ??? Iron deficiency    ??? Varicella    ??? Vitamin D deficiency        Past Surgical History     Past Surgical History:   Procedure Laterality Date   ??? ARTHROSCOPY KNEE MENISECTOMY, MENISCAL REPAIR Right 2013    right   ??? AUGMENTATION BREAST ENDOSCOPIC  2001    [place], [place]  implant replacement due to left implant rupture    ??? AUGMENTATION MAMMAPLASTY  age [age]    [place], [place]   ??? BIOPSY BREAST NEEDLE  LOCALIZATION Right 06/02/2016    Procedure: RIGHT BREAST NEEDLE LOCALIZED EXCISIONAL BIOPSY OF 2 AREAS,   ;  Surgeon: Cristal Generous, MD;  Location: [facility] OR;  Service: General;  Laterality: Right;   ??? BREAST EXCISIONAL BIOPSY      atypical cells   ??? CERVICAL CONE BIOPSY     ??? COSMETIC SURGERY     ??? ENDOMETRIAL BIOPSY      benign polyp   ??? HYSTEROSCOPY ABLATION ENDOMETRIAL N/A 06/02/2016    Procedure: HYSTEROSCOPIC RESECTION OF ENDOMETRIAL POLYP;  Surgeon: Raylene Everts;  Location: [facility] OR;  Service: Gynecology;  Laterality: N/A;   ??? uterine biopsy       x2       Family History     Family History   Problem Relation Age of Onset   ??? Uterine Cancer Mother 36        uterine vs cervical    ??? Hypertension Father    ??? Asthma Father    ??? Alcohol abuse Father    ???  Lung Cancer Father    ??? Bone Cancer Paternal Grandmother 33   ??? Asthma Other    ??? Allergies Other    ??? Melanoma Neg Hx    ??? Breast Cancer Neg Hx    ??? Ovarian cancer Neg Hx    ??? Colon Cancer Neg Hx    ??? Pancreatic Cancer Neg Hx    ??? Prostate Cancer Neg Hx    ??? Anesthesia problems Neg Hx        Social History     Social History     Socioeconomic History   ??? Marital status: Married     Spouse name: Not on file   ??? Number of children: Not on file   ??? Years of education: Not on file   ??? Highest education level: Not on file   Occupational History   ??? Not on file   Tobacco Use   ??? Smoking status: Never   ??? Smokeless tobacco: Never   ??? Tobacco comments:     02/24/2011; passive smoke exposure:no (06/18/2005)   Vaping Use   ??? Vaping Use: Never used   Substance and Sexual Activity   ??? Alcohol use: Yes     Alcohol/week: 4.0 standard drinks     Types: 4 Glasses of wine per week     Comment: socially   ??? Drug use: No     Comment: 01/07/2011   ??? Sexual activity: Yes     Partners: Female   Other Topics Concern   ??? Caffeine Use No   ??? Occupational Exposure No   ??? Exercise Yes   ??? Seat Belt Yes   Social History Narrative    Currently not working. Lives in a newer home with husband  and 2 kids, no known mold in the home, no pets at home, no occupational exposures.      Social Determinants of Health     Financial Resource Strain: Not on file   Physical Activity: Not on file   Stress: Not on file   Social Connections: Not on file   Housing Stability: Not on file       Medications     Allergies:  No Known Allergies    Home Meds:  Prior to Admission medications as of 02/03/22 0936   Medication Sig Taking?   albuterol (PROAIR HFA) 90 mcg/actuation inhaler Inhale 2 puffs into the lungs every 6 hours as needed for Wheezing.    azelastine (ASTELIN) 137 mcg (0.1 %) nasal spray Use 1 spray into each nostril 2 times a day. Use in each nostril as directed    budesonide-formoteroL (SYMBICORT) 160-4.5 mcg/actuation inhaler Inhale 1 puff into the lungs 2 times a day.    cetirizine (ZYRTEC) 10 MG tablet Take 1 tablet (10 mg total) by mouth daily as needed for Allergies.    cholecalciferol, vitamin D3, 125 mcg (5,000 unit) Tab Take by mouth.    dextroamphetamine-amphetamine (ADDERALL XR) 20 MG 24 hr capsule Take 2 capsules (40 mg total) by mouth every morning for 30 days.    dextroamphetamine-amphetamine (ADDERALL XR) 20 MG 24 hr capsule Take 2 capsules (40 mg total) by mouth every morning for 30 days.    dextroamphetamine-amphetamine (ADDERALL XR) 20 MG 24 hr capsule Take 2 capsules (40 mg total) by mouth every morning for 30 days.    dextroamphetamine-amphetamine (ADDERALL) 20 mg Tab Take 1 tablet (20 mg total) by mouth in the morning and at bedtime for 30 days.    escitalopram oxalate (  LEXAPRO) 20 MG tablet Take 1 tablet (20 mg total) by mouth daily.    FERROUS SULFATE (IRON ORAL) Take by mouth.    fluticasone (FLONASE) 50 mcg/actuation nasal spray SHAKE LIQUID AND USE 1 TO 2 SPRAYS IN EACH NOSTRIL DAILY AS NEEDED    montelukast (SINGULAIR) 10 mg tablet Take 1 tablet (10 mg total) by mouth at bedtime.    predniSONE (DELTASONE) 20 MG tablet 1 by mouth twice daily x 3 days then 1 by mouth once daily x 4  days  Patient taking differently: 1 by mouth twice daily x 3 days then 1 by mouth once daily x 4 days  As needed        Inpatient Meds:  Scheduled:     Continuous:       PRN:     Vital Signs     Wt Readings from Last 3 Encounters:   02/03/22 165 lb (74.8 kg)   01/22/22 165 lb (74.8 kg)   01/22/22 165 lb (74.8 kg)     Ht Readings from Last 3 Encounters:   02/03/22 5' 6 (1.676 m)   01/22/22 5' 9 (1.753 m)   01/22/22 5' 9 (1.753 m)     Temp Readings from Last 3 Encounters:   01/22/22 98.6 ??F (37 ??C) (Temporal)   06/08/18 98.7 ??F (37.1 ??C) (Oral)     BP Readings from Last 3 Encounters:   02/03/22 143/80   01/22/22 (!) 146/94   01/22/22 141/89     Pulse Readings from Last 3 Encounters:   02/03/22 66   01/22/22 93   01/22/22 103     SpO2 Readings from Last 3 Encounters:   06/02/16 98%   05/10/16 98%   02/27/16 98%       Physical Exam     Airway:     Mallampati: I  Mouth Opening: >2 FB  TM distance: > = 3 FB  Neck ROM: full    Dental:   - No obvious cracked, loose, chipped, or missing teeth.     Pulmonary:   - normal exam    Breath sounds clear to auscultation.       Cardiovascular:  - normal exam   Rhythm: regular    Neuro/Musculoskeletal/Psych:    Mental status: alert and oriented to person, place and time.          Abdominal:       Current OB Status:       Other Findings:        Laboratory Data     Lab Results   Component Value Date    WBC 4.5 01/22/2022    HGB 13.2 01/22/2022    HCT 39.6 01/22/2022    MCV 88.8 01/22/2022    PLT 262 01/22/2022       No results found for: [facility]    Lab Results   Component Value Date    GLUCOSE 86 01/22/2022    BUN 11 01/22/2022    CO2 25 01/22/2022    CREATININE 0.65 01/22/2022    K 4.3 01/22/2022    NA 136 01/22/2022    CL 101 01/22/2022    CALCIUM 9.4 01/22/2022    ALBUMIN 4.5 10/01/2020    PROT 7.2 10/01/2020    ALKPHOS 47 10/01/2020    ALT 12 10/01/2020    AST 30 09/30/2017    BILITOT 0.8 10/01/2020       No results found for: PTT, INR    Lab Results   Component  Value Date     PREGTESTUR Negative 06/02/2016       Anesthesia Plan     ASA 2         Female and current non-smoker    Anesthesia Type:  MAC.      PONV Risk Factors: female, current non-smoker,  plan for postoperative opioid use.              Induction:   Intravenous induction.    (PIV  multimodal)  Anesthetic plan and risks discussed with patient.    Plan, alternatives, and risks of anesthesia, including death, have been explained to and discussed with the patient/legal guardian.  By my assessment, the patient/legal guardian understands and agrees.  Scenario presented in detail.  Questions answered.    Use of blood products discussed with who consented to blood products.       Plan discussed with CRNA and attending.

## 2022-02-03 NOTE — Unmapped (Signed)
1. Activity:  No lifting more than 5-10lbs or strenuous activity for 1-2 weeks  May return to work as tolerated  2. Diet:  Resume regular diet by mouth  3. Incision/wound care:   Use Afrin for no longer than 3 days as needed for nose bleeds.   Small amounts of nasal drainage is expected.  You have stents in your nose that you can remove tomorrow.  After you remove them, start saline rinses.  4. Pain Management: Take pain medications as prescribed and no driving while taking narcotic pain medications.   5. Follow up:  Please follow up as scheduled below you can call [phone] to confirm  6. Other instructions:   Please go to the Emergency Room or call our clinic if you experience any of the following:   Significant swelling or visual changes, excessive clear/watery drainage,   Temperature greater than 101.5? F.   Change in mental status, groggy or disorientation.   Chest pain, shortness of breath, persistent dizziness, swelling in one or both legs  Sudden changes in voice, swallowing, or vision.    Discharge Medications:     Medication List        TAKE these medications, which are NEW        Quantity/Refills   acetaminophen 500 MG tablet  Commonly known as: TYLENOL  Take 2 tablets (1,000 mg total) by mouth every 8 hours.   Quantity: 60 tablet  Refills: 1     cephALEXin 500 MG capsule  Commonly known as: KEFLEX  Take 1 capsule (500 mg total) by mouth 2 times a day for 10 days.   Quantity: 20 capsule  Refills: 0     oxyCODONE 5 MG immediate release tablet  Commonly known as: ROXICODONE  Take 1 tablet (5 mg total) by mouth every 6 hours as needed for Pain (Severe Pain) for up to 3 days.   Quantity: 10 tablet  Refills: 0     senna-docusate 8.6-50 mg per tablet  Commonly known as: SENNA-S  Take 1 tablet by mouth every 12 hours as needed for Constipation.   Quantity: 20 tablet  Refills: 0            TAKE these medication, which have CHANGED        Quantity/Refills   predniSONE 20 MG tablet  Commonly known as: DELTASONE  1  by mouth twice daily x 3 days then 1 by mouth once daily x 4 days  What changed: additional instructions   Quantity: 10 tablet  Refills: 0            TAKE these medications, which you were ALREADY TAKING        Quantity/Refills   albuterol 90 mcg/actuation inhaler  Commonly known as: ProAir HFA  Inhale 2 puffs into the lungs every 6 hours as needed for Wheezing.   Quantity: 3 Inhaler  Refills: 3     azelastine 137 mcg (0.1 %) nasal spray  Commonly known as: ASTELIN  Use 1 spray into each nostril 2 times a day. Use in each nostril as directed   Quantity: 30 mL  Refills: 1     budesonide-formoteroL 160-4.5 mcg/actuation inhaler  Commonly known as: SYMBICORT  Inhale 1 puff into the lungs 2 times a day.   Quantity: 10.2 g  Refills: 3     cetirizine 10 MG tablet  Commonly known as: ZYRTEC  Take 1 tablet (10 mg total) by mouth daily as needed for Allergies.   Refills: 0  cholecalciferol (vitamin D3) 125 mcg (5,000 unit) Tab  Take by mouth.   Refills: 0     * dextroamphetamine-amphetamine 20 MG 24 hr capsule  Commonly known as: ADDERALL XR  Take 2 capsules (40 mg total) by mouth every morning for 30 days.   Quantity: 60 capsule  Refills: 0     * dextroamphetamine-amphetamine 20 mg Tab  Commonly known as: AdderalL  Take 1 tablet (20 mg total) by mouth in the morning and at bedtime for 30 days.   Quantity: 60 tablet  Refills: 0     * dextroamphetamine-amphetamine 20 MG 24 hr capsule  Commonly known as: ADDERALL XR  Take 2 capsules (40 mg total) by mouth every morning for 30 days.  Start taking on: Feb 27, 2022   Quantity: 60 capsule  Refills: 0     * dextroamphetamine-amphetamine 20 MG 24 hr capsule  Commonly known as: Adderall XR  Take 2 capsules (40 mg total) by mouth every morning for 30 days.  Start taking on: March 29, 2022   Quantity: 60 capsule  Refills: 0     escitalopram oxalate 20 MG tablet  Commonly known as: LEXAPRO  Take 1 tablet (20 mg total) by mouth daily.   Quantity: 90 tablet  Refills: 3     fluticasone  propionate 50 mcg/actuation nasal spray  Commonly known as: FLONASE  SHAKE LIQUID AND USE 1 TO 2 SPRAYS IN EACH NOSTRIL DAILY AS NEEDED   Quantity: 16 g  Refills: 0     IRON ORAL  Take by mouth.   Refills: 0     montelukast 10 mg tablet  Commonly known as: SINGULAIR  Take 1 tablet (10 mg total) by mouth at bedtime.   Quantity: 90 tablet  Refills: 3           * This list has 4 medication(s) that are the same as other medications prescribed for you. Read the directions carefully, and ask your doctor or other care provider to review them with you.                   Where to Get Your Medications        These medications were sent to [facility]  [address] [place] [address]      Hours: Sunday - Saturday: 8:00AM - 6:00PM Phone: [phone]   acetaminophen 500 MG tablet  cephALEXin 500 MG capsule  oxyCODONE 5 MG immediate release tablet  senna-docusate 8.6-50 mg per tablet         Post Discharge Medical Supplies and Care Needs:     Appointments:  Patient will have follow up appointment in ENT clinic        Future Appointments   Date Time Provider Department Center   02/05/2022  3:00 PM ALLERGY INJECTION [facility] ENT [facility]   02/11/2022 11:00 AM [name], MD [facility] ENT [facility]   02/11/2022  1:00 PM ALLERGY INJECTION [facility] UCP [facility]CP ENT [facility]   02/17/2022 10:40 AM ALLERGY INJECTION WCN UCP UCP ENT [facility]   02/24/2022  1:30 PM ALLERGY INJECTION WCN UCP UCP ENT [facility]CN [facility]   03/03/2022  2:50 PM ALLERGY INJECTION WCN UCP UCP ENT [facility]   03/10/2022  1:30 PM ALLERGY INJECTION WCN UCP UCP ENT [facility]   03/17/2022  1:30 PM ALLERGY INJECTION WCN UCP UCP ENT [facility]CN [facility]   03/24/2022  1:30 PM ALLERGY INJECTION WCN UCP UCP ENT WCN  WCN   03/31/2022  1:30 PM ALLERGY INJECTION WCN UCP UCP ENT WCN [facility]         Additional Follow Up Actions:   PCP  Melissa MontaneManoj Singh, MD  [phone] [place] [id number]  [phone]  [phone]

## 2022-02-03 NOTE — Unmapped (Signed)
Anesthesia Post Note    Patient: Mary [name]    Procedure(s) Performed: Procedure(s):  endoscopic sinus surgery with septoplasty and inferior turbinate reductions.    Anesthesia type: MAC    Patient location: PACU    Airway: Patent    Post pain: Adequate analgesia    Nausea / Vomiting: Absent    Post-operative Hydration Status: Adequate    Post assessment: no apparent anesthetic complications and tolerated procedure well    Last Vitals:   Vitals:    02/03/22 1253 02/03/22 1300 02/03/22 1315 02/03/22 1330   BP: 118/51 118/51 110/57 105/54   BP Location:    Left arm   Patient Position:    Lying   Pulse: 73 69 69 67   Resp: 18 13 18 11    Temp:  98 ??F (36.7 ??C) 98.3 ??F (36.8 ??C) 98.3 ??F (36.8 ??C)   TempSrc:  Axillary Axillary Axillary   SpO2: 99% 97% 96% 95%   Weight:       Height:            Post vital signs: stable    Level of consciousness: awake and alert     Complications:  There were no known notable events for this encounter.

## 2022-02-03 NOTE — Unmapped (Signed)
PACU / SDS Patient Care Handoff    Patient: Mary [name],  51 y.o.,  female        PACU RN: Raynelle Fanning Secilia Apps RN      Bay: 14      ASCOM #: [phone]      Anesthesia type:  General      Sign Out:  Yes       Discharge Order/ Instructions:  Yes     Procedure(s) Performed: Procedure(s):  endoscopic sinus surgery with septoplasty and inferior turbinate reductions.    Surgeon: Surgeon(s):  [name], MD    Team: ent    Allergies: No Known Allergies    Diabetic / Spine: No     Surgical Site: Nose    Pain Score: Four        Nerve Block: no    Nauseated: No     Current Vital Signs:   Vitals:    02/03/22 1330   BP: 105/54   Pulse: 67   Resp: 11   Temp: 98.3 ??F (36.8 ??C)   SpO2: 95%       Meds given in PACU:  Fentanyl 37.5 mcg   Dilaudid 0 mg   Morphine 0 mg  PO Oxycodone 5 mg  Other   PIV:   Peripheral IV 02/03/22 Right Antecubital (Active)   Site Assessment No problems identified 02/03/22 0941   Line Status Blood return noted;Flushed 02/03/22 0941   Dressing Status Clean;Dry;Intact 02/03/22 0941   Dressing Intervention New dressing 02/03/22 0941     IVF: lactated Ringer's  Left to infuse: 300 mL    Tylenol given:  Pre-op    Void:  No     Tolerating PO: Yes          Amount 120         Comment / Concerns / Additional Info: Pick up these medications from [facility]  1 acetaminophen  2 cephALEXin  3 oxyCODONE  4 senna-docusate  Discharge Instructions in AVS for surgey

## 2022-02-03 NOTE — Unmapped (Signed)
Anesthesia Extubation Criteria:    Airway Device: endotracheal tube    Emergence Details:      Smooth      _x_      Stormy       __       Prolonged   __     Extubation Criteria:      Motor strength intact       _x_      Follows commands        _x_      Good airway reflexes      _x_      OP suctioned                  _x_        Follows commands:  Yes     Patient extubated:  Yes

## 2022-02-03 NOTE — Unmapped (Signed)
No changes to health since I last saw her.    [name], MD, PhD

## 2022-02-03 NOTE — Unmapped (Signed)
INTRA-OP POST BRIEFING NOTE: Mary [name]      Specimens:   Specimens     ID Source Type Tests Collected By Collected At Frozen? Attributes Order ID Breast Spec Formalin Marked as Sent    A Sinus Tissue ?? SURGICAL PATHOLOGY Turner DanielsEXAM   Ahmad Sedaghat, MD 02/03/22 1204 Yes Sent in Formalin ?? [id number]        Comment: A. SINUS CONTENTS    B Sinus Tissue ?? SURGICAL PATHOLOGY EXAM   Thora LanceAhmad Sedaghat, MD 02/03/22 1231 Yes Fresh ?? [id number]        Comment: B. SEPTUM- GROSS ONLY          Prior to leaving the room: Nurse confirmed name of procedure, completion of instrument, sponge & needle counts, reads specimen labels aloud including patient name and addresses any equipment issues? Nurse confirmed wound class. Nurse to surgeon and anesthesia: What are key concerns for recovery and management of the patient?  Yes      Blood products stored at appropriate temperatures prior to return to blood bank (if applicable)? N/A      Patient identification band secured on patient prior to transfer out of the operating room? Yes    Temporary devices implanted for the duration of the surgery removed and evaluated for intactness and completeness prior to closure? N/A      Other Comments:     Signed: Rance Muirlison Campbell    Date: 02/03/2022    Time: 12:32 PM

## 2022-02-03 NOTE — Unmapped (Signed)
Anesthesia Transfer of Care Note    Patient: Mary [name]  Procedure(s) Performed: Procedure(s):  endoscopic sinus surgery with septoplasty and inferior turbinate reductions.    Patient location: PACU    Anesthesia type: MAC    Airway Device on Arrival to PACU/ICU: Nasal Cannula    IV Access: Peripheral    Monitors Recommended to be Used During PACU/ICU: Standard Monitors    Outstanding Issues to Address: None    Level of Consciousness: awake and alert     Post vital signs:    Vitals:    02/03/22 0933   BP: 112/52   Pulse: 80   Resp: 16   SpO2: 99%       Complications:  No notable events documented.    Date 02/02/22 0700 - 02/03/22 0659(Not Admitted) 02/03/22 0700 - 02/04/22 0659   Shift 0700-1459 1500-2259 2300-0659 24 Hour Total 0700-1459 1500-2259 2300-0659 24 Hour Total   INTAKE   I.V.     1300(17.4)   1300(17.4)     Volume (mL) (lactated Ringers infusion)     1300   1300   IV Piggyback     100   100     Volume (mL) (ceFAZolin (ANCEF) 2 g in sodium chloride 0.9% 100 mL ADDaptor IVPB)     100   100   Shift Total(mL/kg)     1400(18.7)   1400(18.7)   OUTPUT   Shift Total(mL/kg)           Weight (kg)     74.8 74.8 74.8 74.8

## 2022-02-03 NOTE — Unmapped (Signed)
Pt stable and meets criteria for discharge. Pt has been tolerating clears with no complaints of nausea. Pt's states pain in controlled at a 3. Pt has voided.

## 2022-02-03 NOTE — Unmapped (Signed)
[place]               [facility]      PATIENT NAME:      Mary [name], Mary [name] ??             MRN:               [id number]  DATE OF BIRTH:     04-16-1971                    CSN:               [id number]  PHYSICIAN:         Blair Heys, MD         ADMIT DATE:        02/03/2022  DICTATED BY:       Blair Heys, MD         SURGERY DATE:      02/03/2022                              OPERATIVE REPORT    SURGEON:  Blair Heys, MD      PREOPERATIVE DIAGNOSES:    1. Chronic maxillary rhinosinusitis.  2. Nasal septal deviation.  3. Bilateral inferior turbinate hypertrophy.  4. 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    POSTOPERATIVE DIAGNOSES:    1. Chronic maxillary rhinosinusitis.  2. Nasal septal deviation.  3. Bilateral inferior turbinate hypertrophy.  4. 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    PROCEDURES:    1. Endoscopic septoplasty.  2. Bilateral endoscopic maxillary antrostomy.  3. Bilateral submucosal resection of inferior turbinates with outfracture.  4. Bilateral radiofrequency ablation of abnormal tissue hypertrophy of the septal swell bodies (2 total treatments).    ANESTHESIA:  General.    INDICATIONS FOR PROCEDURE:  This is a 51 year old female with a history of chronic sinonasal symptomatology.  Outpatient evaluation including nasal endoscopy and sinus CT scan revealed the above diagnoses.  Her symptoms were recalcitrant to appropriate   medical management.  The decision was thus made to proceed with today's procedures.    FINDINGS:    1. Inflamed mucosa within the maxillary sinuses.  2. Bilateral abnormal tissue hypertrophy of the septal swell bodies was identified on the nasal mask at the beginning of the case and deemed to be obstructive in nature.    DESCRIPTION OF PROCEDURE:  The patient was met in the preoperative holding area, where her identity and procedures were confirmed.   All last minute questions and concerns were addressed.  The patient was then brought back to the operating room, where   general anesthesia was induced by oral endotracheal intubation.  A time-out was performed.  The patient's identity and procedures were once again confirmed.  The patient was positioned, draped in the usual sterile fashion.  The 0-degree endoscope was   brought in, both nasal cavities were inspected.  Findings were as noted above.  The septum as well as both maxillary lines were injected with 1% lidocaine with 1:100,000 solution of epinephrine.  Both nasal cavities were packed with 1:1000   epinephrine-soaked pledgets.  After a minute, the pledgets were removed.  Attention was turned to performance of the surgery.  First, the bilateral abnormal tissue hypertrophy of the septal swell bodies was treated with the VivAer radiofrequency wand for   a total  of 2 treatments in 2 distinct sites.  Next, I turned my attention to performance of the septoplasty.  A vertical incision was made in the mucosa of the septum through the left naris.  This incision was carried down to the subperichondrial plane.    A subperichondrial and subperiosteal mucosal flap were raised on the left.  Next, the septal cartilage was transected anteriorly into the right subperichondrial plane.  A subperichondrial and subperiosteal mucosal flap were raised on the right.  Once   all deviated portions of septal cartilage and bone were freed of overlying mucosal attachments, all deviated portions of septal cartilage and bone were removed with combination of Takahashi forceps, swivel knife, and Jansen-Middleton rongeur.  At this   point, the mucosal flaps were returned to the midline.  Attention was turned to performance of sinus surgery on the left side.  The left middle turbinate was medialized with a Therapist, nutritional.  This revealed the uncinate process.  The infundibulum was   palpated with a double ball-tip probe.  The inferior  attachment of the uncinate was resected with pediatric side biter.  The superior portion of the uncinate was resected with the microdebrider.  This revealed the natural os of the maxillary sinus.  The   antrostomy was started at the natural os and going through posterior fontanelle with thru-cutting instruments as well as microdebrider.  The antrostomy was then widened inferiorly in a posterior to anterior manner up to the anterior limit of maxillary   line with side-biting instruments as well as microdebrider.  Next, I turned my attention to performance of sinus surgery on the right side.  The right middle turbinate was medialized with a Therapist, nutritional.  This revealed the uncinate process.  The   infundibulum was palpated with the help of a ball-tip probe.  The inferior attachment of the uncinate was resected with pediatric side biter.  The superior portion of the uncinate was resected with microdebrider.  This revealed the natural os of the   maxillary sinus.  The antrostomy was started at the natural os and going through the posterior fontanelle with thru-cutting instruments as well as microdebrider.  The antrostomy was then widened inferiorly in a posterior to anterior manner up to the   anterior limit of the maxillary line with side-biting instruments as well as a microdebrider.  Next, I turned my attention to performance of the inferior turbinate reductions.  Stab incisions were made in the anterior heads of both inferior turbinates.    The medial mucosa was raised off both inferior turbinate bones.  The submucosal resections were then performed bilaterally with a 2.9 mm blade of the microdebrider in order to debulk submucosal soft tissue as well as bone.  Finally, both inferior   turbinates were outfractured with a Careers information officer.  At this point, with all procedures completed, both nasal cavities were suctioned of all blood and debris.  Both nasal cavities were packed with finger cots, which were secured  anteriorly across the   columella.  The patient was then turned back over the care of the general anesthesia team.  She was gradually awakened, extubated, and transferred to PACU in stable condition.  I was scrubbed and present for all portions of the case.      Blair Heys, MD    ARS/AQ  DD:?? 02/03/2022 13:08:16  DT:?? 02/04/2022 04:40:55    JOB#:  [id number]

## 2022-02-03 NOTE — Unmapped (Signed)
endoscopic sinus surgery with septoplasty and inferior turbinate reductions.  Brief Op Note  Mary [name]  02/03/2022      Pre-op Diagnosis: Nasal septal deviation [J34.2]  Hypertrophy of inferior nasal turbinate [J34.3]  Chronic rhinosinusitis [J31.0, J32.9]       Post-op Diagnosis: same    Procedure(s):  endoscopic sinus surgery with septoplasty and inferior turbinate reductions.      Surgeon(s):  [name], MD    Anesthesia: General    Staff:   Circulator: Rance Muir, RN  Relief Circulator: Carlean Purl, RN  Relief Scrub: Thressa Sheller  Scrub Person: Tye Maryland  Resident: Hinton Rao, MD    Estimated Blood Loss: Minimal                 Specimens:   Specimens     ID Description Commments Type Source Tests Collected By Collected At    A A. SINUS CONTENTS A. SINUS CONTENTS Tissue Sinus ?? SURGICAL PATHOLOGY EXAM   [name], MD 02/03/22 1204                 Drains:  none             There were no complications unless listed below.         Mary [name]     Date: 02/03/2022  Time: 12:18 PM

## 2022-02-05 ENCOUNTER — Ambulatory Visit: Payer: PRIVATE HEALTH INSURANCE

## 2022-02-10 ENCOUNTER — Ambulatory Visit: Payer: PRIVATE HEALTH INSURANCE

## 2022-02-11 ENCOUNTER — Ambulatory Visit: Admit: 2022-02-11 | Discharge: 2022-02-11 | Payer: PRIVATE HEALTH INSURANCE

## 2022-02-11 ENCOUNTER — Institutional Professional Consult (permissible substitution): Payer: PRIVATE HEALTH INSURANCE

## 2022-02-11 DIAGNOSIS — J328 Other chronic sinusitis: Secondary | ICD-10-CM

## 2022-02-11 NOTE — Unmapped (Signed)
Extract #1: Escalating: DM, CT, DG, M  Dose: 0.05 ml  Site: right arm  Administered by: G.Ileanna Gemmill RN    Extract #2: Escalating: TR, GR, W  Dose: 0.05 ml  Site: left arm  Administered by: G.Merelin Human RN    No noted reactions from the previous injection. The patient denies any fever or any other symptoms from the last 24 hours.

## 2022-02-11 NOTE — Unmapped (Signed)
[facility]  Department of Otolaryngology--Head and Neck Surgery  Division of Rhinology, Allergy, and Endoscopic Skull Base Surgery  Blair Heys, MD, PhD, [facility] Building  [address] [place], [place] [address]  Telephone: [phone]        Pt name: Mary [name]  Date: 02/11/2022   MRN: [id number]      History Present Illness:  This is Mary [name] back in follow up after bilateral maxillary antrostomies, septoplasty and inferior turbinate reductions on Feb 03, 2022.  Postoperatively she has been doing well and using her saline rinses.      Surgical pathology:     A. ?? Sinus contents, excision:   - Fragments of sinonasal mucosa with mild chronic inflammation and   fibrosis.   - Negative for mucosal eosinophilia.   - Benign bone and mucin.   - Negative for malignancy.     B. ?? Septum, excision:   ???? ?? - Small portion of sinonasal mucosa with fibrosis   ???? ?? - Negative for mucosal eosinophilia   - Cartilage and bone   - Negative for malignancy       Cultures taken in OR (if applicable):     N/A      Procedure: Because of the patients recent surgery, I performed bilateral nasal endoscopy after application of topical lidocaine and oxymetazoline, and obtaining verbal consent from the patient.  On inspection of the interior of the nasal cavity, I examined the inferior turbinates, middle turbinates, middle meatuses, superior meatuses and sphenoethmoid recesses.  Visualization of the superior meatuses and sphenoethmoid recesses was obscured by the middle turbinate and the septum.  All structures appeared normal/unremarkable except as noted below:      There was some generalized and normal post-operative edema in the paranasal sinuses bilaterally; crusting and mucous were debrided and suctioned from the paranasal sinus cavities.      Impression:  Chronic rhinosinusitis  Environmental allergies    Plan:  We discussed the need for long-term medical management of chronic  rhinosinusitis and environmental allergies.    Continue 3 times daily saline rinses    Start budesonide rinses BID     Follow-up with me in 1 month    Follow-up with me in 1 months    For the long run, I would likely have her use the budesonide twice a day for several months and then transition her to daily rinses.      Attestation: in the case of resident participation in the care of this patient, I attest that I have seen and examined the patient, formulated the plan and agree with the documentation.      Blair Heys, MD, PhD, FACS  Division of Rhinology, Allergy, and Endoscopic Skull Base Surgery  Department of Otolaryngology--Head and Neck Surgery  [facility]  [place] [facility]    CC:   Cleone Slim, MD      **This note was dictated with voice-recognition software**

## 2022-02-17 ENCOUNTER — Institutional Professional Consult (permissible substitution): Admit: 2022-02-17 | Discharge: 2022-02-17 | Payer: PRIVATE HEALTH INSURANCE

## 2022-02-17 DIAGNOSIS — J3089 Other allergic rhinitis: Secondary | ICD-10-CM

## 2022-02-17 NOTE — Unmapped (Signed)
Extract #1: Escalating: DM, CT, DG, M  Dose: 0.1 ml  Site: right arm  Administered by: G.Tangala Wiegert RN    Extract #2: Escalating: TR, GR, W  Dose: 0.1 ml  Site: left arm  Administered by: G.Quincy Prisco RN    No noted reactions from the previous injection. The patient denies any fever or any other symptoms from the last 24 hours.

## 2022-02-24 ENCOUNTER — Institutional Professional Consult (permissible substitution)
Admit: 2022-02-24 | Discharge: 2022-02-24 | Payer: PRIVATE HEALTH INSURANCE | Attending: Clinical & Laboratory Immunology

## 2022-02-24 DIAGNOSIS — J3089 Other allergic rhinitis: Secondary | ICD-10-CM

## 2022-02-24 NOTE — Unmapped (Signed)
Patient hasn't had any reactions from their allergy injections. Doing well with their Immunotherapy. Mold and pollens avoidence guidelines were discussed. Patient was advised to continue their nasal rinsing and stay inside when mold and pollen counts were high. Patient was shone their allergy vials to verify their name and date of birth.

## 2022-03-03 ENCOUNTER — Institutional Professional Consult (permissible substitution): Admit: 2022-03-03 | Payer: PRIVATE HEALTH INSURANCE

## 2022-03-03 DIAGNOSIS — J3089 Other allergic rhinitis: Secondary | ICD-10-CM

## 2022-03-03 MED ORDER — dextroamphetamine-amphetamine (ADDERALL) 20 mg Tab
20 | ORAL_TABLET | Freq: Two times a day (BID) | ORAL | 0 refills | Status: AC
Start: 2022-03-03 — End: 2022-04-02

## 2022-03-03 MED ORDER — dextroamphetamine-amphetamine (ADDERALL) 20 mg Tab
20 | ORAL_TABLET | Freq: Two times a day (BID) | ORAL | 0 refills | Status: AC
Start: 2022-03-03 — End: 2022-05-02

## 2022-03-03 NOTE — Unmapped (Signed)
Extract #1: Escalating: DM, CT, DG, M  Dose: 0.2 ml  Site: right arm  Administered by: G.Kiearra Oyervides RN    Extract #2: Escalating: TR, GR, W  Dose: 0.15 ml  Site: left arm  Administered by: G.Arlesia Kiel RN    Noted a large local reaction from extract #2.

## 2022-03-10 ENCOUNTER — Institutional Professional Consult (permissible substitution): Admit: 2022-03-10 | Payer: PRIVATE HEALTH INSURANCE

## 2022-03-10 DIAGNOSIS — J3089 Other allergic rhinitis: Secondary | ICD-10-CM

## 2022-03-10 NOTE — Unmapped (Signed)
Extract #1: Escalating: DM, CT, DG, M  Dose: 0.25 ml  Site: right arm  Administered by: G.Gunner Iodice RN    Extract #2: Escalating: TR, GR, W  Dose: 0.2 ml  Site: left arm  Administered by: G.Aleshia Cartelli RN    Noted a large local reaction from extract #2.

## 2022-03-15 ENCOUNTER — Institutional Professional Consult (permissible substitution): Admit: 2022-03-15 | Discharge: 2022-03-15 | Payer: PRIVATE HEALTH INSURANCE

## 2022-03-15 DIAGNOSIS — J3089 Other allergic rhinitis: Secondary | ICD-10-CM

## 2022-03-15 NOTE — Unmapped (Signed)
Extract #1: Escalating: DM, CT, DG, M  Dose: 0.3 ml  Site: right arm  Administered by: G.Montre Harbor RN    Extract #2: Escalating: TR, GR, W  Dose: 0.25 ml  Site: left arm  Administered by: G.Cashius Grandstaff RN    Noted a large local reaction from extract #2.

## 2022-03-17 ENCOUNTER — Ambulatory Visit: Payer: PRIVATE HEALTH INSURANCE

## 2022-03-18 ENCOUNTER — Ambulatory Visit: Payer: PRIVATE HEALTH INSURANCE

## 2022-03-23 MED ORDER — albuterol (PROAIR HFA) 90 mcg/actuation Inhl inhaler
90 | Freq: Four times a day (QID) | RESPIRATORY_TRACT | 11 refills | Status: AC | PRN
Start: 2022-03-23 — End: ?

## 2022-03-24 ENCOUNTER — Ambulatory Visit: Payer: PRIVATE HEALTH INSURANCE

## 2022-03-31 ENCOUNTER — Institutional Professional Consult (permissible substitution)
Admit: 2022-03-31 | Discharge: 2022-03-31 | Payer: PRIVATE HEALTH INSURANCE | Attending: Clinical & Laboratory Immunology

## 2022-03-31 DIAGNOSIS — J3089 Other allergic rhinitis: Secondary | ICD-10-CM

## 2022-03-31 NOTE — Unmapped (Signed)
Patient hasn't had any reactions from their allergy injections. Doing well with their Immunotherapy. Mold and pollens avoidence guidelines were discussed. Patient was advised to continue their nasal rinsing and stay inside when mold and pollen counts were high. Patient was shone their allergy vials to verify their name and date of birth.

## 2022-04-07 ENCOUNTER — Ambulatory Visit: Payer: PRIVATE HEALTH INSURANCE

## 2022-04-08 ENCOUNTER — Institutional Professional Consult (permissible substitution)
Admit: 2022-04-08 | Discharge: 2022-04-13 | Payer: PRIVATE HEALTH INSURANCE | Attending: Clinical & Laboratory Immunology

## 2022-04-08 ENCOUNTER — Ambulatory Visit: Admit: 2022-04-08 | Discharge: 2022-04-08 | Payer: PRIVATE HEALTH INSURANCE

## 2022-04-08 DIAGNOSIS — Z48813 Encounter for surgical aftercare following surgery on the respiratory system: Secondary | ICD-10-CM

## 2022-04-08 DIAGNOSIS — J3089 Other allergic rhinitis: Secondary | ICD-10-CM

## 2022-04-08 NOTE — Unmapped (Signed)
Patient hasn't had any reactions from their allergy injections. Doing well with their Immunotherapy. Mold and pollens avoidence guidelines were discussed. Patient was advised to continue their nasal rinsing and stay inside when mold and pollen counts were high. Patient was shone their allergy vials to verify their name and date of birth.

## 2022-04-08 NOTE — Unmapped (Signed)
[facility]  Department of Otolaryngology--Head and Neck Surgery  Division of Rhinology, Allergy, and Anterior Skull Base Surgery  Blair Heys, MD, PhD, [facility] Building  [address] [place], [place] [address]  Telephone: [phone]        Pt name: Mary [name]  MRN: [id number]      History Present Illness on 04/08/2022  This is a 51 y.o. 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  She underwent bilateral maxillary antrostomies, septoplasty and inferior turbinate reductions with me on Feb 03, 2022.  Postoperatively, I have had her use twice daily budesonide rinses and she has also resumed subcutaneous immunotherapy.    Overall she's doing well.        Radiology Findings:   No results found for the past 12 months        Physical Exam:  General Appearance: well appearing, in no apparent distress  Anterior Rhinoscopy: Septum is normal.  Inferior turbinates are hypertrophied.  Other: None.     Procedure: Because of the patients history and symptomatology, I performed bilateral nasal endoscopy after obtaining verbal consent from the patient.  On inspection of the interior of the nasal cavity, I examined the inferior turbinates, middle turbinates, middle meatuses, superior meatuses and sphenoethmoid recesses.  Visualization of the superior meatuses and sphenoethmoid recesses was obscured by the middle turbinate and the septum.  All structures appeared normal/unremarkable except as noted below:    Widely patent maxillary antrostomies bilaterally    Widely patent nasal passages      Impression:  Chronic rhinosinusitis  Environmental allergies  S/p bilateral maxillary antrostomies, septoplasty and inferior turbinate reductions on Feb 03, 2022      Plan:  Okay to stop the twice daily budesonide rinses given how good her nose looks.    Try Nasacort and Azelastine, each 1 spray in each side twice daily.  If her symptoms flareup we can escalate  to 2 sprays of each in each side twice daily; or back to budesonide rinses.    Follow-up in 1 year, or sooner if needed.        Number and Complexity of Problems (Level 4)  During this encounter, I addressed 2 or more stable chronic illnesses    Risk of Complication and/or Morbidity or Mortality of Patient Management (Level  4)  The patient's management entails Moderate risk of complications and/or morbidity or mortality.          Attestation: in the case of resident participation in the care of this patient, I attest that I have seen and examined the patient, formulated the plan and agree with the documentation.      Blair Heys, MD, PhD, FACS  Director, Division of Rhinology, Allergy, and Anterior Skull Base Surgery  Department of Otolaryngology--Head and Neck Surgery  [facility]  [place] [facility]    CC:   Cleone Slim, MD  No ref. provider found    **This note was dictated with voice-recognition software**

## 2022-04-14 ENCOUNTER — Ambulatory Visit: Payer: PRIVATE HEALTH INSURANCE

## 2022-04-19 ENCOUNTER — Institutional Professional Consult (permissible substitution): Admit: 2022-04-19 | Payer: PRIVATE HEALTH INSURANCE | Attending: Clinical & Laboratory Immunology

## 2022-04-19 DIAGNOSIS — J3089 Other allergic rhinitis: Secondary | ICD-10-CM

## 2022-04-19 NOTE — Unmapped (Signed)
Patient hasn't had any reactions from their allergy injections. Doing well with their Immunotherapy. Mold and pollens avoidence guidelines were discussed. Patient was advised to continue their nasal rinsing and stay inside when mold and pollen counts were high. Patient was shone their allergy vials to verify their name and date of birth.

## 2022-04-21 ENCOUNTER — Ambulatory Visit: Payer: PRIVATE HEALTH INSURANCE

## 2022-04-23 ENCOUNTER — Institutional Professional Consult (permissible substitution): Admit: 2022-04-23 | Payer: PRIVATE HEALTH INSURANCE

## 2022-04-23 DIAGNOSIS — J3089 Other allergic rhinitis: Secondary | ICD-10-CM

## 2022-04-23 NOTE — Unmapped (Signed)
Extract #1: Escalating: DM, CT, DG, M  Dose: 0.4 ml  Site: right arm  Administered by: G.Janssen Zee RN    Extract #2: Escalating: TR, GR, W  Dose: 0.35 ml  Site: left arm  Administered by: G.Bynum Mccullars RN    Noted a large local reaction from extract #2.

## 2022-04-27 NOTE — Unmapped (Signed)
LMTCB to schedule appointment

## 2022-04-28 ENCOUNTER — Institutional Professional Consult (permissible substitution): Admit: 2022-04-28 | Payer: PRIVATE HEALTH INSURANCE

## 2022-04-28 DIAGNOSIS — J3089 Other allergic rhinitis: Secondary | ICD-10-CM

## 2022-04-28 NOTE — Unmapped (Signed)
Extract #1: Escalating: DM, CT, DG, M  Dose: 0.45 ml  Site: right arm  Administered by: G.Luisana Lutzke RN    Extract #2: Escalating: TR, GR, W  Dose: 0.4 ml  Site: left arm  Administered by: G.Teddie Curd RN    Noted a large local reaction from extract #2.

## 2022-04-29 ENCOUNTER — Ambulatory Visit: Admit: 2022-04-29 | Discharge: 2022-04-29 | Payer: PRIVATE HEALTH INSURANCE

## 2022-04-29 DIAGNOSIS — D899 Disorder involving the immune mechanism, unspecified: Secondary | ICD-10-CM

## 2022-04-29 MED ORDER — budesonide-formoteroL (SYMBICORT) 160-4.5 mcg/actuation inhaler
160-4.5 | Freq: Two times a day (BID) | RESPIRATORY_TRACT | 3 refills | Status: AC
Start: 2022-04-29 — End: ?

## 2022-04-29 MED ORDER — dextroamphetamine-amphetamine (ADDERALL) 20 mg Tab
20 | ORAL_TABLET | Freq: Two times a day (BID) | ORAL | 0 refills | 30.00000 days
Start: 2022-04-29 — End: 2022-06-25

## 2022-04-29 NOTE — Unmapped (Signed)
Encounter Diagnoses   Name Primary?    Attention deficit disorder, unspecified hyperactivity presence  -continue with the current treatment       Moderate persistent asthma without complication  - Prescription(s) were electronically prescribed today      Immune disorder (CMS-HCC)  - Please call to schedule your appointment: I believe the # is [phone]  Yes

## 2022-04-29 NOTE — Unmapped (Signed)
Subjective   HPI:   Patient ID: Mary [name] is a 51 y.o. female.    The following HPI was reviewed and copied forward (with edits) from a note written by me on 10/07/21. I have reviewed and updated the history, physical exam, data, assessment, and plan of the note as detailed below so that it reflects my evaluation and management of the patient.       The patient's chart was reviewed prior to the visit as part of pre-visit planning  No chief complaint on file.       ADD   Past: AXR 40mg  once daily, b/o supply issues past few months has been on SA form 20mg  bid (am and pm), concentration is doing ok (past did better w 45mg  but $$$ issues)    Is keen to reduce dose and eventually dc   Denies any s/e   Denies diversion or st/drug abuse   Has rec'ed CTR for MM, looking into edible          06/25/2020     2:20 PM 09/30/2020     1:12 PM 01/20/2021     2:32 PM 04/22/2021     2:46 PM 10/27/2021     1:42 PM 01/28/2022     4:21 PM 04/29/2022     1:53 PM   Controlled Substance Monitoring   OARRS/eKASPER Status Reviewed Reviewed Reviewed Reviewed Reviewed Reviewed Reviewed   OARRS/eKASPER Consistent with Prescriber Expectation [name]     I have completed the required OARRS/eKASPER documentation for this patient on 04/29/2022.  Sheikh Leverich [name]      Past Medical History:   Diagnosis Date    ADD (attention deficit disorder)     Allergy     Anemia     Anxiety     Asthma     Cancer (CMS-HCC)     Cervical dysplasia     Chronic back pain     Depression     Diverticulitis     Environmental allergies     Iron deficiency     Varicella     Vitamin D deficiency        Current Outpatient Medications   Medication Sig Dispense Refill    budesonide-formoteroL (SYMBICORT) 160-4.5 mcg/actuation inhaler Inhale 1 puff into the lungs 2 times a day. 10.2 g 3    dextroamphetamine-amphetamine (ADDERALL) 20 mg Tab Take 1 tablet (20 mg total) by mouth in the morning and at bedtime for 30 days. 60 tablet 0    [START ON 05/29/2022]  dextroamphetamine-amphetamine (ADDERALL) 20 mg Tab Take 1 tablet (20 mg total) by mouth in the morning and at bedtime for 30 days. 60 tablet 0    [START ON 06/28/2022] dextroamphetamine-amphetamine (ADDERALL) 20 mg Tab Take 1 tablet (20 mg total) by mouth in the morning and at bedtime for 30 days. 60 tablet 0    acetaminophen (TYLENOL) 500 MG tablet Take 2 tablets (1,000 mg total) by mouth every 8 hours. 60 tablet 1    albuterol (PROAIR HFA) 90 mcg/actuation Inhl inhaler Inhale 2 puffs into the lungs every 6 hours as needed for Wheezing. 8 g 11    azelastine (ASTELIN) 137 mcg (0.1 %) nasal spray Use 1 spray into each nostril 2 times a day. Use in each nostril as directed 30 mL 1    cetirizine (ZYRTEC) 10 MG tablet Take 1 tablet (10 mg total) by mouth daily as needed for Allergies.  cholecalciferol, vitamin D3, 125 mcg (5,000 unit) Tab Take by mouth.      escitalopram oxalate (LEXAPRO) 20 MG tablet Take 1 tablet (20 mg total) by mouth daily. 90 tablet 3    FERROUS SULFATE (IRON ORAL) Take by mouth.      fluticasone (FLONASE) 50 mcg/actuation nasal spray SHAKE LIQUID AND USE 1 TO 2 SPRAYS IN EACH NOSTRIL DAILY AS NEEDED 16 g 0    montelukast (SINGULAIR) 10 mg tablet Take 1 tablet (10 mg total) by mouth at bedtime. 90 tablet 3    predniSONE (DELTASONE) 20 MG tablet 1 by mouth twice daily x 3 days then 1 by mouth once daily x 4 days (Patient taking differently: 1 by mouth twice daily x 3 days then 1 by mouth once daily x 4 days  As needed) 10 tablet 0    senna-docusate (SENNA-S) 8.6-50 mg per tablet Take 1 tablet by mouth every 12 hours as needed for Constipation. 20 tablet 0     No current facility-administered medications for this visit.          ROS:   Review of Systems  as above   Objective:   Physical Exam      Wt Readings from Last 3 Encounters:   04/08/22 165 lb (74.8 kg)   02/03/22 165 lb (74.8 kg)   01/22/22 165 lb (74.8 kg)       There were no vitals filed for this visit.  There is no height or weight on  file to calculate BMI.  There is no height or weight on file to calculate BSA.    NAD  Psych: A&Ox3; reactive affect; no obv thought d/o       Lab Results   Component Value Date    WBC 4.5 01/22/2022    HGB 13.2 01/22/2022    HCT 39.6 01/22/2022    MCV 88.8 01/22/2022    PLT 262 01/22/2022       Lab Results   Component Value Date    HGBA1C 5.9 (H) 10/01/2020     No components found for: GLUF,  MICROALBUR,  LDLCALC,  CREATININE    Lab Results   Component Value Date    GLUCOSE 86 01/22/2022       No results found for: [facility]    Lab Results   Component Value Date    TSH 1.79 10/01/2020     Lab Results   Component Value Date    VITAMINB12 >1506 (H) 10/01/2020          Upcoming Health Maintenance       Hepatitis C Screening (MyChart) (Once)  Ordered on 10/27/2021    Immunization: Hepatitis B (1 of 3 - 3-dose series)  Overdue - never done    Comprehensive Physical Exam (Yearly)  Overdue - never done    HIV Screening (Once)  Ordered on 10/27/2021    Immunization: DTaP/Tdap/Td (1 - Tdap)  Overdue - never done    Immunization: Pneumococcal (2 - PCV)  Overdue since 10/04/2009    Immunization: Zoster (1 of 2)  Overdue - never done    Cervical Cancer Screening/Pap Smear (MyChart) (Every 3 Years)  Overdue since 05/13/2020    Depression Monitoring (PHQ-9) (Every 3 Months)  Overdue since 04/30/2021    Immunization: Influenza (MyChart) (1)  Next due on 06/04/2022    Mammogram (MyChart) (Every 2 Years)  Next due on 08/20/2022    Alcohol Misuse Screening (Yearly)  Next due on 10/27/2022    Colorectal  Cancer Screening (MyChart) (Cologuard (FIT-DNA) - Every 3 Years)  Next due on 12/04/2022    Diabetes Screening (Every 3 Years)  Ordered on 10/27/2021    Lipid Panel (Every 5 Years)  Ordered on 10/27/2021           Immunization History   Administered Date(s) Administered    COVID-19, mRNA, Moderna monovalent, age 61+ 04/20/2021, 05/18/2021    Influenza, quadrivalent, intradermal, preservative-free 07/04/2017    Influenza, quadrivalent,  preservative-free 08/06/2015, 06/25/2016, 09/11/2018, 09/30/2020    Influenza, unspecified 05/31/2008    Pneumococcal polysaccharide, 23-valent 10/04/2008         Assessment/Plan:     TI: 150  TO: 205    This was a Video visit, including two-way audio and video communication, in lieu of an in-person visit. The patient provided verbal consent to participate in the telehealth visit.   I spent 12 minutes speaking with the patient, conducting an interview, performing a limited exam, and educating the patient on my assessment and plan. I also spent 3 minutes, on the same day as the encounter, preparing to see the patient (eg, review of tests), ordering medications, tests, or procedures, documenting clinical information in the electronic or other health record, and performing non-face-to-face activities.        A/P:  Patient Instructions     Encounter Diagnoses   Name Primary?    Attention deficit disorder, unspecified hyperactivity presence  -continue with the current treatment       Moderate persistent asthma without complication  - Prescription(s) were electronically prescribed today      Immune disorder (CMS-HCC)  - Please call to schedule your appointment: I believe the # is [phone]  Yes          RTC: 3 months    New meds:   none  Patient education given  none  Patient verbalized understanding & agreement of the proposed plan.

## 2022-05-05 ENCOUNTER — Ambulatory Visit: Payer: PRIVATE HEALTH INSURANCE

## 2022-05-10 MED ORDER — escitalopram oxalate (LEXAPRO) 20 MG tablet
20 | ORAL_TABLET | Freq: Every day | ORAL | 3 refills | Status: AC
Start: 2022-05-10 — End: ?

## 2022-05-12 ENCOUNTER — Ambulatory Visit: Payer: PRIVATE HEALTH INSURANCE

## 2022-05-13 ENCOUNTER — Institutional Professional Consult (permissible substitution): Admit: 2022-05-13 | Payer: PRIVATE HEALTH INSURANCE

## 2022-05-13 DIAGNOSIS — J3089 Other allergic rhinitis: Secondary | ICD-10-CM

## 2022-05-13 NOTE — Unmapped (Signed)
Extract #1: Escalating: DM, CT, DG, M  Dose: 0.45 ml  Site: right arm  Administered by: G.Hansel Devan RN    Extract #2: Escalating: TR, GR, W  Dose: 0.4 ml  Site: left arm  Administered by: G.Trinady Milewski RN    Missed injection appointment.

## 2022-05-19 ENCOUNTER — Institutional Professional Consult (permissible substitution): Admit: 2022-05-19 | Payer: PRIVATE HEALTH INSURANCE

## 2022-05-19 DIAGNOSIS — J3089 Other allergic rhinitis: Secondary | ICD-10-CM

## 2022-05-19 NOTE — Unmapped (Signed)
Extract #1: Escalating: DM, CT, DG, M  Dose: 0.5 ml  Site: right arm  Administered by: G.Kinaya Hilliker RN    Extract #2: Escalating: TR, GR, W  Dose: 0.45 ml  Site: left arm  Administered by: G.Floyd Lusignan RN    Missed injection appointment.

## 2022-05-26 ENCOUNTER — Ambulatory Visit: Payer: PRIVATE HEALTH INSURANCE

## 2022-05-31 ENCOUNTER — Ambulatory Visit: Payer: PRIVATE HEALTH INSURANCE

## 2022-06-02 ENCOUNTER — Ambulatory Visit: Payer: PRIVATE HEALTH INSURANCE

## 2022-06-04 ENCOUNTER — Institutional Professional Consult (permissible substitution): Admit: 2022-06-04 | Payer: PRIVATE HEALTH INSURANCE | Attending: Clinical & Laboratory Immunology

## 2022-06-04 DIAGNOSIS — J3089 Other allergic rhinitis: Secondary | ICD-10-CM

## 2022-06-04 NOTE — Unmapped (Signed)
[facility]  ALLERGY TREATMENT VIAL WORKSHEET ENVIRONMENTAL  NAME: Mary [name], Mary [name] DOB: 06/08/1971 DR. SEDAGHAT DATE: 06/04/2022  EXTRACT#1 START DATE: 11/2020  DUST MITES: End Point: Vial Dilution: Volume: ml   D. Farinae 4 Concentrate 0.2   D. Pteronyssinus 4 Concentrate 0.2   ANIMALS:      Cockroach 3 Not treated ---   Cat 5 Concentrate 0.2   Dog 3 Concentrate 0.2   MOLDS      *Alternaria 4 Concentrate 0.2   Aspergillus 6 1 0.2   Helminthosporium 3 Not treated ---   Hormodendrum 3 Not treated ---   Candida Albicans 3 Not treated ---   Penicillium notatum 3 Not treated ---   Fusarium Oxysporium 5 Concentrate 0.2   Epicoccum 5 Concentrate 0.2   Pullularia 4 Concentrate 0.2   Rhizopus 3 Not treated ---   Total Volume of Antigens: 1.59ml   Total Volume of Diluent: 3.17ml   Total Volume:   5ml   EXTRACT#2 START DATE: 11/2020  TREES      Cedar 5 Concentrate 0.2   Birch 6 1 0.2   Ash 5 Not treated ---   Pecan/ Hickory 5 Concentrate 0.2   Elm 6 1 0.2   Box Elder 3 Not treated ---   Oak 5 Concentrate 0.2   Cottonwood 4 Not treated ---   GRASSES      Johnson Grass 5 Not treated ---   Marcial Pacas Grass 5 Concentrate 0.2   [place] Grass 4 Concentrate 0.2   WEEDS      English Plantain 4 Concentrate 0.2   Ragweed 6 1 0.2   Lambs quarter 3 Not treated ---   Pigweed 3 Not treated ---   Sorrel 3 Not treated ---   Wormwood 5 Concentrate 0.2   Total Volume of Antigens:    2ml   Total Volume of Diluent:    3ml   Total Volume:    5ml   Prepared by: G.Bary Limbach BSN RN 06/04/2022   Charges generated on paper invoice.Extract lot numbers [id number]    D.FarinaeMarland Kitchen (16) [id number] EXP: 10/09/2025  D.Pteronyssinus: (10) [id number] EXP: 10/16/2025    Cat: [id number] EXP: 11/30/2023  Dog Hair: [id number] EXP: 01/10/2025    Alternaria: [id number] EXP: 09/05/2024  Aspergillus: [id number] EXP: 01/10/2025  Fusarium: [id number] EXP: 12/31/2023  Epiccocum: [id number] EXP: 10/05/2024  Pullaria: [id number] EXP: 12/31/2023    Cedar: [id number] EXP: 09/05/2024  Charletta Cousin, River:  [id number] EXP: 02/27/2025  Pecan/Hickory: [id number] EXP: 02/27/2025  Elm, American: [id number] EXP: 06/10/2023  Lafe Garin Red: [id number] EXP: 02/27/2025    English Plantain: [id number] EXP: 01/10/2025  Ragweed Short/Giant: [id number] EXP: 07/02/2027  Wormwood: [id number] EXP: 11/08/2024    Juliann Mule: [id number] EXP: 11/30/2023  [place] Grass: (10) [id number] EXP: 04/25/2024

## 2022-06-04 NOTE — Unmapped (Signed)
Patient hasn't had any reactions from their allergy injections. Doing well with their Immunotherapy. Mold and pollens avoidence guidelines were discussed. Patient was advised to continue their nasal rinsing and stay inside when mold and pollen counts were high. Patient was shone their allergy vials to verify their name and date of birth.

## 2022-06-09 ENCOUNTER — Institutional Professional Consult (permissible substitution): Admit: 2022-06-09 | Payer: PRIVATE HEALTH INSURANCE

## 2022-06-09 DIAGNOSIS — J3089 Other allergic rhinitis: Secondary | ICD-10-CM

## 2022-06-09 NOTE — Unmapped (Signed)
Extract #1: Escalating: DM, CT, DG, M  Dose: 0.03 ml  Site: right arm  Administered by: G.Chyanna Flock, BSN, RN    Extract #2: Escalating: TR, GR, W  Dose: 0.03 ml  Site: left arm  Administered by: G.Allisson Schindel, BSN, RN    No noted reactions from the previous injection. The patient denies any fever or any other symptoms from the last 24 hours.     Vial test: Extract #1 and #2: 9 mm wheal.

## 2022-06-16 ENCOUNTER — Institutional Professional Consult (permissible substitution): Admit: 2022-06-16 | Discharge: 2022-06-16 | Payer: PRIVATE HEALTH INSURANCE

## 2022-06-16 DIAGNOSIS — J3089 Other allergic rhinitis: Secondary | ICD-10-CM

## 2022-06-16 NOTE — Unmapped (Signed)
Extract #1: Escalating: DM, CT, DG, M  Dose: 0.05 ml  Site: right arm  Administered by: G.Debbi Strandberg, BSN, RN    Extract #2: Escalating: TR, GR, W  Dose: 0.05 ml  Site: left arm  Administered by: G.Aarika Moon, BSN, RN    No noted reactions from the previous injection. The patient denies any fever or any other symptoms from the last 24 hours.

## 2022-06-23 ENCOUNTER — Ambulatory Visit: Payer: PRIVATE HEALTH INSURANCE

## 2022-06-25 ENCOUNTER — Ambulatory Visit: Admit: 2022-06-25 | Payer: PRIVATE HEALTH INSURANCE

## 2022-06-25 DIAGNOSIS — F988 Other specified behavioral and emotional disorders with onset usually occurring in childhood and adolescence: Secondary | ICD-10-CM

## 2022-06-25 MED ORDER — dextroamphetamine-amphetamine (ADDERALL) 20 mg Tab
20 | ORAL_TABLET | Freq: Two times a day (BID) | ORAL | 0 refills | Status: AC
Start: 2022-06-25 — End: 2022-08-24

## 2022-06-25 MED ORDER — dextroamphetamine-amphetamine (ADDERALL) 20 mg Tab
20 | ORAL_TABLET | Freq: Two times a day (BID) | ORAL | 0 refills | Status: AC
Start: 2022-06-25 — End: 2022-07-25

## 2022-06-25 MED ORDER — dextroamphetamine-amphetamine (ADDERALL) 20 mg Tab
20 | ORAL_TABLET | Freq: Two times a day (BID) | ORAL | 0 refills | Status: AC
Start: 2022-06-25 — End: 2022-09-23

## 2022-06-25 NOTE — Unmapped (Signed)
Encounter Diagnoses   Name Primary?    Attention deficit disorder, unspecified hyperactivity presence  -continue with the current treatment

## 2022-06-25 NOTE — Unmapped (Signed)
Will see if she can do a phone visit this afternoon

## 2022-06-25 NOTE — Unmapped (Signed)
There are no appointments available before patients Adderall runs out.  Is there a time you can fit her in or will you be willing to give her a script to bridge the gap?  She did schedule her appointment for the next 3 month visit in December.    She will be out 9/28.    Please advise

## 2022-06-25 NOTE — Unmapped (Signed)
Subjective   HPI:   Patient ID: Mary [name] is a 51 y.o. female.    The following HPI was reviewed and copied forward (with edits) from a note written by me on 04/29/22. I have reviewed and updated the history, physical exam, data, assessment, and plan of the note as detailed below so that it reflects my evaluation and management of the patient.       The patient's chart was reviewed prior to the visit as part of pre-visit planning  No chief complaint on file.       ADD   Past: AXR 40mg  once daily, b/o supply issues past several months has been on SA form 20mg  bid (am and pm), concentration is doing ok (past did better w 45mg  but $$$ issues)    Is keen to reduce dose and eventually dc   Denies any s/e   Denies diversion or st/drug abuse   Has rec'ed CTR for MM, I have only gone once, using for sleep 2x/week: vape works better than the edible altho it does impact her asthma a little          09/30/2020     1:12 PM 01/20/2021     2:32 PM 04/22/2021     2:46 PM 10/27/2021     1:42 PM 01/28/2022     4:21 PM 04/29/2022     1:53 PM 06/25/2022     4:05 PM   Controlled Substance Monitoring   OARRS/eKASPER Status Reviewed Reviewed Reviewed Reviewed Reviewed Reviewed Reviewed   OARRS/eKASPER Consistent with Prescriber Expectation [name]     I have completed the required OARRS/eKASPER documentation for this patient on 06/25/2022.  Rayansh Herbst [name]      Past Medical History:   Diagnosis Date    ADD (attention deficit disorder)     Allergy     Anemia     Anxiety     Asthma     Cancer (CMS-HCC)     Cervical dysplasia     Chronic back pain     Depression     Diverticulitis     Environmental allergies     Iron deficiency     Varicella     Vitamin D deficiency        Current Outpatient Medications   Medication Sig Dispense Refill    dextroamphetamine-amphetamine (ADDERALL) 20 mg Tab Take 1 tablet (20 mg total) by mouth in the morning and at bedtime for 30 days. 60 tablet 0    [START ON 07/25/2022] dextroamphetamine-amphetamine  (ADDERALL) 20 mg Tab Take 1 tablet (20 mg total) by mouth in the morning and at bedtime for 30 days. 60 tablet 0    [START ON 08/24/2022] dextroamphetamine-amphetamine (ADDERALL) 20 mg Tab Take 1 tablet (20 mg total) by mouth in the morning and at bedtime for 30 days. 60 tablet 0    acetaminophen (TYLENOL) 500 MG tablet Take 2 tablets (1,000 mg total) by mouth every 8 hours. 60 tablet 1    albuterol (PROAIR HFA) 90 mcg/actuation Inhl inhaler Inhale 2 puffs into the lungs every 6 hours as needed for Wheezing. 8 g 11    azelastine (ASTELIN) 137 mcg (0.1 %) nasal spray Use 1 spray into each nostril 2 times a day. Use in each nostril as directed 30 mL 1    budesonide-formoteroL (SYMBICORT) 160-4.5 mcg/actuation inhaler Inhale 1 puff into the lungs 2 times a day. 10.2 g 3    cetirizine (ZYRTEC) 10  MG tablet Take 1 tablet (10 mg total) by mouth daily as needed for Allergies.      cholecalciferol, vitamin D3, 125 mcg (5,000 unit) Tab Take by mouth.      escitalopram oxalate (LEXAPRO) 20 MG tablet Take 1 tablet (20 mg total) by mouth daily. 90 tablet 3    FERROUS SULFATE (IRON ORAL) Take by mouth.      fluticasone (FLONASE) 50 mcg/actuation nasal spray SHAKE LIQUID AND USE 1 TO 2 SPRAYS IN EACH NOSTRIL DAILY AS NEEDED 16 g 0    montelukast (SINGULAIR) 10 mg tablet Take 1 tablet (10 mg total) by mouth at bedtime. 90 tablet 3    predniSONE (DELTASONE) 20 MG tablet 1 by mouth twice daily x 3 days then 1 by mouth once daily x 4 days (Patient taking differently: 1 by mouth twice daily x 3 days then 1 by mouth once daily x 4 days  As needed) 10 tablet 0    senna-docusate (SENNA-S) 8.6-50 mg per tablet Take 1 tablet by mouth every 12 hours as needed for Constipation. 20 tablet 0     No current facility-administered medications for this visit.          ROS:   Review of Systems  as above   Objective:   Physical Exam      Wt Readings from Last 3 Encounters:   04/08/22 165 lb (74.8 kg)   02/03/22 165 lb (74.8 kg)   01/22/22 165 lb  (74.8 kg)       There were no vitals filed for this visit.  There is no height or weight on file to calculate BMI.  There is no height or weight on file to calculate BSA.    NAD  Psych: A&Ox3; reactive affect; no obv thought d/o       Lab Results   Component Value Date    WBC 4.5 01/22/2022    HGB 13.2 01/22/2022    HCT 39.6 01/22/2022    MCV 88.8 01/22/2022    PLT 262 01/22/2022       Lab Results   Component Value Date    HGBA1C 5.9 (H) 10/01/2020     No components found for: GLUF,  MICROALBUR,  LDLCALC,  CREATININE    Lab Results   Component Value Date    GLUCOSE 86 01/22/2022       No results found for: [facility]    Lab Results   Component Value Date    TSH 1.79 10/01/2020     Lab Results   Component Value Date    VITAMINB12 >1506 (H) 10/01/2020          Upcoming Health Maintenance       Hepatitis C Screening (MyChart) (Once)  Ordered on 10/27/2021    Immunization: Hepatitis B (1 of 3 - 3-dose series)  Overdue - never done    Comprehensive Physical Exam (Yearly)  Overdue - never done    HIV Screening (Once)  Ordered on 10/27/2021    Immunization: DTaP/Tdap/Td (1 - Tdap)  Overdue - never done    Immunization: Pneumococcal (2 - PCV)  Overdue since 10/04/2009    Immunization: Zoster (1 of 2)  Overdue - never done    Cervical Cancer Screening/Pap Smear (MyChart) (Every 3 Years)  Overdue since 05/13/2020    Depression Monitoring (PHQ-9) (Every 3 Months)  Overdue since 04/30/2021    Immunization: Influenza (MyChart) (1)  Due since 06/04/2022    Mammogram (MyChart) (Every 2 Years)  Next  due on 08/20/2022    Alcohol Misuse Screening (Yearly)  Next due on 10/27/2022    Colorectal Cancer Screening (MyChart) (Cologuard (FIT-DNA) - Every 3 Years)  Next due on 12/04/2022    Diabetes Screening (Every 3 Years)  Ordered on 10/27/2021    Lipid Panel (Every 5 Years)  Ordered on 10/27/2021           Immunization History   Administered Date(s) Administered    COVID-19, mRNA, Moderna monovalent, age 48+ 04/20/2021, 05/18/2021    Influenza,  quadrivalent, intradermal, preservative-free 07/04/2017    Influenza, quadrivalent, preservative-free 08/06/2015, 06/25/2016, 09/11/2018, 09/30/2020    Influenza, unspecified 05/31/2008    Pneumococcal polysaccharide, 23-valent 10/04/2008         Assessment/Plan:     TI: 403  TO: 414    This was a Phone conversation, in lieu of an in-person visit. The patient provided verbal consent to participate in the telehealth visit.   I spent 7 minutes speaking with the patient, conducting an interview, performing a limited exam, and educating the patient on my assessment and plan. I also spent 4 minutes, on the same day as the encounter, preparing to see the patient (eg, review of tests), ordering medications, tests, or procedures, documenting clinical information in the electronic or other health record, and performing non-face-to-face activities.     A/P:  Patient Instructions     Encounter Diagnoses   Name Primary?    Attention deficit disorder, unspecified hyperactivity presence  -continue with the current treatment                       RTC: 3 months    New meds:   none  Patient education given  none  Patient verbalized understanding & agreement of the proposed plan.

## 2022-06-30 ENCOUNTER — Ambulatory Visit: Payer: PRIVATE HEALTH INSURANCE

## 2022-07-07 ENCOUNTER — Ambulatory Visit: Payer: PRIVATE HEALTH INSURANCE

## 2022-07-14 ENCOUNTER — Ambulatory Visit: Payer: PRIVATE HEALTH INSURANCE

## 2022-07-21 ENCOUNTER — Institutional Professional Consult (permissible substitution): Admit: 2022-07-21 | Payer: PRIVATE HEALTH INSURANCE

## 2022-07-21 DIAGNOSIS — J3089 Other allergic rhinitis: Secondary | ICD-10-CM

## 2022-07-21 NOTE — Unmapped (Signed)
Extract #1: Escalating: DM, CT, DG, M  Dose: 0.05 ml  Site: right arm  Administered by: G.Omer Puccinelli, BSN, RN    Extract #2: Escalating: TR, GR, W  Dose: 0.05 ml  Site: left arm  Administered by: G.Ardell Aaronson, BSN, RN    No noted reactions from the previous injection. The patient denies any fever or any other symptoms from the last 24 hours.

## 2022-07-28 ENCOUNTER — Institutional Professional Consult (permissible substitution): Admit: 2022-07-28 | Payer: PRIVATE HEALTH INSURANCE | Attending: Clinical & Laboratory Immunology

## 2022-07-28 DIAGNOSIS — J3089 Other allergic rhinitis: Secondary | ICD-10-CM

## 2022-07-28 NOTE — Unmapped (Signed)
Patient hasn't had any reactions from their allergy injections. Doing well with their Immunotherapy. Mold and pollens avoidence guidelines were discussed. Patient was advised to continue their nasal rinsing and stay inside when mold and pollen counts were high. Patient was shone their allergy vials to verify their name and date of birth.

## 2022-08-01 IMAGING — DX DG CHEST 1V PORT
1 series · 1 of 1 positions shown · non-contrast
Comparison: None.

CLINICAL DATA: Dyspnea

EXAM:
PORTABLE CHEST 1 VIEW

[chest]
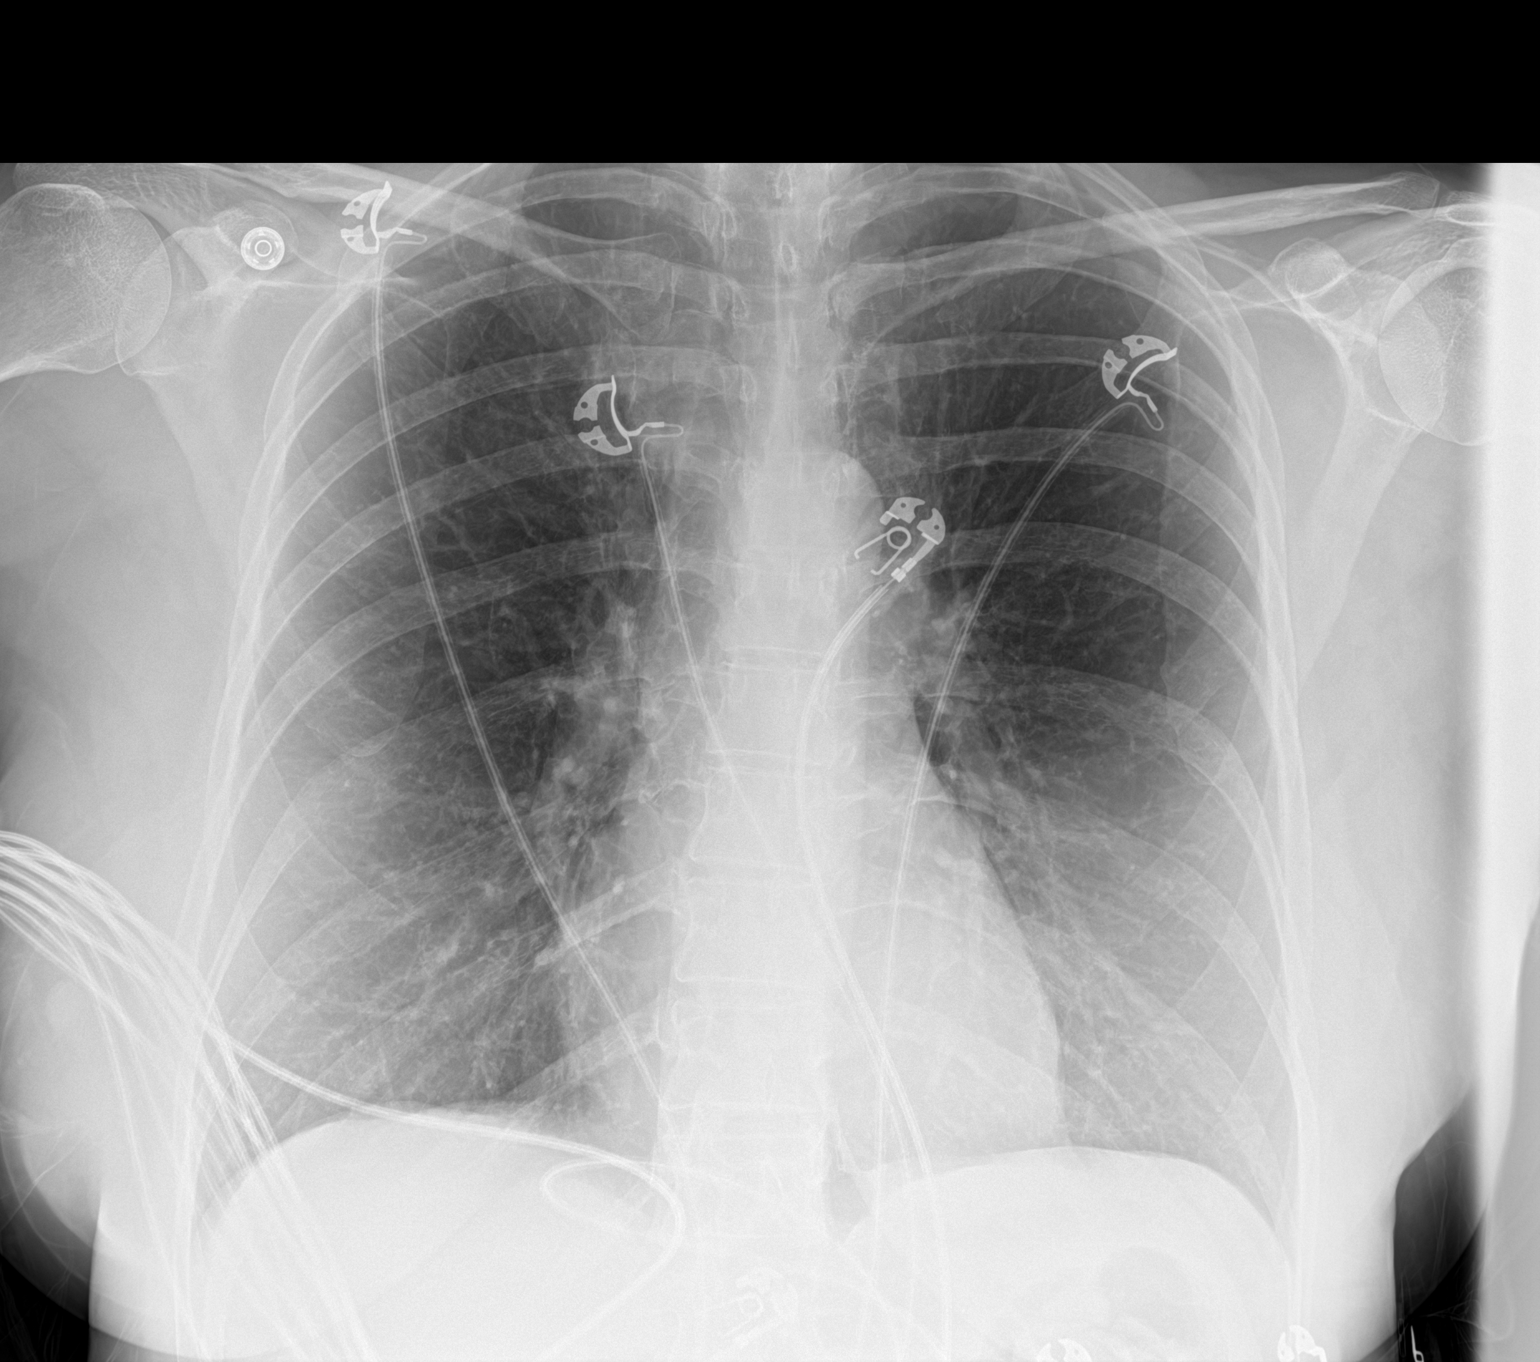

[1 of 1 positions shown; findings below may reference images not displayed]

FINDINGS: The heart size and mediastinal contours are within normal limits.
Both lungs are clear. The visualized skeletal structures are
unremarkable.
IMPRESSION: No active disease.

## 2022-08-04 ENCOUNTER — Institutional Professional Consult (permissible substitution): Admit: 2022-08-04 | Discharge: 2022-08-04 | Payer: PRIVATE HEALTH INSURANCE

## 2022-08-04 DIAGNOSIS — J3089 Other allergic rhinitis: Secondary | ICD-10-CM

## 2022-08-04 NOTE — Unmapped (Signed)
Extract #1: Escalating: DM, CT, DG, M  Dose: 0.15 ml  Site: right arm  Administered by: A. Adama Ferber, LPN   Extract #2: Escalating: TR, GR, W  Dose: 0.15 ml  Site: left arm  Administered by: A. Jnyah Brazee, LPN    No noted reactions from the previous injection. The patient denies any fever or any other symptoms from the last 24 hours.

## 2022-08-11 ENCOUNTER — Institutional Professional Consult (permissible substitution): Admit: 2022-08-11 | Discharge: 2022-08-11 | Payer: PRIVATE HEALTH INSURANCE

## 2022-08-11 DIAGNOSIS — J3089 Other allergic rhinitis: Secondary | ICD-10-CM

## 2022-08-11 NOTE — Unmapped (Signed)
Extract #1: Escalating: DM, CT, DG, M  Dose: 0.2 ml  Site: right arm  Administered by: G.Celeste Candelas, BSN, RN    Extract #2: Escalating: TR, GR, W  Dose: 0.2 ml  Site: left arm  Administered by: G.Jesika Men, BSN, RN    No noted reactions from the previous injection. The patient denies any fever or any other symptoms from the last 24 hours.

## 2022-08-18 ENCOUNTER — Institutional Professional Consult (permissible substitution): Admit: 2022-08-18 | Payer: PRIVATE HEALTH INSURANCE

## 2022-08-18 DIAGNOSIS — J3089 Other allergic rhinitis: Secondary | ICD-10-CM

## 2022-08-18 NOTE — Unmapped (Signed)
Extract #1: Escalating: DM, CT, DG, M  Dose: 0.25 ml  Site: right arm  Administered by: G.Falesha Schommer, BSN, RN    Extract #2: Escalating: TR, GR, W  Dose: 0.25 ml  Site: left arm  Administered by: G.Deshon Koslowski, BSN, RN    No noted reactions from the previous injection. The patient denies any fever or any other symptoms from the last 24 hours.

## 2022-08-23 ENCOUNTER — Ambulatory Visit: Payer: PRIVATE HEALTH INSURANCE

## 2022-08-25 ENCOUNTER — Institutional Professional Consult (permissible substitution): Admit: 2022-08-25 | Discharge: 2022-08-25 | Payer: PRIVATE HEALTH INSURANCE

## 2022-08-25 DIAGNOSIS — J3089 Other allergic rhinitis: Secondary | ICD-10-CM

## 2022-08-25 NOTE — Unmapped (Signed)
Extract #1: Escalating: DM, CT, DG, M  Dose: 0.3 ml  Site: right arm  Administered by: G.Mayes Sangiovanni, BSN, RN    Extract #2: Escalating: TR, GR, W  Dose: 0.3 ml  Site: left arm  Administered by: G.Chucky Homes, BSN, RN    No noted reactions from the previous injection. The patient denies any fever or any other symptoms from the last 24 hours.

## 2022-08-31 ENCOUNTER — Encounter

## 2022-09-01 ENCOUNTER — Institutional Professional Consult (permissible substitution): Admit: 2022-09-01 | Discharge: 2022-09-01 | Payer: PRIVATE HEALTH INSURANCE

## 2022-09-01 DIAGNOSIS — J3089 Other allergic rhinitis: Secondary | ICD-10-CM

## 2022-09-01 NOTE — Unmapped (Signed)
Extract #1: Escalating: DM, CT, DG, M  Dose: 0.35 ml  Site: right arm  Administered by: G.Zaryia Markel, BSN, RN    Extract #2: Escalating: TR, GR, W  Dose: 0.35 ml  Site: left arm  Administered by: G.Mustafa Potts, BSN, RN    No noted reactions from the previous injection. The patient denies any fever or any other symptoms from the last 24 hours.

## 2022-09-08 ENCOUNTER — Institutional Professional Consult (permissible substitution): Admit: 2022-09-08 | Discharge: 2022-09-08 | Payer: PRIVATE HEALTH INSURANCE

## 2022-09-08 DIAGNOSIS — J3089 Other allergic rhinitis: Secondary | ICD-10-CM

## 2022-09-08 NOTE — Unmapped (Signed)
Extract #1: Escalating: DM, CT, DG, M  Dose: 0.4 ml  Site: right arm  Administered by: G.Malyk Girouard, BSN, RN    Extract #2: Escalating: TR, GR, W  Dose: 0.4 ml  Site: left arm  Administered by: G.Andalyn Heckstall, BSN, RN    No noted reactions from the previous injection. The patient denies any fever or any other symptoms from the last 24 hours.

## 2022-09-15 ENCOUNTER — Ambulatory Visit: Payer: PRIVATE HEALTH INSURANCE

## 2022-09-22 ENCOUNTER — Ambulatory Visit: Payer: PRIVATE HEALTH INSURANCE

## 2022-09-29 ENCOUNTER — Ambulatory Visit: Payer: PRIVATE HEALTH INSURANCE

## 2022-10-08 ENCOUNTER — Ambulatory Visit: Payer: PRIVATE HEALTH INSURANCE

## 2022-10-13 ENCOUNTER — Institutional Professional Consult (permissible substitution): Admit: 2022-10-13 | Payer: PRIVATE HEALTH INSURANCE

## 2022-10-13 DIAGNOSIS — J3089 Other allergic rhinitis: Secondary | ICD-10-CM

## 2022-10-13 NOTE — Unmapped (Signed)
Extract #1: Escalating: DM, CT, DG, M  Dose: 0.35 ml  Site: right arm  Administered by: G.Coni Homesley, BSN, RN    Extract #2: Escalating: TR, GR, W  Dose: 0.35 ml  Site: left arm  Administered by: G.Patric Buckhalter, BSN, RN    No noted reactions from the previous injection. The patient denies any fever or any other symptoms from the last 24 hours.

## 2022-10-20 ENCOUNTER — Ambulatory Visit: Payer: PRIVATE HEALTH INSURANCE

## 2022-10-27 ENCOUNTER — Institutional Professional Consult (permissible substitution)
Admit: 2022-10-27 | Discharge: 2022-10-27 | Payer: PRIVATE HEALTH INSURANCE | Attending: Clinical & Laboratory Immunology

## 2022-10-27 DIAGNOSIS — J3089 Other allergic rhinitis: Secondary | ICD-10-CM

## 2022-10-27 NOTE — Unmapped (Signed)
Patient hasn't had any reactions from their allergy injections. Doing well with their Immunotherapy. Mold and pollens avoidence guidelines were discussed. Patient was advised to continue their nasal rinsing and stay inside when mold and pollen counts were high. Patient was shone their allergy vials to verify their name and date of birth.

## 2022-11-03 ENCOUNTER — Institutional Professional Consult (permissible substitution): Admit: 2022-11-03 | Discharge: 2022-11-03 | Payer: PRIVATE HEALTH INSURANCE

## 2022-11-03 DIAGNOSIS — J3089 Other allergic rhinitis: Secondary | ICD-10-CM

## 2022-11-03 NOTE — Unmapped (Signed)
Extract #1: Escalating: DM, CT, DG, M  Dose: 0.45 ml  Site: right arm  Administered by: G.Venice Marcucci, BSN, RN    Extract #2: Escalating: TR, GR, W  Dose: 0.45 ml  Site: left arm  Administered by: G.Emmily Pellegrin, BSN, RN    No noted reactions from the previous injection. The patient denies any fever or any other symptoms from the last 24 hours.

## 2022-11-10 ENCOUNTER — Institutional Professional Consult (permissible substitution)
Admit: 2022-11-10 | Discharge: 2022-11-10 | Payer: PRIVATE HEALTH INSURANCE | Attending: Clinical & Laboratory Immunology

## 2022-11-10 DIAGNOSIS — J3089 Other allergic rhinitis: Secondary | ICD-10-CM

## 2022-11-10 NOTE — Progress Notes (Signed)
Patient hasn't had any reactions from their allergy injections. Doing well with their Immunotherapy. Mold and pollens avoidence guidelines were discussed. Patient was advised to continue their nasal rinsing and stay inside when mold and pollen counts were high. Patient was shone their allergy vials to verify their name and date of birth.

## 2022-11-17 ENCOUNTER — Ambulatory Visit: Admit: 2022-11-17 | Discharge: 2022-11-29 | Payer: PRIVATE HEALTH INSURANCE

## 2022-11-17 ENCOUNTER — Institutional Professional Consult (permissible substitution): Admit: 2022-11-17 | Discharge: 2022-11-17 | Payer: PRIVATE HEALTH INSURANCE

## 2022-11-17 DIAGNOSIS — J4540 Moderate persistent asthma, uncomplicated: Secondary | ICD-10-CM

## 2022-11-17 DIAGNOSIS — J3089 Other allergic rhinitis: Secondary | ICD-10-CM

## 2022-11-17 MED ORDER — dextroamphetamine-amphetamine (ADDERALL) 20 mg Tab
20 | ORAL_TABLET | Freq: Two times a day (BID) | ORAL | 0 refills | Status: AC
Start: 2022-11-17 — End: 2022-12-17

## 2022-11-17 MED ORDER — dextroamphetamine-amphetamine (ADDERALL) 20 mg Tab
20 | ORAL_TABLET | Freq: Two times a day (BID) | ORAL | 0 refills | Status: AC
Start: 2022-11-17 — End: 2023-02-15

## 2022-11-17 MED ORDER — dextroamphetamine-amphetamine (ADDERALL) 20 mg Tab
20 | ORAL_TABLET | Freq: Two times a day (BID) | ORAL | 0 refills | Status: AC
Start: 2022-11-17 — End: 2023-01-16

## 2022-11-17 MED ORDER — albuterol (PROAIR HFA) 90 mcg/actuation Inhl inhaler
90 | Freq: Four times a day (QID) | RESPIRATORY_TRACT | 11 refills | Status: AC | PRN
Start: 2022-11-17 — End: ?

## 2022-11-17 NOTE — Progress Notes (Signed)
Subjective   HPI:   Patient ID: Mary [name] is a 52 y.o. female.    The following HPI was reviewed and copied forward (with edits) from a note written by me on 06/25/22. I have reviewed and updated the history, physical exam, data, assessment, and plan of the note as detailed below so that it reflects my evaluation and management of the patient.       The patient's chart was reviewed prior to the visit as part of pre-visit planning  No chief complaint on file.     ADD, asthma and needs referrals  Asthma: no fu w pulm, was seen thru [facility], symptoms are not well controlled esp w allergies  A/I concerns: symptoms worse since entering perimenopause; feel always sick, losing hair; past ESR was a little high, had neg ANA, Rheum factor; no fhx v known A/I disorders; wondering about seeing rheum     Past: AXR 40mg  once daily  b/o supply issues past several months has been on SA form 20mg  bid (am and pm), concentration is doing ok (past did better w 45mg  but $$$ issues)    Is keen to reduce dose and eventually dc   Denies any s/e   Denies diversion or st/drug abuse   Has rec'ed CTR for MM, I have only gone once, using for sleep 2x/week: vape works better than the edible altho it does impact her asthma too much          09/30/2020     1:12 PM 01/20/2021     2:32 PM 04/22/2021     2:46 PM 10/27/2021     1:42 PM 01/28/2022     4:21 PM 04/29/2022     1:53 PM 06/25/2022     4:05 PM   Controlled Substance Monitoring   OARRS/eKASPER Status Reviewed Reviewed Reviewed Reviewed Reviewed Reviewed Reviewed   OARRS/eKASPER Consistent with Prescriber Expectation Joanie Coddington     I have completed the required OARRS/eKASPER documentation for this patient on 11/17/2022.  Rikita Grabert Jennette Kettle      Past Medical History:   Diagnosis Date    ADD (attention deficit disorder)     Allergy     Anemia     Anxiety     Asthma     Cancer (CMS-HCC)     Cervical dysplasia     Chronic back pain     Depression     Diverticulitis     Environmental allergies     Iron  deficiency     Varicella     Vitamin D deficiency        Current Outpatient Medications   Medication Sig Dispense Refill    albuterol (PROAIR HFA) 90 mcg/actuation Inhl inhaler Inhale 2 puffs into the lungs every 6 hours as needed for Wheezing. 8 g 11    dextroamphetamine-amphetamine (ADDERALL) 20 mg Tab Take 1 tablet (20 mg total) by mouth in the morning and at bedtime for 30 days. 60 tablet 0    [START ON 12/17/2022] dextroamphetamine-amphetamine (ADDERALL) 20 mg Tab Take 1 tablet (20 mg total) by mouth in the morning and at bedtime for 30 days. 60 tablet 0    [START ON 01/16/2023] dextroamphetamine-amphetamine (ADDERALL) 20 mg Tab Take 1 tablet (20 mg total) by mouth in the morning and at bedtime for 30 days. 60 tablet 0    acetaminophen (TYLENOL) 500 MG tablet Take 2 tablets (1,000 mg total) by mouth every 8 hours. 60 tablet 1  azelastine (ASTELIN) 137 mcg (0.1 %) nasal spray Use 1 spray into each nostril 2 times a day. Use in each nostril as directed 30 mL 1    budesonide-formoteroL (SYMBICORT) 160-4.5 mcg/actuation inhaler Inhale 1 puff into the lungs 2 times a day. 10.2 g 3    cetirizine (ZYRTEC) 10 MG tablet Take 1 tablet (10 mg total) by mouth daily as needed for Allergies.      cholecalciferol, vitamin D3, 125 mcg (5,000 unit) Tab Take by mouth.      escitalopram oxalate (LEXAPRO) 20 MG tablet Take 1 tablet (20 mg total) by mouth daily. 90 tablet 3    FERROUS SULFATE (IRON ORAL) Take by mouth.      fluticasone (FLONASE) 50 mcg/actuation nasal spray SHAKE LIQUID AND USE 1 TO 2 SPRAYS IN EACH NOSTRIL DAILY AS NEEDED 16 g 0    montelukast (SINGULAIR) 10 mg tablet Take 1 tablet (10 mg total) by mouth at bedtime. 90 tablet 3    predniSONE (DELTASONE) 20 MG tablet 1 by mouth twice daily x 3 days then 1 by mouth once daily x 4 days (Patient taking differently: 1 by mouth twice daily x 3 days then 1 by mouth once daily x 4 days  As needed) 10 tablet 0    senna-docusate (SENNA-S) 8.6-50 mg per tablet Take 1 tablet  by mouth every 12 hours as needed for Constipation. 20 tablet 0     No current facility-administered medications for this visit.          ROS:   Review of Systems  as above   Objective:   Physical Exam      Wt Readings from Last 3 Encounters:   04/08/22 165 lb (74.8 kg)   02/03/22 165 lb (74.8 kg)   01/22/22 165 lb (74.8 kg)       There were no vitals filed for this visit.  There is no height or weight on file to calculate BMI.  There is no height or weight on file to calculate BSA.    NAD  Psych: A&Ox3; reactive affect; no obv thought d/o       Lab Results   Component Value Date    WBC 4.5 01/22/2022    HGB 13.2 01/22/2022    HCT 39.6 01/22/2022    MCV 88.8 01/22/2022    PLT 262 01/22/2022       Lab Results   Component Value Date    HGBA1C 5.9 (H) 10/01/2020     Lab Results   Component Value Date    CREATININE 0.65 01/22/2022       Lab Results   Component Value Date    GLUCOSE 86 01/22/2022    BUN 11 01/22/2022    CO2 25 01/22/2022    CREATININE 0.65 01/22/2022    K 4.3 01/22/2022    NA 136 01/22/2022    CL 101 01/22/2022    CALCIUM 9.4 01/22/2022    ALBUMIN 4.5 10/01/2020    PROT 7.2 10/01/2020    ALKPHOS 47 10/01/2020    ALT 12 10/01/2020    AST 30 09/30/2017    BILITOT 0.8 10/01/2020       Lab Results   Component Value Date    CHOLTOT 248 (H) 10/01/2020    TRIG 108 10/01/2020    HDL 87 10/01/2020    LDL 139 10/01/2020       Lab Results   Component Value Date    TSH 1.79 10/01/2020  Lab Results   Component Value Date    VITAMINB12 >1506 (H) 10/01/2020          Upcoming Health Maintenance       Hepatitis C Screening (MyChart) (Once)  Ordered on 11/17/2022    Immunization: Hepatitis B (1 of 3 - 3-dose series)  Never done    Comprehensive Physical Exam (Yearly)  Never done    HIV Screening (Once)  Ordered on 11/17/2022    Immunization: DTaP/Tdap/Td (1 - Tdap)  Never done    Immunization: Pneumococcal (2 - PCV)  Overdue since 10/04/2009    Immunization: Zoster (1 of 2)  Never done    Cervical Cancer Screening/Pap  Smear (MyChart) (Every 3 Years)  Overdue since 05/13/2020    Depression Monitoring (PHQ-9) (Every 3 Months)  Overdue since 04/30/2021    Immunization: Influenza (MyChart) (1)  Overdue since 06/04/2022    Mammogram (MyChart) (Every 2 Years)  Overdue since 08/20/2022    Alcohol Misuse Screening (Yearly)  Due since 10/27/2022    Colorectal Cancer Screening (MyChart) (Cologuard (FIT-DNA) - Every 3 Years)  Next due on 12/04/2022    Diabetes Screening (Every 3 Years)  Ordered on 11/17/2022           Immunization History   Administered Date(s) Administered    COVID-19, mRNA, Moderna monovalent, age 63+ 04/20/2021, 05/18/2021    Influenza, quadrivalent, intradermal, preservative-free 07/04/2017    Influenza, quadrivalent, preservative-free 08/06/2015, 06/25/2016, 09/11/2018, 09/30/2020    Influenza, unspecified 05/31/2008    Pneumococcal polysaccharide, 23-valent 10/04/2008         Assessment/Plan:     This was a Phone conversation, in lieu of an in-person visit. The patient provided verbal consent to participate in the telehealth visit.   I spent 12 minutes speaking with the patient, conducting an interview, performing a limited exam, and educating the patient on my assessment and plan. I also spent 3 minutes, on the same day as the encounter, preparing to see the patient (eg, review of tests), ordering medications, tests, or procedures, documenting clinical information in the electronic or other health record, and performing non-face-to-face activities.       A/P:  Patient Instructions     Encounter Diagnoses   Name Primary?    Moderate persistent asthma without complication  - Prescription(s) were electronically prescribed today  - pulmonary consult: see below Yes    Autoimmune disorder (CMS-HCC)  - check labs: consider rheumatology consult     Well adult exam  - 12 hours FASTING for blood tests:  The lab in the [place] building is open Monday-Friday from 7 a.m. to 5:30 p.m and Saturdays from 7:30 am- 1 pm   The lab in the [place]  building is open Monday-Friday 7:30 am to 4:30 pm      Attention deficit disorder, unspecified hyperactivity presence  - stable: continue with the current treatment            Please call to schedule your appointment:  [facility]: [phone]     RTC: 3 months    New meds:   none  Patient education given  none  Patient verbalized understanding & agreement of the proposed plan.

## 2022-11-17 NOTE — Patient Instructions (Addendum)
Encounter Diagnoses   Name Primary?    Moderate persistent asthma without complication  - Prescription(s) were electronically prescribed today  - pulmonary consult: see below Yes    Autoimmune disorder (CMS-HCC)  - check labs: consider rheumatology consult     Well adult exam  - 12 hours FASTING for blood tests:  The lab in the [facility] building is open Monday-Friday from 7 a.m. to 5:30 p.m and Saturdays from 7:30 am- 1 pm   The lab in the [facility] building is open Monday-Friday 7:30 am to 4:30 pm      Attention deficit disorder, unspecified hyperactivity presence  - stable: continue with the current treatment            Please call to schedule your appointment:  [facility]: [phone]

## 2022-11-17 NOTE — Progress Notes (Signed)
[facility]  ALLERGY TREATMENT VIAL WORKSHEET ENVIRONMENTAL  NAME: Mary [name], Mary [name] DOB: 1971-08-30 DR. SEDAGHAT DATE: 11/17/2022  EXTRACT#1 START DATE: 11/2020  DUST MITES: End Point: Faith Rogue Dilution: Volume: ml   D. Farinae 4 Concentrate 0.2   D. Pteronyssinus 4 Concentrate 0.2   ANIMALS:      Cockroach 3 Not treated ---   Cat 5 Concentrate 0.2   Dog 3 Concentrate 0.2   MOLDS      *Alternaria 4 Concentrate 0.2   *Aspergillus 6 Concentrate 0.2   Helminthosporium 3 Not treated ---   Hormodendrum 3 Not treated ---   Candida Albicans 3 Not treated ---   Penicillium notatum 3 Not treated ---   Fusarium Oxysporium 5 Concentrate 0.2   Epicoccum 5 Concentrate 0.2   Pullularia 4 Concentrate 0.2   Rhizopus 3 Not treated ---   Total Volume of Antigens: 1.10ml   Total Volume of Diluent: 3.71ml   Total Volume:   65ml   EXTRACT#2 START DATE: 11/2020  TREES      Cedar 5 Concentrate 0.2   Birch 6 Concentrate 0.2   Ash 5 Not treated ---   Pecan/ Hickory 5 Concentrate 0.2   Elm 6 Not treated ---   [place] 3 Not treated ---   Oak 5 Concentrate 0.2   Cottonwood 4 Not treated ---   GRASSES      Johnson Grass 5 Not treated ---   Christia Reading Grass 5 Concentrate 0.2   [place] Grass 4 Concentrate 0.2   WEEDS      English Plantain 4 Concentrate 0.2   Ragweed 6 Concentrate 0.2   Lambs quarter 3 Not treated ---   Pigweed 3 Not treated ---   Sorrel 3 Not treated ---   Wormwood 5 Concentrate 0.2   Total Volume of Antigens: 1.12ml   Total Volume of Diluent: 3.71ml     Total Volume:    102ml   Prepared by: G.Maria Coin BSN RN 11/17/2022   Charges generated on paper invoice.  Extract lot numbers [id number]    D.Yehuda Mao: [id number] EXP: 11/14/2024  D.Pteronyssinus: [id number] EXP: 12/07/2023    Cat: [id number] EXP: 05/16/2024  Dog Hair: [id number] EXP: 07/05/2025    Alternaria: [id number] EXP: 07/05/2025  Aspergillus: [id number] EXP: 07/05/2025  Fusarium: [id number] EXP: 05/17/2025  Epiccocum: [id number] EXP: 12/13/2024  Pullaria: [id number] EXP: 11/04/2023    Cedar: [id number] EXP:  04/21/2025  Wendee Copp, [place]: [id number] EXP: 08/22/2025  Pecan/Hickory: [id number] EXP: 07/26/2025  Alger Simons Red: [id number] EXP: 07/26/2025    English Plantain: [id number] EXP: 07/05/2025  Ragweed Short/Giant: [id number] EXP: 08/26/2024  Wormwood: [id number] EXP: 02/27/2025    Jalene Mullet: [id number] EXP: 11/21/2024  [place] Grass: (10) [id number] EXP: 12/19/2024

## 2022-11-17 NOTE — Progress Notes (Signed)
Allergy injections administered

## 2022-11-24 ENCOUNTER — Ambulatory Visit: Payer: PRIVATE HEALTH INSURANCE

## 2022-12-01 ENCOUNTER — Ambulatory Visit: Payer: PRIVATE HEALTH INSURANCE

## 2022-12-01 NOTE — Telephone Encounter (Signed)
Attempted to call patient to schedule her OV with Dr, Hassan Rowan left VM and central scheduling line for callback

## 2022-12-02 NOTE — Telephone Encounter (Signed)
I have attempted to reach to this patient multiple times LVM and central scheduling for call back to schedule her OV I also sent MyChart message.

## 2022-12-08 ENCOUNTER — Ambulatory Visit: Payer: PRIVATE HEALTH INSURANCE

## 2022-12-08 ENCOUNTER — Ambulatory Visit: Admit: 2022-12-08 | Discharge: 2022-12-12 | Payer: PRIVATE HEALTH INSURANCE | Attending: Acute Care

## 2022-12-08 ENCOUNTER — Institutional Professional Consult (permissible substitution): Admit: 2022-12-08 | Discharge: 2022-12-08 | Payer: PRIVATE HEALTH INSURANCE

## 2022-12-08 DIAGNOSIS — J4550 Severe persistent asthma, uncomplicated: Secondary | ICD-10-CM

## 2022-12-08 DIAGNOSIS — J3089 Other allergic rhinitis: Secondary | ICD-10-CM

## 2022-12-08 MED ORDER — omeprazole (PRILOSEC) 40 MG capsule
40 | ORAL_CAPSULE | Freq: Every morning | ORAL | 0 refills | Status: AC
Start: 2022-12-08 — End: ?

## 2022-12-08 MED ORDER — fluticasone-umeclidin-vilanter (TRELEGY ELLIPTA) 200-62.5-25 mcg DsDv
200-62.5-25 | Freq: Every day | RESPIRATORY_TRACT | 5 refills | Status: AC
Start: 2022-12-08 — End: ?
  Filled 2022-12-09: qty 60, 30d supply, fill #0

## 2022-12-08 NOTE — Progress Notes (Signed)
Extract #1: Escalating: DM, CT, DG, M  Dose: vial test  Site: right arm  Administered by: G.Brigitta Pricer, BSN, RN    Extract #2: Escalating: TR, GR, W  Dose: vial test  Site: left arm  Administered by: G.Melanie Openshaw, BSN, RN    No noted reactions from the previous injection. The patient denies any fever or any other symptoms from the last 24 hours.     Vial test: Extract #1 and #2: 11 mm wheal.

## 2022-12-08 NOTE — Progress Notes (Addendum)
Initial Outpatient Pulmonary Medicine Consultation     REASON FOR PULMONARY CONSULT:  Chief Complaint   Patient presents with    New Patient Visit/ Consultation     Moderate persistent asthma without complication    Shortness of Breath     Always     Palpitations       HPI  Mary [name] is a 52 y.o. female who presents for New Patient Visit/ Consultation,  Moderate persistent asthma without complication, Shortness of Breath (Always ), and Palpitations    Hx of asthma, last seen in 2016. Hx of chronic rhinosinusitis and environmental allergies followed by Dr. Georganna Skeans on immune therapy. Pt referred by PCP for asthma management. Currently on symbicort, albuterol, singulair. Previously on high dose advair and it helped more but insurance would not cover it. Had sinus surgery in 02/2022. No admissions for asthma in the last year. ER visit for asthma in May 2022. Has not been on steroids for a while, hates the way they make her feel. SOB is constant, makes it difficult for her to do her daily activities. Had to give up tennis because of her asthma. Has not noticed any improvement in her symptoms since sinus surgery or allergy immune therapy. No significant cough. Has reflux a few days a month. Has woken up from sleep with reflux. Excessive snoring, has to sleep in a different room than her husband. Does not have a consistent sleep schedule. Sometimes difficulty sleeping at night then sleeps during the late morning. Can fall asleep at 11am and nap  until 3p. Falls asleep easily watching tv. No sleep walking. Sleep talking or paralysis. Thinks she may have had a sleep study in the past but does not think she slept. Has some palpitations off and on. Does have two hypoallergenic dogs at home. Has always had dogs. The hypoallergenic dogs she has had for 89yrs and did not affect her asthma symptoms. Changes air filters regularly    Previous Note (08/06/15):  Hx of asthma on symbicort recently increased to 2 puffs bid by PCP  Dr. Candiss Norse. Has had asthma all of her life, diagnosed as an infant. Has been hospitalized for asthma and pneumonia at age 42 and 12. Moved to [place] about 42yrs ago and asthma symptoms have been worse over last 50yrs. Used to be on advair and symptoms were much better controlled. Uses albuterol everyday good day 2 times a day, bad day 5 times a day or more.     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     AUGMENTATION MAMMAPLASTY  age 86-24    [place], [place]    BIOPSY BREAST NEEDLE LOCALIZATION Right 06/02/2016    Procedure: RIGHT BREAST  NEEDLE LOCALIZED EXCISIONAL BIOPSY OF 2 AREAS,   ;  Surgeon: Lorine Bears, MD;  Location: [facility] OR;  Service: General;  Laterality: Right;    BREAST EXCISIONAL BIOPSY      atypical cells    CERVICAL CONE BIOPSY      COSMETIC SURGERY      ENDOMETRIAL BIOPSY      benign polyp    FUNCTONAL ENDO SINUS SURGERY WITH LANDMARK Bilateral 02/03/2022    Procedure: endoscopic sinus surgery with septoplasty and inferior turbinate reductions.;  Surgeon: Ronald Pippins, MD;  Location: [facility] OR;  Service: ENT;  Laterality: Bilateral;    HYSTEROSCOPY ABLATION ENDOMETRIAL N/A 06/02/2016    Procedure: HYSTEROSCOPIC RESECTION OF ENDOMETRIAL POLYP;  Surgeon: Desiree Hane;  Location[facility] OR;  Service: Gynecology;  Laterality: N/A;    uterine biopsy       x2       CURRENT HOME MEDICATIONS  Current Outpatient Medications   Medication Sig    albuterol Inhale 2 puffs into the lungs every 6  hours as needed for Wheezing.    azelastine Use 1 spray into each nostril 2 times a day. Use in each nostril as directed    budesonide-formoteroL Inhale 1 puff into the lungs 2 times a day.    cetirizine Take 1 tablet (10 mg total) by mouth daily as needed for Allergies.    cholecalciferol (vitamin D3) Take by mouth.    dextroamphetamine-amphetamine Take 1 tablet (20 mg total) by mouth in the morning and at bedtime for 30 days.    [START ON 12/17/2022] dextroamphetamine-amphetamine Take 1 tablet (20 mg total) by mouth in the morning and at bedtime for 30 days.    [START ON 01/16/2023] dextroamphetamine-amphetamine Take 1 tablet (20 mg total) by mouth in the morning and at bedtime for 30 days.    escitalopram oxalate Take 1 tablet (20 mg total) by mouth daily.    FERROUS SULFATE (IRON ORAL) Take by mouth.    fluticasone propionate SHAKE LIQUID AND USE 1 TO 2 SPRAYS IN EACH NOSTRIL DAILY AS NEEDED    montelukast Take 1 tablet (10 mg total) by mouth at bedtime.    predniSONE 1 by mouth twice daily x 3 days then 1 by mouth once daily x 4 days (Patient taking differently: 1 by mouth twice daily x 3 days then 1 by mouth once daily x 4 days  As needed)     No current facility-administered medications for this visit.       ALLERGIES  No Known Allergies    SOCIAL HISTORY  Social History     Socioeconomic History    Marital status: Married     Spouse name: None    Number of children: None    Years of education: None    Highest education level: None   Occupational History    None   Tobacco Use    Smoking status: Never    Smokeless tobacco: Never    Tobacco comments:     02/24/2011; passive smoke exposure:no (06/18/2005)   Vaping Use    Vaping Use: Never used   Substance and Sexual Activity    Alcohol use: Yes     Alcohol/week: 4.0 standard drinks of alcohol     Types: 4 Glasses of wine per week     Comment: socially    Drug use: No     Comment: 01/07/2011    Sexual activity: Yes     Partners: Female   Other Topics Concern  Caffeine Use Yes    Occupational Exposure No    Exercise No    Seat Belt Yes   Social History Narrative    Currently not working. Lives in a newer home with husband and 2 kids, no known mold in the home, no pets at home, no occupational exposures.      Social Determinants of Health     Food Insecurity: No Food Insecurity (11/17/2022)    Yearly Questionnaire     Do you need any assistance with obtaining housing, meals, medication, transportation or medical equipment?: No     Assistance needed for:: Not on file   Transportation Needs: No Transportation Needs (11/17/2022)    Yearly Questionnaire     Do you need any assistance with obtaining housing, meals, medication, transportation or medical equipment?: No     Assistance needed for:: Not on file   Intimate Partner Violence: Not on file   Housing Stability: Low Risk  (11/17/2022)    Yearly Questionnaire     Do you need any assistance with obtaining housing, meals, medication, transportation or medical equipment?: No     Assistance needed for:: Not on file         FAMILY HISTORY  Family History   Problem Relation Age of Onset    Uterine Cancer Mother 20        uterine vs cervical     Hypertension Father     Asthma Father     Alcohol abuse Father     Lung Cancer Father     Bone Cancer Paternal Grandmother 41    Asthma Other     Allergies Other     Melanoma Neg Hx     Breast Cancer Neg Hx     Ovarian cancer Neg Hx     Colon Cancer Neg Hx     Pancreatic Cancer Neg Hx     Prostate Cancer Neg Hx     Anesthesia problems Neg Hx        ROS:  CONSTITUTIONAL: No fever/chills or night sweats, no recent weight change.  No sick contacts.  HENT:  No epistaxsis, chronic nasal discharge, no sore throat.  CARDIAC: No chest pains or palpitations, no lower extremity edema, no orthopnea, or PND.  RESPIRATORY: see HPI  GI:  No dysphagia, occ heartburn, no melena, no hematochezia.  MUSCULOSKELETAL: No joint pain.  No Raynaud's Phenomenon.  SKIN: No rashes or lesions.  NEURO: No morning  headaches, syncope, or TIA symptoms.  PSYCHIATRIC: No depression or anxiety.  Sleep:  see HPI    PHYSICAL EXAM:  VITAL SIGNS:   Vitals:    12/08/22 1456   BP: 128/76   Pulse: 81   SpO2: 97%   Weight: 171 lb (77.6 kg)   Height: 5' 6 (1.676 m)     Wt Readings from Last 3 Encounters:   12/08/22 171 lb (77.6 kg)   04/08/22 165 lb (74.8 kg)   02/03/22 165 lb (74.8 kg)     GEN: No respiratory distress, alert and oriented x3  HEENT: Atraumatic, normocephalic, pupils equal reactive to light, extraocular movements intact, sclera anicteric, no maxillary or frontal sinus tenderness to palpation, mild nasal mucosal erythema/edema, mucous membranes moist, no oral lesions. Mallampati grade 3.  NECK:  supple, no masses, no lymphadenopathy  CARDIOVASCULAR: Regular Rate and Rhythm, no murmurs gallops or rubs. Radial pulse 2+ and equal.   LUNGS: Clear to ausculation bilaterally, no wheeze, no rhonchi, no rales, no egophony, no dullness to percussion  ABDOMEN: Soft, non-tender, nondistended, bowel sounds present  SKIN: Warm, dry  EXTREMITIES: No clubbing, no pitting edema.  NEURO: CN 2-12 grossly intact. No gross focal deficits.    PSYCH: Normal affect and mood.    Data:    07/29/15 PFT  Pulmonary Function Tests Interpretation     Obstructive defect is present.  Spirometry shows moderately severe obstructive defect.  There is a significant response to bronchodilator demonstrated.  Air trapping is present.  Diffusing capacity is elevated and may suggest asthma.      6 Minute Walk    Total Distance Walked:  2050 feet  Baseline SpO2:  97     Lowest SpO2:  96  Oximetry with 6 minute walk test shows no desaturation with submaximal exercise.  Read performed by:    02/27/21 CXR  No acute disease    09/30/17 Eos 182, 09/12/15 Eos 239    06/25/15 IgE 272 elevated    12/29/21 Sinus CT  1.  No significant paranasal sinus disease.   2.  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   3.  Multilevel cervical facet arthrosis with degenerative  subluxations discussed above.   4.  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 If potentially of clinical relevance, MRI IAC protocol without and with contrast would be helpful for further characterization. Stability since 2021 would make an aggressive or malignant process unlikely.               IMPRESSION:   Moderate persistent asthma  Chronic sinusitis  Allergies  Palpitations  Excessive somnolence    PLAN:    Cont ICS/LABA will add trial of LAMA  Cont albuterol as needed  Cont singulair, zyrtec, flonase  Trial omeprazole  Home sleep study  Echo  Repeat PFT  CXR  Follow up for results

## 2022-12-09 NOTE — Progress Notes (Signed)
Transitions of [place] prescription for Trelegy was sent to [facility] for a benefits review by the Transitions of Care team.     Mary [name] has a [employer] prescription plan.  The medication did not require a prior authorization.  The patient's co-pay is $631.83 for a 30 day supply.  Tried contacting [name] on mobile phone listed but had to leave voicemail.

## 2022-12-15 ENCOUNTER — Ambulatory Visit: Payer: PRIVATE HEALTH INSURANCE

## 2022-12-17 ENCOUNTER — Ambulatory Visit: Payer: PRIVATE HEALTH INSURANCE

## 2022-12-17 NOTE — Progress Notes (Signed)
Transitions of Care      Spoke to Mary [name], copay of [phone] is not affordable for her.  Looking into plan to see what is preferred and potential costs.

## 2022-12-20 NOTE — Progress Notes (Signed)
Transitions of Care      On 3/18, a follow-up call was placed but Mary [name] did not answer.  Left voicemail requesting a return call.

## 2022-12-22 ENCOUNTER — Institutional Professional Consult (permissible substitution)
Admit: 2022-12-22 | Discharge: 2022-12-22 | Payer: PRIVATE HEALTH INSURANCE | Attending: Clinical & Laboratory Immunology

## 2022-12-22 ENCOUNTER — Other Ambulatory Visit: Admit: 2022-12-22 | Payer: PRIVATE HEALTH INSURANCE

## 2022-12-22 DIAGNOSIS — J3089 Other allergic rhinitis: Secondary | ICD-10-CM

## 2022-12-22 DIAGNOSIS — D8989 Other specified disorders involving the immune mechanism, not elsewhere classified: Secondary | ICD-10-CM

## 2022-12-22 LAB — CBC
Hematocrit: 37.7 % (ref 35.0–45.0)
Hemoglobin: 12.9 g/dL (ref 11.7–15.5)
MCH: 29.9 pg (ref 27.0–33.0)
MCHC: 34.1 g/dL (ref 32.0–36.0)
MCV: 87.7 fL (ref 80.0–100.0)
MPV: 8.7 fL (ref 7.5–11.5)
Platelets: 274 10*3/uL (ref 140–400)
RBC: 4.3 10*6/uL (ref 3.80–5.10)
RDW: 12.8 % (ref 11.0–15.0)
WBC: 4.3 10*3/uL (ref 3.8–10.8)

## 2022-12-22 LAB — LIPID PANEL
Cholesterol, Total: 251 mg/dL — ABNORMAL HIGH (ref 0–200)
HDL: 75 mg/dL (ref 60–92)
LDL Cholesterol: 143 mg/dL
Non-HDL Cholesterol, Calculated: 176 mg/dL — ABNORMAL HIGH (ref 0–129)
Triglycerides: 164 mg/dL — ABNORMAL HIGH (ref 10–149)

## 2022-12-22 LAB — COMPREHENSIVE METABOLIC PANEL, SERUM
ALT: 11 U/L (ref 7–52)
AST (SGOT): 16 U/L (ref 13–39)
Albumin: 4 g/dL (ref 3.5–5.7)
Alkaline Phosphatase: 43 U/L (ref 36–125)
Anion Gap: 12 mmol/L (ref 3–16)
BUN: 11 mg/dL (ref 7–25)
CO2: 26 mmol/L (ref 21–33)
Calcium: 9.3 mg/dL (ref 8.6–10.3)
Chloride: 100 mmol/L (ref 98–110)
Creatinine: 0.72 mg/dL (ref 0.60–1.30)
EGFR: >90
Glucose: 101 mg/dL — ABNORMAL HIGH (ref 70–100)
Osmolality, Calculated: 286 mOsm/kg (ref 278–305)
Potassium: 4.2 mmol/L (ref 3.5–5.3)
Sodium: 138 mmol/L (ref 133–146)
Total Bilirubin: 1.3 mg/dL (ref 0.0–1.5)
Total Protein: 7.2 g/dL (ref 6.4–8.9)

## 2022-12-22 LAB — DIFFERENTIAL
Basophils Absolute: 56 /uL (ref 0–200)
Basophils Relative: 1.3 % — ABNORMAL HIGH (ref 0.0–1.0)
Eosinophils Absolute: 142 /uL (ref 15–500)
Eosinophils Relative: 3.3 % (ref 0.0–8.0)
Lymphocytes Absolute: 1247 /uL (ref 850–3900)
Lymphocytes Relative: 29 % (ref 15.0–45.0)
Monocytes Absolute: 297 /uL (ref 200–950)
Monocytes Relative: 6.9 % (ref 0.0–12.0)
Neutrophils Absolute: 2559 /uL (ref 1500–7800)
Neutrophils Relative: 59.5 % (ref 40.0–80.0)
nRBC: 0 /100 WBC (ref 0–0)

## 2022-12-22 LAB — ANA (IFA) WITH TITER: ANA Titer by IFA: NEGATIVE

## 2022-12-22 LAB — THYROID FUNCTION CASCADE: TSH: 3.32 u[IU]/mL (ref 0.45–4.12)

## 2022-12-22 LAB — RHEUMATOID FACTOR: Rheumatoid Factor: <10 IU/mL (ref 0.0–14.0)

## 2022-12-22 LAB — HEPATITIS C ANTIBODY: HCV Ab: NONREACTIVE

## 2022-12-22 LAB — HEMOGLOBIN A1C: Hemoglobin A1C: 5.8 % — ABNORMAL HIGH (ref 4.0–5.6)

## 2022-12-22 LAB — IGE: IgE: 85 IU/mL (ref 6–495)

## 2022-12-22 LAB — HIV 1+2 ANTIBODY/ANTIGEN WITH REFLEX: HIV 1+2 AB/AGN: NONREACTIVE

## 2022-12-22 LAB — C-REACTIVE PROTEIN: CRP: 3.9 mg/L (ref 1.0–10.0)

## 2022-12-22 LAB — SED RATE: Sed Rate: 12 mm/hr (ref 0–30)

## 2022-12-22 NOTE — Progress Notes (Signed)
Patient hasn't had any reactions from their allergy injections. Doing well with their Immunotherapy. Mold and pollens avoidence guidelines were discussed. Patient was advised to continue their nasal rinsing and stay inside when mold and pollen counts were high. Patient was shone their allergy vials to verify their name and date of birth.

## 2022-12-28 NOTE — Progress Notes (Signed)
Transitions of Care      On 3/26, another call was placed to the mobile phone listed.  Left a voicemail requesting a return call.

## 2022-12-29 ENCOUNTER — Institutional Professional Consult (permissible substitution)
Admit: 2022-12-29 | Discharge: 2022-12-29 | Payer: PRIVATE HEALTH INSURANCE | Attending: Clinical & Laboratory Immunology

## 2022-12-29 DIAGNOSIS — J3089 Other allergic rhinitis: Secondary | ICD-10-CM

## 2022-12-29 NOTE — Progress Notes (Signed)
Patient hasn't had any reactions from their allergy injections. Doing well with their Immunotherapy. Mold and pollens avoidence guidelines were discussed. Patient was advised to continue their nasal rinsing and stay inside when mold and pollen counts were high. Patient was shone their allergy vials to verify their name and date of birth.

## 2023-01-04 ENCOUNTER — Ambulatory Visit: Payer: PRIVATE HEALTH INSURANCE

## 2023-01-05 ENCOUNTER — Ambulatory Visit: Payer: PRIVATE HEALTH INSURANCE

## 2023-01-12 ENCOUNTER — Ambulatory Visit: Payer: PRIVATE HEALTH INSURANCE

## 2023-01-12 NOTE — Progress Notes (Signed)
Transitions of Care      On 4/10, another call was placed to the mobile phone listed.  Left a voicemail requesting a return call.

## 2023-01-19 ENCOUNTER — Institutional Professional Consult (permissible substitution): Admit: 2023-01-19 | Discharge: 2023-01-19 | Payer: PRIVATE HEALTH INSURANCE

## 2023-01-19 DIAGNOSIS — J3089 Other allergic rhinitis: Secondary | ICD-10-CM

## 2023-01-19 NOTE — Progress Notes (Signed)
Patient Verified Name and DOB; patient shown no reaction to previous injection; patient show no immediate reaction to current injection.        Patient is Escalating    Right arm: 0.05  Left Arm: 0.05    Applied benadryl to injection site   Mary [name]

## 2023-01-26 ENCOUNTER — Institutional Professional Consult (permissible substitution)
Admit: 2023-01-26 | Discharge: 2023-01-26 | Payer: PRIVATE HEALTH INSURANCE | Attending: Clinical & Laboratory Immunology

## 2023-01-26 DIAGNOSIS — J3089 Other allergic rhinitis: Secondary | ICD-10-CM

## 2023-01-26 NOTE — Progress Notes (Signed)
Patient hasn't had any reactions from their allergy injections. Doing well with their Immunotherapy. Mold and pollens avoidence guidelines were discussed. Patient was advised to continue their nasal rinsing and stay inside when mold and pollen counts were high. Patient was shone their allergy vials to verify their name and date of birth.

## 2023-02-01 ENCOUNTER — Inpatient Hospital Stay: Admit: 2023-02-01 | Payer: PRIVATE HEALTH INSURANCE | Attending: Acute Care

## 2023-02-01 DIAGNOSIS — J4550 Severe persistent asthma, uncomplicated: Secondary | ICD-10-CM

## 2023-02-01 DIAGNOSIS — R002 Palpitations: Secondary | ICD-10-CM

## 2023-02-01 LAB — PFT13-PULMONARY FUNCTION TEST
DLCO%: 112
DLCO: 24.01
FEV1%: 58
FEV1/FVC EXP: 69
FEV1/FVC: 53
FEV1: 1.59
FVC%: 88
FVC: 3.01
RESPONSE TO BRONCHODILATOR: 21
RV%: 265
RV: 4.51
TLC%: 140
TLC: 7.78
VC%: 96
VC: 3.27

## 2023-02-01 MED ORDER — albuterol (PROVENTIL) nebulizer solution 2.5 mg
2.5 | Freq: Once | RESPIRATORY_TRACT | Status: AC
Start: 2023-02-01 — End: 2023-02-01
  Administered 2023-02-01: 16:00:00 2.5 mg via RESPIRATORY_TRACT

## 2023-02-01 MED FILL — ALBUTEROL SULFATE 2.5 MG/3 ML (0.083 %) SOLUTION FOR NEBULIZATION: 2.5 2.5 mg /3 mL (0.083 %) | RESPIRATORY_TRACT | Qty: 3

## 2023-02-01 NOTE — Procedures (Signed)
Pulmonary Function Test:        Tests:   Spirometry:  Spirometry shows a moderate obstructive defect.    Bronchodilator:  There is a significant response to bronchodilator demonstrated.    Lung Volume:  Hyperinflation and air trapping are present on lung volume testing.    Diffusing Capacity:  Diffusing capacity is normal.      6 Minute Walk    A 6 minute walk was performed in accordance with the ATS Guidelines [name] Crit Care Med 2002; 166: 111-117) at the Pulmonary Function Laboratory.        Diagnosis: asthma   Height: 5' 6 (167.6 cm)   Weight: 171 lb (77.6 kg)   Blood Pressure:       Dyspnea Pre Borg Scale: 0   Fatigue Pre Borg Scale: 2     Minutes - SpO2 - HR  -  FiO2   Baseline -    96   -  80  -  0   Minutes 1 -    97   -  92  -   0   Minutes 2 -    96   -  101  -   0   Minutes 3 -    97   -  103  -   0   Minutes 4 -    97   -  108  -   0   Minutes 5 -    98   -  105  -   0   Minutes 6 -    98   -  104  -   0   Recovery   Minutes 1 -    98   -  92   Minutes 2 -    98   -  79       Dyspnea Post Borg Scale: 0   Fatigue Post Borg Scale: 2     Total distance walked in feet: 1610   Total distance walked in meters: 490.73     Oximetry with 6 minute walk test shows no desaturation with submaximal exercise. Test was done on RA.      Interpreting Physician: Ivery Quale, MD  Date: 02/01/2023

## 2023-02-01 NOTE — Progress Notes (Signed)
02/01/23 1200   6 Minute Walk   Ordering Physician: chikwa   Diagnosis: asthma   Height 5' 6 (1.676 m)   Weight 171 lb (77.6 kg)   FiO2 During Test: Room Air   Pre-Walk   Pre - Borg (Dyspnea): 0   Pre - Borg (Fatigue): 2   Baseline:   SpO2: 96   HR: 80   FiO2 (lpm) 0   Minute 1:   SpO2: 97   HR: 92   FiO2 (lpm) 0   Laps: 0   Minute 2:   SpO2: 96   HR: 101   FiO2 (lpm) 0   Laps: 0   Minute 3:   SpO2: 97   HR: 103   FiO2 (lpm) 0   Laps: 0   Minute 4:   SpO2: 97   HR: 108   FiO2 (lpm) 0   Laps: 0   Minute 5:   SpO2: 98   HR: 105   FiO2 (lpm) 0   Laps: 0   Minute 6:   SpO2: 98   HR: 104   FiO2 (lpm) 0   Laps: 8   Recovery 1 Minute:   SpO2: 98   HR: 92   Recovery 2 Minutes:   SpO2: 98   HR: 79   Lowest Sat and Total Laps:   Lowest Sat: 96   Total Laps: 8   Post Walk:   Post - Borg (Dyspnea): 0   Post - Borg (Fatigue): 2   PBL taught/encouraged during walk Yes   Stopped or Paused before 6 Minutes: No   Symptoms: none   Oxygen used during walk: No   Assistive Device: None   Total Distance Walked   Feet (80/88/100/200) 200   Additional Feet: 10   Total Distance Walked (feet): 1610   Total Distance Walked (meters): 490.73   MPH 3.05   METS 3.34

## 2023-02-02 ENCOUNTER — Institutional Professional Consult (permissible substitution)
Admit: 2023-02-02 | Discharge: 2023-02-02 | Payer: PRIVATE HEALTH INSURANCE | Attending: Clinical & Laboratory Immunology

## 2023-02-02 DIAGNOSIS — J3089 Other allergic rhinitis: Secondary | ICD-10-CM

## 2023-02-02 NOTE — Progress Notes (Signed)
Patient hasn't had any reactions from their allergy injections. Doing well with their Immunotherapy. Mold and pollens avoidence guidelines were discussed. Patient was advised to continue their nasal rinsing and stay inside when mold and pollen counts were high. Patient was shone their allergy vials to verify their name and date of birth.

## 2023-02-08 ENCOUNTER — Ambulatory Visit: Payer: PRIVATE HEALTH INSURANCE | Attending: Acute Care

## 2023-02-09 ENCOUNTER — Institutional Professional Consult (permissible substitution)
Admit: 2023-02-09 | Discharge: 2023-02-09 | Payer: PRIVATE HEALTH INSURANCE | Attending: Clinical & Laboratory Immunology

## 2023-02-09 ENCOUNTER — Ambulatory Visit: Payer: PRIVATE HEALTH INSURANCE | Attending: Acute Care

## 2023-02-09 DIAGNOSIS — J3089 Other allergic rhinitis: Secondary | ICD-10-CM

## 2023-02-09 NOTE — Progress Notes (Deleted)
Initial Outpatient Pulmonary Medicine Consultation     REASON FOR PULMONARY CONSULT:  No chief complaint on file.      HPI  Mary [name] is a 52 y.o. female who presents for No chief complaint on file.    Hx of asthma, last seen in 2016. Hx of chronic rhinosinusitis and environmental allergies followed by Dr. Neldon Mc on immune therapy. Pt referred by PCP for asthma management. Currently on symbicort, albuterol, singulair. Previously on high dose advair and it helped more but insurance would not cover it. Had sinus surgery in 02/2022. No admissions for asthma in the last year. ER visit for asthma in May 2022. Has not been on steroids for a while, hates the way they make her feel. SOB is constant, makes it difficult for her to do her daily activities. Had to give up tennis because of her asthma. Has not noticed any improvement in her symptoms since sinus surgery or allergy immune therapy. No significant cough. Has reflux a few days a month. Has woken up from sleep with reflux. Excessive snoring, has to sleep in a different room than her husband. Does not have a consistent sleep schedule. Sometimes difficulty sleeping at night then sleeps during the late morning. Can fall asleep at 11am and nap  until 3p. Falls asleep easily watching tv. No sleep walking. Sleep talking or paralysis. Thinks she may have had a sleep study in the past but does not think she slept. Has some palpitations off and on. Does have two hypoallergenic dogs at home. Has always had dogs. The hypoallergenic dogs she has had for 5yrs and did not affect her asthma symptoms. Changes air filters regularly    Previous Note (08/06/15):  Hx of asthma on symbicort recently increased to 2 puffs bid by PCP Dr. Thedore Mins. Has had asthma all of her life, diagnosed as an infant. Has been hospitalized for asthma and pneumonia at age 21 and 68. Moved to [place] about 78yrs ago and asthma symptoms have been worse over last 7yrs. Used to be on advair and symptoms were  much better controlled. Uses albuterol everyday good day 2 times a day, bad day 5 times a day or more.     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     AUGMENTATION MAMMAPLASTY  age 54-24    [place], [place]    BIOPSY BREAST NEEDLE LOCALIZATION Right 06/02/2016    Procedure: RIGHT BREAST NEEDLE LOCALIZED EXCISIONAL BIOPSY OF 2 AREAS,   ;  Surgeon: Cristal Generous, MD;  Location: [facility] OR;  Service: General;  Laterality: Right;    BREAST EXCISIONAL BIOPSY      atypical cells    CERVICAL CONE BIOPSY  COSMETIC SURGERY      ENDOMETRIAL BIOPSY      benign polyp    FUNCTONAL ENDO SINUS SURGERY WITH LANDMARK Bilateral 02/03/2022    Procedure: endoscopic sinus surgery with septoplasty and inferior turbinate reductions.;  Surgeon: [name], MD;  Location: [facility] OR;  Service: ENT;  Laterality: Bilateral;    HYSTEROSCOPY ABLATION ENDOMETRIAL N/A 06/02/2016    Procedure: HYSTEROSCOPIC RESECTION OF ENDOMETRIAL POLYP;  Surgeon: Raylene Everts;  Location: [facility] OR;  Service: Gynecology;  Laterality: N/A;    uterine biopsy       x2       CURRENT HOME MEDICATIONS  Current Outpatient Medications   Medication Sig    albuterol Inhale 2 puffs into the lungs every 6 hours as needed for Wheezing.    azelastine Use 1 spray into each nostril 2 times a day. Use in each nostril as directed    cetirizine Take 1 tablet (10 mg total) by mouth daily as needed for Allergies.    cholecalciferol (vitamin D3) Take by mouth.     dextroamphetamine-amphetamine Take 1 tablet (20 mg total) by mouth in the morning and at bedtime for 30 days.    dextroamphetamine-amphetamine Take 1 tablet (20 mg total) by mouth in the morning and at bedtime for 30 days.    dextroamphetamine-amphetamine Take 1 tablet (20 mg total) by mouth in the morning and at bedtime for 30 days.    escitalopram oxalate Take 1 tablet (20 mg total) by mouth daily.    FERROUS SULFATE (IRON ORAL) Take by mouth.    fluticasone propionate SHAKE LIQUID AND USE 1 TO 2 SPRAYS IN EACH NOSTRIL DAILY AS NEEDED    Trelegy Ellipta Inhale 1 puff into the lungs daily.    montelukast Take 1 tablet (10 mg total) by mouth at bedtime.    omeprazole Take 1 capsule (40 mg total) by mouth every morning before breakfast.    predniSONE 1 by mouth twice daily x 3 days then 1 by mouth once daily x 4 days (Patient taking differently: 1 by mouth twice daily x 3 days then 1 by mouth once daily x 4 days  As needed)     No current facility-administered medications for this visit.       ALLERGIES  No Known Allergies    SOCIAL HISTORY  Social History     Socioeconomic History    Marital status: Married     Spouse name: Not on file    Number of children: Not on file    Years of education: Not on file    Highest education level: Not on file   Occupational History    Not on file   Tobacco Use    Smoking status: Never    Smokeless tobacco: Never    Tobacco comments:     02/24/2011; passive smoke exposure:no (06/18/2005)   Vaping Use    Vaping Use: Never used   Substance and Sexual Activity    Alcohol use: Yes     Alcohol/week: 4.0 standard drinks of alcohol     Types: 4 Glasses of wine per week     Comment: socially    Drug use: No     Comment: 01/07/2011    Sexual activity: Yes     Partners: Female   Other Topics Concern    Caffeine Use Yes    Occupational Exposure No    Exercise No    Seat Belt Yes   Social History Narrative    Currently  not working. Lives in a newer home with husband and 2 kids, no known mold in  the home, no pets at home, no occupational exposures.      Social Determinants of Health     Food Insecurity: No Food Insecurity (11/17/2022)    Yearly Questionnaire     Do you need any assistance with obtaining housing, meals, medication, transportation or medical equipment?: No     Assistance needed for:: Not on file   Transportation Needs: No Transportation Needs (11/17/2022)    Yearly Questionnaire     Do you need any assistance with obtaining housing, meals, medication, transportation or medical equipment?: No     Assistance needed for:: Not on file   Intimate Partner Violence: Not on file   Housing Stability: Low Risk  (11/17/2022)    Yearly Questionnaire     Do you need any assistance with obtaining housing, meals, medication, transportation or medical equipment?: No     Assistance needed for:: Not on file         FAMILY HISTORY  Family History   Problem Relation Age of Onset    Uterine Cancer Mother 43        uterine vs cervical     Hypertension Father     Asthma Father     Alcohol abuse Father     Lung Cancer Father     Bone Cancer Paternal Grandmother 57    Asthma Other     Allergies Other     Melanoma Neg Hx     Breast Cancer Neg Hx     Ovarian cancer Neg Hx     Colon Cancer Neg Hx     Pancreatic Cancer Neg Hx     Prostate Cancer Neg Hx     Anesthesia problems Neg Hx        ROS:  CONSTITUTIONAL: No fever/chills or night sweats, no recent weight change.  No sick contacts.  HENT:  No epistaxsis, chronic nasal discharge, no sore throat.  CARDIAC: No chest pains or palpitations, no lower extremity edema, no orthopnea, or PND.  RESPIRATORY: see HPI  GI:  No dysphagia, occ heartburn, no melena, no hematochezia.  MUSCULOSKELETAL: No joint pain.  No Raynaud's Phenomenon.  SKIN: No rashes or lesions.  NEURO: No morning headaches, syncope, or TIA symptoms.  PSYCHIATRIC: No depression or anxiety.  Sleep:  see HPI    PHYSICAL EXAM:  VITAL SIGNS:   There were no vitals filed for this visit.    Wt Readings from Last 3  Encounters:   02/01/23 171 lb (77.6 kg)   12/08/22 171 lb (77.6 kg)   04/08/22 165 lb (74.8 kg)     GEN: No respiratory distress, alert and oriented x3  HEENT: Atraumatic, normocephalic, pupils equal reactive to light, extraocular movements intact, sclera anicteric, no maxillary or frontal sinus tenderness to palpation, mild nasal mucosal erythema/edema, mucous membranes moist, no oral lesions. Mallampati grade 3.  NECK:  supple, no masses, no lymphadenopathy  CARDIOVASCULAR: Regular Rate and Rhythm, no murmurs gallops or rubs. Radial pulse 2+ and equal.   LUNGS: Clear to ausculation bilaterally, no wheeze, no rhonchi, no rales, no egophony, no dullness to percussion  ABDOMEN: Soft, non-tender, nondistended, bowel sounds present  SKIN: Warm, dry  EXTREMITIES: No clubbing, no pitting edema.  NEURO: CN 2-12 grossly intact. No gross focal deficits.    PSYCH: Normal affect and mood.    Data:    07/29/15 PFT  Pulmonary Function Tests Interpretation  Obstructive defect is present.  Spirometry shows moderately severe obstructive defect.  There is a significant response to bronchodilator demonstrated.  Air trapping is present.  Diffusing capacity is elevated and may suggest asthma.      6 Minute Walk    Total Distance Walked:  2050 feet  Baseline SpO2:  97     Lowest SpO2:  96  Oximetry with 6 minute walk test shows no desaturation with submaximal exercise.  Read performed by:    02/27/21 CXR  No acute disease    09/30/17 Eos 182, 09/12/15 Eos 239    06/25/15 IgE 272 elevated    12/29/21 Sinus CT  1.  No significant paranasal sinus disease.   2.  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   3.  Multilevel cervical facet arthrosis with degenerative subluxations discussed above.   4.  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 If potentially of clinical relevance, MRI IAC protocol without and with contrast would be helpful  for further characterization. Stability since 2021 would make an aggressive or malignant process unlikely.               IMPRESSION:   Moderate persistent asthma  Chronic sinusitis  Allergies  Palpitations  Excessive somnolence    PLAN:    Cont ICS/LABA will add trial of LAMA  Cont albuterol as needed  Cont singulair, zyrtec, flonase  Trial omeprazole  Home sleep study  Echo  Repeat PFT  CXR  Follow up for results

## 2023-02-09 NOTE — Progress Notes (Signed)
Patient hasn't had any reactions from their allergy injections. Doing well with their Immunotherapy. Mold and pollens avoidence guidelines were discussed. Patient was advised to continue their nasal rinsing and stay inside when mold and pollen counts were high. Patient was shone their allergy vials to verify their name and date of birth.

## 2023-02-14 MED ORDER — montelukast (SINGULAIR) 10 mg tablet
10 | ORAL_TABLET | Freq: Every evening | ORAL | 3 refills | Status: AC
Start: 2023-02-14 — End: ?

## 2023-02-16 ENCOUNTER — Ambulatory Visit: Payer: PRIVATE HEALTH INSURANCE

## 2023-02-22 ENCOUNTER — Ambulatory Visit: Admit: 2023-02-22 | Payer: PRIVATE HEALTH INSURANCE

## 2023-02-22 DIAGNOSIS — Z1231 Encounter for screening mammogram for malignant neoplasm of breast: Secondary | ICD-10-CM

## 2023-02-22 DIAGNOSIS — F988 Other specified behavioral and emotional disorders with onset usually occurring in childhood and adolescence: Secondary | ICD-10-CM

## 2023-02-22 MED ORDER — dextroamphetamine-amphetamine (ADDERALL) 20 mg Tab
20 | ORAL_TABLET | Freq: Two times a day (BID) | ORAL | 0 refills | Status: AC
Start: 2023-02-22 — End: 2023-04-23

## 2023-02-22 MED ORDER — dextroamphetamine-amphetamine (ADDERALL) 20 mg Tab
20 | ORAL_TABLET | Freq: Two times a day (BID) | ORAL | 0 refills | Status: AC
Start: 2023-02-22 — End: 2023-05-23

## 2023-02-22 MED ORDER — dextroamphetamine-amphetamine (ADDERALL) 20 mg Tab
20 | ORAL_TABLET | Freq: Two times a day (BID) | ORAL | 0 refills | Status: AC
Start: 2023-02-22 — End: 2023-03-24

## 2023-02-22 NOTE — Patient Instructions (Signed)
Encounter Diagnoses   Name Primary?    Screening mammogram for breast cancer     Attention deficit disorder, unspecified hyperactivity presence  - stable  - continue with the current treatment   Yes

## 2023-02-22 NOTE — Progress Notes (Unsigned)
Subjective   HPI:   Patient ID: Mary [name] is a 52 y.o. female.    The following HPI was reviewed and copied forward (with edits) from a note written by me on 11/17/22. I have reviewed and updated the history, physical exam, data, assessment, and plan of the note as detailed below so that it reflects my evaluation and management of the patient.       The patient's chart was reviewed prior to the visit as part of pre-visit planning  No chief complaint on file.     ADD, asthma and needs referrals  Asthma: no fu w pulm, was seen thru [facility], symptoms are not well controlled esp w allergies  A/I concerns: symptoms worse since entering perimenopause; feel always sick, losing hair; past ESR was a little high, had neg ANA, Rheum factor; no fhx v known A/I disorders; wondering about seeing rheum     Past: AXR 40mg  once daily  b/o supply issues past several months has been on SA form 20mg  bid (am and pm), concentration is doing ok (past did better w 45mg  but $$$ issues)    Is keen to reduce dose and eventually dc   Denies any s/e   Denies diversion or st/drug abuse   Has rec'ed [facility] for MM, I have only gone once, using for sleep 2x/week: vape works better than the edible altho it does impact her asthma too much          09/30/2020     1:12 PM 01/20/2021     2:32 PM 04/22/2021     2:46 PM 10/27/2021     1:42 PM 01/28/2022     4:21 PM 04/29/2022     1:53 PM 06/25/2022     4:05 PM   Controlled Substance Monitoring   OARRS/eKASPER Status Reviewed Reviewed Reviewed Reviewed Reviewed Reviewed Reviewed   OARRS/eKASPER Consistent with Prescriber Expectation [name]     I have completed the required OARRS/eKASPER documentation for this patient on 02/21/2023.  Vir Whetstine [name]      Past Medical History:   Diagnosis Date    ADD (attention deficit disorder)     Allergy     Anemia     Anxiety     Asthma     Cancer (CMS-HCC)     Cervical dysplasia     Chronic back pain     Depression     Diverticulitis     Environmental allergies     Iron  deficiency     Varicella     Vitamin D deficiency        Current Outpatient Medications   Medication Sig Dispense Refill    albuterol (PROAIR HFA) 90 mcg/actuation Inhl inhaler Inhale 2 puffs into the lungs every 6 hours as needed for Wheezing. 8 g 11    azelastine (ASTELIN) 137 mcg (0.1 %) nasal spray Use 1 spray into each nostril 2 times a day. Use in each nostril as directed 30 mL 1    cetirizine (ZYRTEC) 10 MG tablet Take 1 tablet (10 mg total) by mouth daily as needed for Allergies.      cholecalciferol, vitamin D3, 125 mcg (5,000 unit) Tab Take by mouth.      dextroamphetamine-amphetamine (ADDERALL) 20 mg Tab Take 1 tablet (20 mg total) by mouth in the morning and at bedtime for 30 days. 60 tablet 0    dextroamphetamine-amphetamine (ADDERALL) 20 mg Tab Take 1 tablet (20 mg total) by  mouth in the morning and at bedtime for 30 days. 60 tablet 0    dextroamphetamine-amphetamine (ADDERALL) 20 mg Tab Take 1 tablet (20 mg total) by mouth in the morning and at bedtime for 30 days. 60 tablet 0    escitalopram oxalate (LEXAPRO) 20 MG tablet Take 1 tablet (20 mg total) by mouth daily. 90 tablet 3    FERROUS SULFATE (IRON ORAL) Take by mouth.      fluticasone (FLONASE) 50 mcg/actuation nasal spray SHAKE LIQUID AND USE 1 TO 2 SPRAYS IN EACH NOSTRIL DAILY AS NEEDED 16 g 0    fluticasone-umeclidin-vilanter (TRELEGY ELLIPTA) 200-62.5-25 mcg DsDv Inhale 1 puff into the lungs daily. 60 each 5    montelukast (SINGULAIR) 10 mg tablet take 1 tablet by mouth at bedtime 90 tablet 3    omeprazole (PRILOSEC) 40 MG capsule Take 1 capsule (40 mg total) by mouth every morning before breakfast. 30 capsule 0    predniSONE (DELTASONE) 20 MG tablet 1 by mouth twice daily x 3 days then 1 by mouth once daily x 4 days (Patient taking differently: 1 by mouth twice daily x 3 days then 1 by mouth once daily x 4 days  As needed) 10 tablet 0     No current facility-administered medications for this visit.          ROS:   Review of Systems  as above    Objective:   Physical Exam      Wt Readings from Last 3 Encounters:   02/01/23 171 lb (77.6 kg)   12/08/22 171 lb (77.6 kg)   04/08/22 165 lb (74.8 kg)       There were no vitals filed for this visit.  There is no height or weight on file to calculate BMI.  There is no height or weight on file to calculate BSA.    NAD  Psych: A&Ox3; reactive affect; no obv thought d/o       Lab Results   Component Value Date    WBC 4.3 12/22/2022    HGB 12.9 12/22/2022    HCT 37.7 12/22/2022    MCV 87.7 12/22/2022    PLT 274 12/22/2022       Lab Results   Component Value Date    HGBA1C 5.8 (H) 12/22/2022    HGBA1C 5.9 (H) 10/01/2020     Lab Results   Component Value Date    CREATININE 0.72 12/22/2022       Lab Results   Component Value Date    GLUCOSE 101 (H) 12/22/2022    BUN 11 12/22/2022    CO2 26 12/22/2022    CREATININE 0.72 12/22/2022    K 4.2 12/22/2022    NA 138 12/22/2022    CL 100 12/22/2022    CALCIUM 9.3 12/22/2022    ALBUMIN 4.0 12/22/2022    PROT 7.2 12/22/2022    ALKPHOS 43 12/22/2022    ALT 11 12/22/2022    AST 30 09/30/2017    BILITOT 1.3 12/22/2022       Lab Results   Component Value Date    CHOLTOT 251 (H) 12/22/2022    TRIG 164 (H) 12/22/2022    HDL 75 12/22/2022    LDL 143 12/22/2022       Lab Results   Component Value Date    TSH 3.32 12/22/2022     Lab Results   Component Value Date    VITAMINB12 >1506 (H) 10/01/2020  Upcoming Health Maintenance       Comprehensive Physical Exam (Yearly)  Never done    Immunization: DTaP/Tdap/Td (1 - Tdap)  Never done    Immunization: Hepatitis B (1 of 3 - 19+ 3-dose series)  Never done    Immunization: Pneumococcal (2 of 2 - PCV)  Overdue since 10/04/2009    Immunization: Zoster (1 of 2)  Never done    Cervical Cancer Screening/Pap Smear (MyChart) (Every 3 Years)  Overdue since 05/13/2020    Depression Monitoring (PHQ-9) (Every 3 Months)  Overdue since 04/21/2021    Mammogram (MyChart) (Every 2 Years)  Overdue since 08/20/2022    Alcohol Misuse Screening (Yearly)   Overdue since 10/27/2022    Colorectal Cancer Screening (MyChart) (Cologuard (FIT-DNA) - Every 3 Years)  Overdue since 12/04/2022    Immunization: Influenza (MyChart) (Season Ended)  Next due on 06/05/2023    Diabetes Screening (Every 3 Years)  Next due on 12/21/2025           Immunization History   Administered Date(s) Administered    COVID-19, mRNA, Moderna monovalent, age 25+ 04/20/2021, 05/18/2021    Influenza, quadrivalent, intradermal, preservative-free 07/04/2017    Influenza, quadrivalent, preservative-free 08/06/2015, 06/25/2016, 09/11/2018, 09/30/2020    Influenza, unspecified 05/31/2008    Pneumococcal polysaccharide, 23-valent 10/04/2008         Assessment/Plan:     This was a {video/phone:2100587}, in lieu of an in-person visit. The patient provided verbal consent to participate in the telehealth visit.   I spent {minutes spent:2100589} minutes speaking with the patient, conducting an interview, performing a limited exam, and educating the patient on my assessment and plan. I also spent {minutes spent:2100589} minutes, on the same day as the encounter, {encounter details:2100588}.       A/P:  There are no Patient Instructions on file for this visit.     RTC: 3 months    New meds:   none  Patient education given  none  Patient verbalized understanding & agreement of the proposed plan.

## 2023-02-23 ENCOUNTER — Ambulatory Visit: Payer: PRIVATE HEALTH INSURANCE

## 2023-03-02 ENCOUNTER — Institutional Professional Consult (permissible substitution): Admit: 2023-03-02 | Discharge: 2023-03-02 | Payer: PRIVATE HEALTH INSURANCE

## 2023-03-02 DIAGNOSIS — J3089 Other allergic rhinitis: Secondary | ICD-10-CM

## 2023-03-02 NOTE — Progress Notes (Signed)
Patient Verified Name and DOB; patient shown no reaction to previous injection; patient show no immediate reaction to current injection.      Patient is on Maintenance         Right arm: 0.20  Left Arm: 0.20    Applied benadryl to injection site       Mary [name]

## 2023-03-09 ENCOUNTER — Institutional Professional Consult (permissible substitution): Admit: 2023-03-09 | Discharge: 2023-03-09 | Payer: PRIVATE HEALTH INSURANCE

## 2023-03-09 DIAGNOSIS — J3089 Other allergic rhinitis: Secondary | ICD-10-CM

## 2023-03-09 NOTE — Progress Notes (Signed)
Patient Verified Name and DOB; patient shown no reaction to previous injection; patient show no immediate reaction to current injection.          Patient is Escalating    Right arm: 0.25   Left Arm: 0.25      Patient reported no issues from last injection did inform patient if any issues to reach out     Mary [name]

## 2023-03-16 ENCOUNTER — Institutional Professional Consult (permissible substitution): Admit: 2023-03-16 | Discharge: 2023-03-16 | Payer: PRIVATE HEALTH INSURANCE

## 2023-03-16 DIAGNOSIS — J3089 Other allergic rhinitis: Secondary | ICD-10-CM

## 2023-03-16 NOTE — Progress Notes (Signed)
Patient hasn't had any reactions from their allergy injections. Doing well with their Immunotherapy. Mold and pollens avoidence guidelines were discussed. Patient was advised to continue their nasal rinsing and stay inside when mold and pollen counts were high. Patient was shone their allergy vials to verify their name and date of birth.

## 2023-03-23 ENCOUNTER — Ambulatory Visit: Payer: PRIVATE HEALTH INSURANCE

## 2023-03-30 ENCOUNTER — Institutional Professional Consult (permissible substitution): Admit: 2023-03-30 | Discharge: 2023-03-30 | Payer: PRIVATE HEALTH INSURANCE

## 2023-03-30 DIAGNOSIS — J3089 Other allergic rhinitis: Secondary | ICD-10-CM

## 2023-03-30 NOTE — Progress Notes (Signed)
Extract #1 Escalating: DM, CT, DG, M  Dose: 0.35 ml  Site: right arm  Administered by: C. Garland Smouse RN    Extract #2 Escalating : TR, GR, W  Dose: 0.35 ml  Site: left arm  Administered by: C. Elyn Krogh RN    No noted reactions from the previous injection. The patient denies any fever or any other symptoms from the last 24 hours.     Epinephrine injector on hand today.

## 2023-03-31 ENCOUNTER — Inpatient Hospital Stay: Admit: 2023-03-31 | Discharge: 2023-04-04 | Payer: PRIVATE HEALTH INSURANCE

## 2023-03-31 DIAGNOSIS — Z1231 Encounter for screening mammogram for malignant neoplasm of breast: Secondary | ICD-10-CM

## 2023-04-06 ENCOUNTER — Ambulatory Visit: Payer: PRIVATE HEALTH INSURANCE

## 2023-04-13 ENCOUNTER — Institutional Professional Consult (permissible substitution)
Admit: 2023-04-13 | Discharge: 2023-04-13 | Payer: PRIVATE HEALTH INSURANCE | Attending: Clinical & Laboratory Immunology

## 2023-04-13 DIAGNOSIS — J3089 Other allergic rhinitis: Secondary | ICD-10-CM

## 2023-04-13 NOTE — Progress Notes (Signed)
Patient had avery lage local reaction to vial #1. Mold and pollens avoidence guidelines were discussed. Patient was advised to continue their nasal rinsing and stay inside when mold and pollen counts were high. Patient was shone their allergy vials to verify their name and date of birth.

## 2023-04-20 ENCOUNTER — Ambulatory Visit: Payer: PRIVATE HEALTH INSURANCE

## 2023-04-27 ENCOUNTER — Ambulatory Visit: Payer: PRIVATE HEALTH INSURANCE

## 2023-05-04 ENCOUNTER — Ambulatory Visit: Payer: PRIVATE HEALTH INSURANCE

## 2023-05-11 ENCOUNTER — Institutional Professional Consult (permissible substitution): Admit: 2023-05-11 | Discharge: 2023-05-11 | Payer: PRIVATE HEALTH INSURANCE

## 2023-05-11 DIAGNOSIS — J3089 Other allergic rhinitis: Secondary | ICD-10-CM

## 2023-05-11 NOTE — Progress Notes (Addendum)
Extract #1 Escalating  Dose: 0.26ml decreased dose due to time lapse  Site: right arm  Administered by: M. Myreon Wimer LPN     Extract #2 Escalating  Dose: 0.51ml decreased dose due to time lapse  Site: left arm  Administered by: M. Hendrick Pavich LPN    No noted reactions from the previous injection. The patient denies any fever or any other symptoms from the last 24 hours.     Epinephrine injector on hand today.

## 2023-05-18 ENCOUNTER — Institutional Professional Consult (permissible substitution): Admit: 2023-05-18 | Discharge: 2023-05-18 | Payer: PRIVATE HEALTH INSURANCE

## 2023-05-18 DIAGNOSIS — J3089 Other allergic rhinitis: Secondary | ICD-10-CM

## 2023-05-18 NOTE — Progress Notes (Signed)
Extract #1 Escalating  Dose: 0.75ml  Site: right arm      Extract #2 Escalating  Dose: 0.51ml  Site: left arm  Administered by: M. Asaiah Hunnicutt LPN    No noted reactions from the previous injection. The patient denies any fever or any other symptoms from the last 24 hours.     Epinephrine injector on hand today.

## 2023-05-25 ENCOUNTER — Institutional Professional Consult (permissible substitution): Admit: 2023-05-25 | Discharge: 2023-05-25 | Payer: PRIVATE HEALTH INSURANCE

## 2023-05-25 DIAGNOSIS — J3089 Other allergic rhinitis: Secondary | ICD-10-CM

## 2023-05-25 MED ORDER — dextroamphetamine-amphetamine (ADDERALL) 20 mg Tab
20 | ORAL_TABLET | Freq: Two times a day (BID) | ORAL | 0 refills | Status: AC
Start: 2023-05-25 — End: 2023-06-24

## 2023-05-25 NOTE — Progress Notes (Signed)
Extract #1 Escalating  Dose: 0.77ml  Site: right arm      Extract #2 Escalating  Dose: 0.3ml  Site: left arm  Administered by: M. Sonika Levins LPN    No noted reactions from the previous injection. The patient denies any fever or any other symptoms from the last 24 hours.     Epinephrine injector on hand today.

## 2023-05-31 NOTE — Progress Notes (Signed)
[facility]  ALLERGY TREATMENT VIAL WORKSHEET ENVIRONMENTAL  NAME: Mary [name], Mary [name] DOB: 08/26/1971 DR. SEDAGHAT DATE: 05/31/23  EXTRACT#1 START DATE: 11/2020  DUST MITES: End Point: Vial Dilution: Volume: ml   D. Farinae 4 Concentrate 0.2   D. Pteronyssinus 4 Concentrate 0.2   ANIMALS:      Cockroach 3 Not treated ---   Cat 5 Concentrate 0.2   Dog 3 Concentrate 0.2   MOLDS      *Alternaria 4 Concentrate 0.1   *Aspergillus 6 Concentrate 0.1   Helminthosporium 3 Not treated ---   Hormodendrum 3 Not treated ---   Candida Albicans 3 Not treated ---   Penicillium notatum 3 Not treated ---   Fusarium Oxysporium 5 Concentrate 0.2   Epicoccum 5 Concentrate 0.2   Pullularia 4 Concentrate 0.2   Rhizopus 3 Not treated ---   Total Volume of Antigens:  1.6 ml   Total Volume of Diluent:  3.4 ml   Total Volume:  5.0 ml   EXTRACT#2 START DATE: 11/2020  TREES      Cedar 5 Concentrate 0.2   Birch 6 Concentrate 0.2   Ash 5 Not treated ---   Pecan/ Hickory 5 Concentrate 0.2   Elm 6 Not treated ---   Box Elder 3 Not treated ---   Oak 5 Concentrate 0.2   Cottonwood 4 Not treated ---   GRASSES      Johnson Grass 5 Not treated ---   Marcial Pacas Grass 5 Concentrate 0.2   [place] Grass 4 Concentrate 0.2   WEEDS      English Plantain 4 Concentrate 0.2   Ragweed 6 Concentrate 0.2   Lambs quarter 3 Not treated ---   Pigweed 3 Not treated ---   Sorrel 3 Not treated ---   Wormwood 5 Concentrate 0.2   Total Volume of Antigens: 1.8 ml   Total Volume of Diluent: 3.2 ml     Total Volume:  5.0 ml   Prepared by: C. Padraic Marinos RN, BSN   05/31/23   Charges generated on paper invoice.    Extract lot numbers [id number]    D.Luanne Bras: [id number]  EXP: 01/01/2026  D.Pteronyssinus: [id number] EXP: 04/06/2025  Cat: [id number] EXP: 10/31/2024  Dog Hair: [id number] EXP: 12/27/2025    Alternaria: [id number] EXP: 11/01/2025  Aspergillus: [id number] EXP: 11/01/2025  Fusarium: [id number] EXP: 01/10/2025  Epiccocum: [id number] EXP: 11/01/2025  Pullaria: [id number] EXP:  11/04/2023    Cedar: [id number] EXP: 07/26/2025  Charletta Cousin, River: [id number] EXP: 02/20/2026  Pecan/Hickory: [id number] EXP: 02/20/2026  Lafe Garin Red: [id number] EXP: 03/20/2026    English Plantain: [id number] EXP: 12/27/2025  Ragweed Short/Giant: [id number] EXP: 06/12/2025  Wormwood: [id number] EXP: 07/26/2025    Juliann Mule: [id number] EXP: 12/26/2024  [place] Grass: (10) [id number] EXP: 02/13/2025

## 2023-06-01 ENCOUNTER — Ambulatory Visit: Payer: PRIVATE HEALTH INSURANCE

## 2023-06-08 ENCOUNTER — Institutional Professional Consult (permissible substitution): Admit: 2023-06-08 | Discharge: 2023-06-08 | Payer: PRIVATE HEALTH INSURANCE

## 2023-06-08 DIAGNOSIS — J3089 Other allergic rhinitis: Secondary | ICD-10-CM

## 2023-06-08 NOTE — Progress Notes (Signed)
Extract #1 Escalating: DM, CT, DG, M  Dose: 0.30 ml  Site: right arm  Administered by: C. Squire Withey RN    Extract #2 Escalating: TR, GR, W  Dose: 0.30 ml  Site: left  arm  Administered by: C. Fitz Matsuo RN    Time lapse 2 weeks, repeated last dose.    No noted reactions from the previous injection. The patient denies any fever or any other symptoms from the last 24 hours.     Epinephrine injector on hand today.

## 2023-06-14 NOTE — Telephone Encounter (Signed)
Can we do a video visit this Friday @ 1045?

## 2023-06-14 NOTE — Telephone Encounter (Signed)
Patient called into the office to schedule a medication refill video appointment with Dr. Thedore Mins    Patient states she will be out of medication in a little over two weeks     Next open slot is 10/21            Please advise  [phone]          Thank you!!!

## 2023-06-14 NOTE — Telephone Encounter (Signed)
Lvmtcb

## 2023-06-14 NOTE — Telephone Encounter (Signed)
Patient scheduled.

## 2023-06-15 ENCOUNTER — Institutional Professional Consult (permissible substitution): Admit: 2023-06-15 | Discharge: 2023-06-15 | Payer: PRIVATE HEALTH INSURANCE

## 2023-06-15 DIAGNOSIS — J3089 Other allergic rhinitis: Secondary | ICD-10-CM

## 2023-06-15 NOTE — Progress Notes (Signed)
Extract #1 Escalating: DM, CT, DG, M  Dose: 0.35 ml  Site: right arm  Administered by: C. Garland Smouse RN    Extract #2 Escalating : TR, GR, W  Dose: 0.35 ml  Site: left arm  Administered by: C. Elyn Krogh RN    No noted reactions from the previous injection. The patient denies any fever or any other symptoms from the last 24 hours.     Epinephrine injector on hand today.

## 2023-06-17 ENCOUNTER — Ambulatory Visit: Admit: 2023-06-17 | Discharge: 2023-06-30 | Payer: PRIVATE HEALTH INSURANCE

## 2023-06-17 DIAGNOSIS — F988 Other specified behavioral and emotional disorders with onset usually occurring in childhood and adolescence: Secondary | ICD-10-CM

## 2023-06-17 MED ORDER — dextroamphetamine-amphetamine (ADDERALL) 20 mg Tab
20 | ORAL_TABLET | Freq: Two times a day (BID) | ORAL | 0 refills | Status: AC
Start: 2023-06-17 — End: 2023-07-17

## 2023-06-17 MED ORDER — dextroamphetamine-amphetamine (ADDERALL) 20 mg Tab
20 | ORAL_TABLET | Freq: Two times a day (BID) | ORAL | 0 refills | Status: AC
Start: 2023-06-17 — End: 2023-08-16

## 2023-06-17 MED ORDER — dextroamphetamine-amphetamine (ADDERALL) 20 mg Tab
20 | ORAL_TABLET | Freq: Two times a day (BID) | ORAL | 0 refills | Status: AC
Start: 2023-06-17 — End: 2023-09-15

## 2023-06-17 NOTE — Patient Instructions (Signed)
Encounter Diagnoses   Name Primary?         Attention deficit disorder, unspecified hyperactivity presence  - stable  - continue with the current treatment   Yes

## 2023-06-17 NOTE — Progress Notes (Signed)
Subjective   HPI:   Patient ID: Mary [name] is a 52 y.o. female.    The following HPI was reviewed and copied forward (with edits) from a note written by me on 02/22/23. I have reviewed and updated the history, physical exam, data, assessment, and plan of the note as detailed below so that it reflects my evaluation and management of the patient.       The patient's chart was reviewed prior to the visit as part of pre-visit planning  No chief complaint on file.     ADD,     Past: AXR 40mg  once daily  Current Adderall SA form 20mg  bid (am and pm), concentration is doing ok (past did better w 45mg  but $$$ issues)   Denies any s/e   Denies diversion or st/drug abuse   Has rec'ed CTR for MM          04/22/2021     2:46 PM 10/27/2021     1:42 PM 01/28/2022     4:21 PM 04/29/2022     1:53 PM 06/25/2022     4:05 PM 02/22/2023     2:36 PM 06/17/2023    10:38 AM   Controlled Substance Monitoring   OARRS/eKASPER Status Reviewed Reviewed Reviewed Reviewed Reviewed Reviewed Reviewed   OARRS/eKASPER Consistent with Prescriber Expectation [name]     I have completed the required OARRS/eKASPER documentation for this patient on 06/17/2023.  Amarrah Meinhart [name], MD      Past Medical History:   Diagnosis Date    ADD (attention deficit disorder)     Allergy     Anemia     Anxiety     Asthma     Cancer (CMS-HCC)     Cervical dysplasia     Chronic back pain     Depression     Diverticulitis     Environmental allergies     Iron deficiency     Varicella     Vitamin D deficiency        Current Outpatient Medications   Medication Sig Dispense Refill    dextroamphetamine-amphetamine (ADDERALL) 20 mg Tab Take 1 tablet (20 mg total) by mouth in the morning and at bedtime for 30 days. 60 tablet 0    [START ON 07/17/2023] dextroamphetamine-amphetamine (ADDERALL) 20 mg Tab Take 1 tablet (20 mg total) by mouth in the morning and at bedtime for 30 days. 60 tablet 0    [START ON 08/16/2023] dextroamphetamine-amphetamine (ADDERALL) 20 mg Tab Take 1  tablet (20 mg total) by mouth in the morning and at bedtime for 30 days. 60 tablet 0    albuterol (PROAIR HFA) 90 mcg/actuation Inhl inhaler Inhale 2 puffs into the lungs every 6 hours as needed for Wheezing. 8 g 11    azelastine (ASTELIN) 137 mcg (0.1 %) nasal spray Use 1 spray into each nostril 2 times a day. Use in each nostril as directed 30 mL 1    cetirizine (ZYRTEC) 10 MG tablet Take 1 tablet (10 mg total) by mouth daily as needed for Allergies.      cholecalciferol, vitamin D3, 125 mcg (5,000 unit) Tab Take by mouth.      escitalopram oxalate (LEXAPRO) 20 MG tablet Take 1 tablet (20 mg total) by mouth daily. 90 tablet 3    FERROUS SULFATE (IRON ORAL) Take by mouth.      fluticasone (FLONASE) 50 mcg/actuation nasal spray SHAKE LIQUID AND USE 1 TO 2 SPRAYS  IN EACH NOSTRIL DAILY AS NEEDED 16 g 0    fluticasone-umeclidin-vilanter (TRELEGY ELLIPTA) 200-62.5-25 mcg DsDv Inhale 1 puff into the lungs daily. 60 each 5    montelukast (SINGULAIR) 10 mg tablet take 1 tablet by mouth at bedtime 90 tablet 3    omeprazole (PRILOSEC) 40 MG capsule Take 1 capsule (40 mg total) by mouth every morning before breakfast. 30 capsule 0    predniSONE (DELTASONE) 20 MG tablet 1 by mouth twice daily x 3 days then 1 by mouth once daily x 4 days (Patient taking differently: 1 by mouth twice daily x 3 days then 1 by mouth once daily x 4 days  As needed) 10 tablet 0     No current facility-administered medications for this visit.          ROS:   Review of Systems  as above   Objective:   Physical Exam      Wt Readings from Last 3 Encounters:   02/01/23 171 lb (77.6 kg)   12/08/22 171 lb (77.6 kg)   04/08/22 165 lb (74.8 kg)       There were no vitals filed for this visit.  There is no height or weight on file to calculate BMI.  There is no height or weight on file to calculate BSA.    NAD  Psych: A&Ox3; reactive affect; no obv thought d/o       Lab Results   Component Value Date    WBC 4.3 12/22/2022    HGB 12.9 12/22/2022    HCT 37.7  12/22/2022    MCV 87.7 12/22/2022    PLT 274 12/22/2022       Lab Results   Component Value Date    HGBA1C 5.8 (H) 12/22/2022    HGBA1C 5.9 (H) 10/01/2020     Lab Results   Component Value Date    CREATININE 0.72 12/22/2022       Lab Results   Component Value Date    GLUCOSE 101 (H) 12/22/2022    BUN 11 12/22/2022    CO2 26 12/22/2022    CREATININE 0.72 12/22/2022    K 4.2 12/22/2022    NA 138 12/22/2022    CL 100 12/22/2022    CALCIUM 9.3 12/22/2022    ALBUMIN 4.0 12/22/2022    PROT 7.2 12/22/2022    ALKPHOS 43 12/22/2022    ALT 11 12/22/2022    AST 30 09/30/2017    BILITOT 1.3 12/22/2022       Lab Results   Component Value Date    CHOLTOT 251 (H) 12/22/2022    TRIG 164 (H) 12/22/2022    HDL 75 12/22/2022    LDL 143 12/22/2022       Lab Results   Component Value Date    TSH 3.32 12/22/2022     Lab Results   Component Value Date    VITAMINB12 >1506 (H) 10/01/2020          Upcoming Health Maintenance       Comprehensive Physical Exam (Yearly)  Never done    Immunization: DTaP/Tdap/Td (1 - Tdap)  Never done    Immunization: Hepatitis B (1 of 3 - 19+ 3-dose series)  Never done    Immunization: Pneumococcal (2 of 2 - PCV)  Overdue since 10/04/2009    Immunization: Zoster (1 of 2)  Never done    Cervical Cancer Screening/Pap Smear (MyChart) (Every 3 Years)  Overdue since 05/13/2020    Depression Monitoring (PHQ-9) (Every  3 Months)  Overdue since 04/21/2021    Alcohol Misuse Screening (Yearly)  Overdue since 10/27/2022    Colorectal Cancer Screening (MyChart) (Cologuard (FIT-DNA) - Every 3 Years)  Overdue since 12/04/2022    Immunization: Influenza (MyChart) (1)  Due since 06/05/2023    Mammogram (MyChart) (Every 2 Years)  Next due on 03/30/2025    Diabetes Screening (Every 3 Years)  Next due on 12/21/2025           Immunization History   Administered Date(s) Administered    COVID-19, mRNA, Moderna monovalent, age 47+ 04/20/2021, 05/18/2021    Influenza, quadrivalent, intradermal, preservative-free 07/04/2017    Influenza,  quadrivalent, preservative-free 08/06/2015, 06/25/2016, 09/11/2018, 09/30/2020    Influenza, unspecified 05/31/2008    Pneumococcal polysaccharide, 23-valent 10/04/2008         Assessment/Plan:     This was a Phone conversation, in lieu of an in-person visit. The patient provided verbal consent to participate in the telehealth visit.   I spent 7 minutes speaking with the patient, conducting an interview, performing a limited exam, and educating the patient on my assessment and plan. I also spent 3 minutes, on the same day as the encounter, preparing to see the patient (eg, review of tests), ordering medications, tests, or procedures, documenting clinical information in the electronic or other health record, and performing non-face-to-face activities.       A/P:  Patient Instructions     Encounter Diagnoses   Name Primary?         Attention deficit disorder, unspecified hyperactivity presence  - stable  - continue with the current treatment   Yes        RTC: 3 months    New meds:   none  Patient education given  none  Patient verbalized understanding & agreement of the proposed plan.

## 2023-06-22 ENCOUNTER — Ambulatory Visit: Payer: PRIVATE HEALTH INSURANCE

## 2023-06-29 ENCOUNTER — Institutional Professional Consult (permissible substitution): Admit: 2023-06-29 | Discharge: 2023-06-29 | Payer: PRIVATE HEALTH INSURANCE

## 2023-06-29 DIAGNOSIS — J3089 Other allergic rhinitis: Secondary | ICD-10-CM

## 2023-06-29 NOTE — Progress Notes (Signed)
Extract #1 Escalating: DM, CT, DG, M   Dose: 0 ml   VT # 1= 13 mm  Site: right arm  Administered by: C. Niti Leisure RN    Extract #2 Escalating : TR, GR, W  Dose: 0 ml   VT # 2= 13 mm  Site: left arm  Administered by: Wayne Both RN    VT' s were larger than normal limits therefore no additional injections were given today . These vials are same strength as previous vials , we will continue to escalate as tolerated until pt reaches Maintenance dose.    No noted reactions from the previous injection. The patient denies any fever or any other symptoms from the last 24 hours.     Epinephrine injector on hand today.

## 2023-07-06 ENCOUNTER — Ambulatory Visit: Payer: PRIVATE HEALTH INSURANCE

## 2023-07-07 ENCOUNTER — Institutional Professional Consult (permissible substitution): Admit: 2023-07-07 | Discharge: 2023-07-07 | Payer: PRIVATE HEALTH INSURANCE

## 2023-07-07 DIAGNOSIS — J3089 Other allergic rhinitis: Secondary | ICD-10-CM

## 2023-07-07 NOTE — Progress Notes (Signed)
Extract #1 Escalating  Dose: 0.30 ml  Site: right arm      Extract #2 Escalating  Dose: 0.30 ml  Site: left arm  Administered by: M. Dawson Hollman LPN    No noted reactions from the previous injection. The patient denies any fever or any other symptoms from the last 24 hours.     Epinephrine injector on hand today.

## 2023-07-13 ENCOUNTER — Institutional Professional Consult (permissible substitution): Admit: 2023-07-13 | Discharge: 2023-07-13 | Payer: PRIVATE HEALTH INSURANCE

## 2023-07-13 DIAGNOSIS — J3089 Other allergic rhinitis: Secondary | ICD-10-CM

## 2023-07-13 NOTE — Progress Notes (Signed)
Extract #1   Dose: 0.35 ml  Escalating  Site: right arm      Extract #2   Dose: 0.35 ml  Escalating  Site: left arm  Administered by: M. Trena Dunavan LPN    No noted reactions from the previous injection. The patient denies any fever or any other symptoms from the last 24 hours.     Epinephrine injector on hand today.

## 2023-07-20 ENCOUNTER — Institutional Professional Consult (permissible substitution): Admit: 2023-07-20 | Discharge: 2023-07-20 | Payer: PRIVATE HEALTH INSURANCE

## 2023-07-20 DIAGNOSIS — J3089 Other allergic rhinitis: Secondary | ICD-10-CM

## 2023-07-20 NOTE — Progress Notes (Signed)
Extract #1 Escalating: DM, CT, DG, M  Dose: 0.40 ml  Site: right arm     Extract #2 Escalating: TR, GR, W  Dose: 0.40 ml  Site: left arm    Administered by: C. Deona Novitski RN    No noted reactions from the previous injection. The patient denies any fever or any other symptoms from the last 24 hours.     Epinephrine injector on hand today.

## 2023-07-27 ENCOUNTER — Ambulatory Visit: Payer: PRIVATE HEALTH INSURANCE

## 2023-08-03 ENCOUNTER — Institutional Professional Consult (permissible substitution): Admit: 2023-08-03 | Discharge: 2023-08-03 | Payer: PRIVATE HEALTH INSURANCE

## 2023-08-03 DIAGNOSIS — J3089 Other allergic rhinitis: Secondary | ICD-10-CM

## 2023-08-03 NOTE — Progress Notes (Addendum)
Extract #1   Dose: 0.40 ml  Escalating Repeat dose, missed 1 week  Site: right arm      Extract #2   Dose: 0.40 ml  Escalating Repeat dose, missed 1 week  Site: left arm  Administered by: M. Tekesha Almgren LPN    No noted reactions from the previous injection. The patient denies any fever or any other symptoms from the last 24 hours.     Epinephrine injector on hand today.

## 2023-08-10 ENCOUNTER — Ambulatory Visit: Payer: PRIVATE HEALTH INSURANCE

## 2023-08-17 ENCOUNTER — Ambulatory Visit: Payer: PRIVATE HEALTH INSURANCE

## 2023-08-18 ENCOUNTER — Ambulatory Visit: Admit: 2023-08-19 | Discharge: 2023-08-19 | Payer: PRIVATE HEALTH INSURANCE

## 2023-08-18 ENCOUNTER — Ambulatory Visit
Admit: 2023-08-18 | Discharge: 2023-08-18 | Payer: PRIVATE HEALTH INSURANCE | Attending: Student in an Organized Health Care Education/Training Program

## 2023-08-18 DIAGNOSIS — F419 Anxiety disorder, unspecified: Secondary | ICD-10-CM

## 2023-08-18 DIAGNOSIS — Z01419 Encounter for gynecological examination (general) (routine) without abnormal findings: Secondary | ICD-10-CM

## 2023-08-18 LAB — HPV HIGH RISK WITH GENOTYPING
HPV DNA High Risk Oth: NEGATIVE
HPV Genotype 16: NEGATIVE
HPV Genotype 18: NEGATIVE

## 2023-08-18 NOTE — Progress Notes (Signed)
 I was present throughout and served as a chaperone for the intimate part of this patient's examination.

## 2023-08-18 NOTE — Progress Notes (Signed)
GYN PREVENTATIVE HEALTH VISIT    Mary [name] is a 52 y.o. [phone] who presents for annual well woman exam.      Concerns: has been having light irregular unpredictable bleeding for past year, no full 12 months without menses. Has significant perimenopausal symptoms for > 5 years now.     Screening  - Cervical CA Screening:   - Hx Abnl: reports hx of ?cervical CA vs CA  - Last pap has been several years.   - Due today  - Breast CA screening:   - Personal/Family breast/uterine/ovarian/colon CA Hx: mother uterine CA at 35yo > no known genetic testing. Info regarding genetic testing offered and pt will let us know if desires.   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 Melvyn Neth   - Mammogram: 2024 wnl, continue yearly due to history  - Colon CA screening:  cologard a few years ago,already ordered by PCP  - DXA: @52yo   - Depression Symptoms: reports on lexapro and THC  - Safety Concerns:denies  - Immunizations: UTD  - Seat belt, Sunscreen, Healthy diet/exercise: encouraged    GYN History  - LMP: Patient's last menstrual period was 08/01/2023 (exact date).. Periods: irregular bleeding that is light but unpredictable. Did have ablation in 2017  - Sexually active: yes. Issues: not personally, but would like to increase libido to match partner. Discussed setting mood/etc and if needed sexual health clinic.   - STI Hx/Sx: denies  - Desires no testing  - Contraception: partner vasectomy    ROS: Negative except per HPI     History  Social History     Substance and Sexual Activity   Sexual Activity Yes    Partners: Female    Comment: vasectomy     Family History   Problem Relation Age of Onset    Uterine Cancer Mother 92        uterine vs cervical     Hypertension Father     Asthma Father     Alcohol abuse Father     Lung Cancer Father     Bone Cancer Paternal Grandmother 33    Asthma Other     Allergies Other     Melanoma Neg Hx     Breast Cancer Neg Hx     Ovarian cancer Neg  Hx     Colon Cancer Neg Hx     Pancreatic Cancer Neg Hx     Prostate Cancer Neg Hx     Anesthesia problems Neg Hx      Past Medical History:   Diagnosis Date    ADD (attention deficit disorder)     Allergy     Anemia     Anxiety     Asthma     Cancer (CMS-HCC)     Cervical dysplasia     Chronic back pain     Depression     Diverticulitis     Environmental allergies     Iron deficiency     Varicella     Vitamin D deficiency      Past Surgical History:   Procedure Laterality Date    ARTHROSCOPY KNEE MENISECTOMY, MENISCAL REPAIR Right 2013    right    AUGMENTATION BREAST ENDOSCOPIC  2001    [place], [place]  implant replacement due to left implant rupture     AUGMENTATION MAMMAPLASTY  age 65-24    [place], [place]    BIOPSY BREAST NEEDLE LOCALIZATION Right 06/02/2016  Procedure: RIGHT BREAST NEEDLE LOCALIZED EXCISIONAL BIOPSY OF 2 AREAS,   ;  Surgeon: Cristal Generous, MD;  Location: [facility] OR;  Service: General;  Laterality: Right;    BREAST EXCISIONAL BIOPSY      atypical cells    CERVICAL CONE BIOPSY      COSMETIC SURGERY      ENDOMETRIAL BIOPSY      benign polyp    FUNCTONAL ENDO SINUS SURGERY WITH LANDMARK Bilateral 02/03/2022    Procedure: endoscopic sinus surgery with septoplasty and inferior turbinate reductions.;  Surgeon: [name], MD;  Location: [facility] OR;  Service: ENT;  Laterality: Bilateral;    HYSTEROSCOPY ABLATION ENDOMETRIAL N/A 06/02/2016    Procedure: HYSTEROSCOPIC RESECTION OF ENDOMETRIAL POLYP;  Surgeon: Raylene Everts;  Location: [facility] OR;  Service: Gynecology;  Laterality: N/A;    uterine biopsy       x2     OB History   Gravida Para Term Preterm AB Living   2 2 2     2    SAB IAB Ectopic Multiple Live Births           2      # Outcome Date GA Lbr Len/2nd Weight Sex Type Anes PTL Lv   2 Term      Vag-Spont   LIV   1 Term      Vag-Spont   LIV     Current Medications as of 08/18/2023  3:14 PM       Outpatient Medications         Quantity Refills Start End    albuterol (PROAIR HFA) 90 mcg/actuation Inhl inhaler 8 g  11 11/17/2022 --    azelastine (ASTELIN) 137 mcg (0.1 %) nasal spray 30 mL 1 07/23/2021 --    cetirizine (ZYRTEC) 10 MG tablet -- --  --    cholecalciferol, vitamin D3, 125 mcg (5,000 unit) Tab -- --  --    dextroamphetamine-amphetamine (ADDERALL) 20 mg Tab 60 tablet 0 06/17/2023 07/17/2023    dextroamphetamine-amphetamine (ADDERALL) 20 mg Tab 60 tablet 0 07/17/2023 08/16/2023    dextroamphetamine-amphetamine (ADDERALL) 20 mg Tab 60 tablet 0 08/16/2023 09/15/2023    escitalopram oxalate (LEXAPRO) 20 MG tablet 90 tablet 3 05/10/2022 --    FERROUS SULFATE (IRON ORAL) -- --  --    fluticasone (FLONASE) 50 mcg/actuation nasal spray 16 g 0 08/23/2016 --    fluticasone-umeclidin-vilanter (TRELEGY ELLIPTA) 200-62.5-25 mcg DsDv 60 each 5 12/08/2022 --    montelukast (SINGULAIR) 10 mg tablet 90 tablet 3 02/14/2023 --    omeprazole (PRILOSEC) 40 MG capsule 30 capsule 0 12/08/2022 --    predniSONE (DELTASONE) 20 MG tablet 10 tablet 0 10/27/2021 --                  Social History     Tobacco Use    Smoking status: Never    Smokeless tobacco: Never    Tobacco comments:     02/24/2011; passive smoke exposure:no (06/18/2005)   Vaping Use    Vaping status: Never Used   Substance Use Topics    Alcohol use: Yes     Alcohol/week: 4.0 standard drinks of alcohol     Types: 4 Glasses of wine per week     Comment: socially    Drug use: No     Comment: 01/07/2011     Patient has no known drug allergies or adverse reactions.    Physical Exam  Body mass index is 25.95 kg/m.  BP 134/86  Pulse 84   Ht 5' 6 (1.676 m)   Wt 160 lb 12.8 oz (72.9 kg)   LMP 08/01/2023 (Exact Date)   SpO2 98%   BMI 25.95 kg/m     Physical Exam  Constitutional:       General: She is not in acute distress.     Appearance: Normal appearance.   HENT:      Head: Normocephalic and atraumatic.   Eyes:      Extraocular Movements: Extraocular movements intact.      Conjunctiva/sclera: Conjunctivae normal.   Pulmonary:      Effort: Pulmonary effort is normal. No respiratory  distress.   Abdominal:      Palpations: Abdomen is soft.      Tenderness: There is no abdominal tenderness.   Musculoskeletal:         General: No deformity. Normal range of motion.   Neurological:      General: No focal deficit present.      Mental Status: She is alert. Mental status is at baseline.   Skin:     General: Skin is warm and dry.   Psychiatric:         Mood and Affect: Mood normal.         Behavior: Behavior normal.         Breast: normal breasts, no suspicious masses or tenderness, and no skin changes or nipple discharge. Known lipoma noted on right chest side wall, approximately 4cm.     Pelvic: Normal appearing external genitalia, vagina, cervix.  No palpable enlarged uterus or adnexal masses  No palpable inguinal lymphadenopathy  No current blood  Mild prolapse noted  Exam conducted with chaperone present.     A/P: 52 y.o. G2P2002 here for annual well-woman exam  - Cervical CA screening: Pap+HPV today  - Breast: Screening UTD, hx of excision of atypical hyperplasia. Encouraged pt to followup with Dr. Melvyn Neth if desires lipoma removal or if changes noted.       GYN PROBLEM VISIT  - AUB: labs/imaging as below. Recommend that we perform endometrial tissue sampling as well to rule out potential malignancy. Pt will first obtain US in case of polyp recurrence. If polyp, the hysteroscopy D&C polypectomy. If no polyp, office EMB planned.     - Perimenopause: symptoms reviewed, pt already in process of obtaining testing with [facility]. Cautioned pt regarding some of the limitations including lack of FDA approval for some of the tests/potential interventions as well as the fact that with her breast atypica hx, she needs clearance from oncology before considering any potential hormone therapy. There are nonhormonal options for certain symptoms including hot flashes, encouraged pt to review guidebook provided to determine desired methods      RTO for EMB/gyn f/u      Orders Placed This Encounter    Procedures    US Pelvis non-OB complete     Standing Status:   Future     Standing Expiration Date:   11/18/2023     Scheduling Instructions:      Please call [phone]) to schedule appointment.     Order Specific Question:   Reason for exam:     Answer:   Abnormal Bleeding     Order Specific Question:   Specific Scanning Technique     Answer:   Allow Performing Department to Protocol    US Transvaginal     Standing Status:   Future     Standing Expiration Date:  11/18/2023     Scheduling Instructions:      Please call [phone]) to schedule appointment.     Order Specific Question:   Reason for exam:     Answer:   Abnormal Bleeding     Order Specific Question:   Specific Scanning Technique     Answer:   Allow Performing Department to Protocol    Thyroid Function Cascade     Standing Status:   Future     Standing Expiration Date:   11/18/2023    Prolactin     Standing Status:   Future     Standing Expiration Date:   11/18/2023    FSH (Follicle stimulating hormone)     Standing Status:   Future     Standing Expiration Date:   11/18/2023    CBC     Standing Status:   Future     Standing Expiration Date:   11/18/2023    HPV High Risk with Genotyping     Standing Status:   Future     Standing Expiration Date:   09/17/2023    Therapy or counseling referral     Referral Priority:   Routine     Referral Type:   Support Services     Requested Specialty:   [facility]     Number of Visits Requested:   1

## 2023-08-23 MED ORDER — escitalopram oxalate (LEXAPRO) 20 MG tablet
20 | ORAL_TABLET | Freq: Every day | ORAL | 3 refills | Status: AC
Start: 2023-08-23 — End: ?

## 2023-08-24 ENCOUNTER — Institutional Professional Consult (permissible substitution): Admit: 2023-08-24 | Discharge: 2023-08-24 | Payer: PRIVATE HEALTH INSURANCE

## 2023-08-24 DIAGNOSIS — J3089 Other allergic rhinitis: Secondary | ICD-10-CM

## 2023-08-24 NOTE — Progress Notes (Signed)
 Extract #1   Dose: 0.35 ml  Escalating  Site: right arm      Extract #2   Dose: 0.35 ml  Escalating  Site: left arm  Administered by: M. Amyre Segundo LPN    Decreased dose, missed 2 weeks.     Patient stated she had a large local reaction from the previous inje

## 2023-08-25 NOTE — Telephone Encounter (Signed)
 Please reach out to the patient. Dr. Zenda Alpers would like to see the patient a couple days after her ultrasound appt, but I do not see anything available until January.       Thanks!

## 2023-08-30 ENCOUNTER — Other Ambulatory Visit: Admit: 2023-08-30 | Payer: PRIVATE HEALTH INSURANCE

## 2023-08-30 ENCOUNTER — Inpatient Hospital Stay
Admit: 2023-08-30 | Payer: PRIVATE HEALTH INSURANCE | Attending: Student in an Organized Health Care Education/Training Program

## 2023-08-30 DIAGNOSIS — Z01419 Encounter for gynecological examination (general) (routine) without abnormal findings: Secondary | ICD-10-CM

## 2023-08-30 LAB — CBC
Hematocrit: 40 % (ref 35.0–45.0)
Hemoglobin: 13.3 g/dL (ref 11.7–15.5)
MCH: 29.9 pg (ref 27.0–33.0)
MCHC: 33.2 g/dL (ref 32.0–36.0)
MCV: 90 fL (ref 80.0–100.0)
MPV: 8.6 fL (ref 7.5–11.5)
Platelets: 290 10*3/uL (ref 140–400)
RBC: 4.44 10*6/uL (ref 3.80–5.10)
RDW: 13.2 % (ref 11.0–15.0)
WBC: 4.6 10*3/uL (ref 3.8–10.8)

## 2023-08-30 LAB — FOLLICLE STIMULATING HORMONE: FSH: 10.5 m[IU]/mL

## 2023-08-30 LAB — THYROID FUNCTION CASCADE: TSH: 1.6 u[IU]/mL (ref 0.45–4.12)

## 2023-08-30 LAB — PROLACTIN: Prolactin: 8.87 ng/mL (ref 2.74–19.64)

## 2023-08-31 ENCOUNTER — Ambulatory Visit: Payer: PRIVATE HEALTH INSURANCE

## 2023-09-07 ENCOUNTER — Institutional Professional Consult (permissible substitution): Admit: 2023-09-07 | Discharge: 2023-09-07 | Payer: PRIVATE HEALTH INSURANCE

## 2023-09-07 DIAGNOSIS — J3089 Other allergic rhinitis: Secondary | ICD-10-CM

## 2023-09-07 MED FILL — FLUTICASONE FUR. 200 MCG-UMECLID 62.5 MCG-VILANT 25 MCG INHALAT.POWDER: 200-62.5-25 200-62.5-25 mcg | RESPIRATORY_TRACT | 30 days supply | Qty: 60 | Fill #1

## 2023-09-07 NOTE — Progress Notes (Signed)
Extract #1   Dose: 0.35 ml  Escalating  Site: right arm      Extract #2   Dose: 0.35 ml  Escalating  Site: left arm  Administered by: M. Tauriel Scronce LPN    Repeat dose, missed 1 week.    No noted reactions from the previous injection. The patient denies any fever or any other symptoms from the last 24 hours.     Epinephrine injector on hand today.

## 2023-09-08 ENCOUNTER — Ambulatory Visit
Admit: 2023-09-08 | Discharge: 2023-09-08 | Payer: PRIVATE HEALTH INSURANCE | Attending: Student in an Organized Health Care Education/Training Program

## 2023-09-08 DIAGNOSIS — N951 Menopausal and female climacteric states: Secondary | ICD-10-CM

## 2023-09-08 NOTE — Progress Notes (Signed)
S: Patient presents for followup of aub and perimenopausal symptoms.     Patient reports no bleeding for > 1 month now. Back to skipping several months and then having light shorter periods. Has only had the frequent bleeding twice a month once. Is s/p ablation.     She does still need to do her labs with outside hormone clinic, aware of my previous recommendations regarding the limitations of FDA approval and her history of breast atypia. Is also working on setting up psych referral.     Most bothersome symptoms: hot flashes (more daytime), mood swings, cognitive dysfunction.     O: Blood pressure 118/74, pulse 82, weight 161 lb 12.8 oz (73.4 kg), last menstrual period 08/01/2023, SpO2 98%.   Gen: NAD    A/P:   Hot flashes: Discussed medication options, pt unsure. Can also see opinion of psych and such to decide if SSRI/SNRI options vs gabapentin might be more helpful.   AUB: discussed option still for EMB. However, if back to normal perimenopausal changes (lighter more spaced out periods), lower concern. If more persistent/recurrent aub, low threshold to obtain EMB. Pt acknowledges, desires to monitor closely.   Cognitive dysfunction: discussed there is no specific treatment that definitively helps as causes are variable and multifactorial. Likely combination of mood, age, sleep deprivation, etc. Encouraged addressing each potential underlying causes in multidisciplinary fashion to help improve. Will start with improved lifestyle, reducing hot flashes, and psych assistance.     No orders of the defined types were placed in this encounter.

## 2023-09-14 ENCOUNTER — Institutional Professional Consult (permissible substitution): Admit: 2023-09-14 | Discharge: 2023-09-14 | Payer: PRIVATE HEALTH INSURANCE

## 2023-09-14 DIAGNOSIS — J3089 Other allergic rhinitis: Secondary | ICD-10-CM

## 2023-09-14 NOTE — Progress Notes (Signed)
 Extract #1   Dose: 0.40 ml  Escalating  Site: right arm      Extract #2   Dose: 0.40 ml  Escalating  Site: left arm  Administered by: M. Amadi Yoshino LPN    No noted reactions from the previous injection. The patient denies any fever or any other symptoms from the last 24 hours.     Epinephrine injector on hand today.

## 2023-09-21 ENCOUNTER — Institutional Professional Consult (permissible substitution): Admit: 2023-09-21 | Payer: PRIVATE HEALTH INSURANCE

## 2023-09-21 DIAGNOSIS — J3089 Other allergic rhinitis: Secondary | ICD-10-CM

## 2023-09-21 NOTE — Progress Notes (Signed)
 Extract #1   Dose: 0.45 ml  Escalating  Site: right arm      Extract #2   Dose: 0.45 ml  Escalating  Site: left arm  Administered by: M. Sherley Mckenney LPN    No noted reactions from the previous injection. The patient denies any fever or any other symptoms from the last 24 hours.     Epinephrine injector on hand today.

## 2023-09-30 ENCOUNTER — Ambulatory Visit: Payer: PRIVATE HEALTH INSURANCE

## 2023-10-04 MED ORDER — dextroamphetamine-amphetamine (ADDERALL) 20 mg Tab
20 | ORAL_TABLET | Freq: Two times a day (BID) | ORAL | 0 refills | Status: AC
Start: 2023-10-04 — End: 2023-11-03

## 2023-10-26 ENCOUNTER — Ambulatory Visit: Admit: 2023-10-27 | Payer: PRIVATE HEALTH INSURANCE

## 2023-10-26 ENCOUNTER — Ambulatory Visit: Admit: 2023-10-26 | Payer: PRIVATE HEALTH INSURANCE

## 2023-10-26 DIAGNOSIS — R41840 Attention and concentration deficit: Secondary | ICD-10-CM

## 2023-10-26 LAB — URINE DRUG SCREEN WITHOUT CONFIRMATION, STAT
Amphetamine, 500 ng/mL Cutoff: POSITIVE — AB
Barbiturates UR, 300  ng/mL Cutoff: NEGATIVE
Benzodiazepines UR, 300 ng/mL Cutoff: NEGATIVE
Buprenorphine, 5 ng/mL Cutoff: NEGATIVE
Cocaine UR, 300 ng/mL Cutoff: NEGATIVE
Fentanyl, 2 ng/mL Cutoff: NEGATIVE
Methadone, UR, 300 ng/mL Cutoff: NEGATIVE
Opiates UR, 300 ng/mL Cutoff: NEGATIVE
Oxycodone, 100 ng/mL Cutoff: NEGATIVE
THC UR, 50 ng/mL Cutoff: POSITIVE — AB
Tricyclic Antidepressants, 300 ng/mL Cutoff: NEGATIVE

## 2023-10-26 MED ORDER — dextroamphetamine-amphetamine (ADDERALL) 20 mg Tab
20 | ORAL_TABLET | Freq: Two times a day (BID) | ORAL | 0 refills | Status: AC
Start: 2023-10-26 — End: 2024-01-24

## 2023-10-26 MED ORDER — dextroamphetamine-amphetamine (ADDERALL) 20 mg Tab
20 | ORAL_TABLET | Freq: Two times a day (BID) | ORAL | 0 refills | Status: AC
Start: 2023-10-26 — End: 2023-12-25

## 2023-10-26 MED ORDER — dextroamphetamine-amphetamine (ADDERALL) 20 mg Tab
20 | ORAL_TABLET | Freq: Two times a day (BID) | ORAL | 0 refills | Status: AC
Start: 2023-10-26 — End: 2023-11-25

## 2023-10-26 NOTE — Progress Notes (Signed)
 Subjective   HPI:   Patient ID: Mary [name] is a 53 y.o. female.    The following HPI was reviewed and copied forward (with edits) from a note written by me on 06/17/23. I have reviewed and updated the history, physical exam, data, assessment, and plan of the note as detailed below so that it reflects my evaluation and management of the patient.       The patient's chart was reviewed prior to the visit as part of pre-visit planning  No chief complaint on file.     ADD,     Past: AXR 40mg  once daily  Current Adderall  SA form 20mg  bid (am and pm), concentration is doing ok (past did better w 45mg  but difficulty getting it from pharmacy)   Denies any s/e   Denies diversion or st/drug abuse   Has rec'ed CTR for MM: uses edibles as needed for anxiety, sleep          10/27/2021     1:42 PM 01/28/2022     4:21 PM 04/29/2022     1:53 PM 06/25/2022     4:05 PM 02/22/2023     2:36 PM 06/17/2023    10:38 AM 10/26/2023     2:42 PM   Controlled Substance Monitoring   OARRS/eKASPER Status Reviewed Reviewed Reviewed Reviewed Reviewed Reviewed Reviewed   OARRS/eKASPER Consistent with Prescriber Expectation CINDERELLA CINDERELLA CINDERELLA CINDERELLA CINDERELLA CINDERELLA CINDERELLA     I have completed the required OARRS/eKASPER documentation for this patient on 10/26/2023.  Sharmarke Cicio MARLA Stank, MD    Past Medical History:   Diagnosis Date    ADD (attention deficit disorder)     Allergy     Anemia     Anxiety     Asthma     Cancer (CMS-HCC)     Cervical dysplasia     Chronic back pain     Depression     Diverticulitis     Environmental allergies     Iron deficiency     Varicella     Vitamin D deficiency        Current Outpatient Medications   Medication Sig Dispense Refill    albuterol  (PROAIR  HFA) 90 mcg/actuation Inhl inhaler Inhale 2 puffs into the lungs every 6 hours as needed for Wheezing. 8 g 11    azelastine  (ASTELIN ) 137 mcg (0.1 %) nasal spray Use 1 spray into each nostril 2 times a day. Use in each nostril as directed 30 mL 1    cetirizine  (ZYRTEC ) 10 MG tablet Take 1 tablet (10 mg total) by  mouth daily as needed for Allergies.      cholecalciferol, vitamin D3, 125 mcg (5,000 unit) Tab Take by mouth.      dextroamphetamine -amphetamine  (ADDERALL ) 20 mg Tab Take 1 tablet (20 mg total) by mouth in the morning and at bedtime for 30 days. 60 tablet 0    [START ON 11/25/2023] dextroamphetamine -amphetamine  (ADDERALL ) 20 mg Tab Take 1 tablet (20 mg total) by mouth in the morning and at bedtime for 30 days. 60 tablet 0    [START ON 12/25/2023] dextroamphetamine -amphetamine  (ADDERALL ) 20 mg Tab Take 1 tablet (20 mg total) by mouth in the morning and at bedtime for 30 days. 60 tablet 0    escitalopram  oxalate (LEXAPRO ) 20 MG tablet TAKE 1 TABLET BY MOUTH DAILY 90 tablet 3    FERROUS SULFATE (IRON ORAL) Take by mouth.      fluticasone  (FLONASE ) 50 mcg/actuation nasal spray SHAKE LIQUID AND  USE 1 TO 2 SPRAYS IN EACH NOSTRIL DAILY AS NEEDED 16 g 0    fluticasone -umeclidin-vilanter (TRELEGY ELLIPTA ) 200-62.5-25 mcg DsDv Inhale 1 puff into the lungs daily. 60 each 5    montelukast  (SINGULAIR ) 10 mg tablet take 1 tablet by mouth at bedtime 90 tablet 3    omeprazole  (PRILOSEC ) 40 MG capsule Take 1 capsule (40 mg total) by mouth every morning before breakfast. 30 capsule 0    predniSONE  (DELTASONE ) 20 MG tablet 1 by mouth twice daily x 3 days then 1 by mouth once daily x 4 days (Patient taking differently: 1 by mouth twice daily x 3 days then 1 by mouth once daily x 4 days  As needed) 10 tablet 0     No current facility-administered medications for this visit.          ROS:   Review of Systems  as above   Objective:   Physical Exam      Wt Readings from Last 3 Encounters:   10/26/23 163 lb (73.9 kg)   09/08/23 161 lb 12.8 oz (73.4 kg)   08/18/23 160 lb 12.8 oz (72.9 kg)       Vitals:    10/26/23 1436   BP: 136/84   Pulse: 68   SpO2: 99%   Weight: 163 lb (73.9 kg)   Height: 5' 6 (1.676 m)     Body mass index is 26.31 kg/m.  Body surface area is 1.86 meters squared.    NAD  Psych: A&Ox3; reactive affect; no obv thought d/o        Lab Results   Component Value Date    WBC 4.6 08/30/2023    HGB 13.3 08/30/2023    HCT 40.0 08/30/2023    MCV 90.0 08/30/2023    PLT 290 08/30/2023       Lab Results   Component Value Date    HGBA1C 5.8 (H) 12/22/2022    HGBA1C 5.9 (H) 10/01/2020     Lab Results   Component Value Date    CREATININE 0.72 12/22/2022       Lab Results   Component Value Date    GLUCOSE 101 (H) 12/22/2022    BUN 11 12/22/2022    CO2 26 12/22/2022    CREATININE 0.72 12/22/2022    K 4.2 12/22/2022    NA 138 12/22/2022    CL 100 12/22/2022    CALCIUM 9.3 12/22/2022    ALBUMIN 4.0 12/22/2022    PROT 7.2 12/22/2022    ALKPHOS 43 12/22/2022    ALT 11 12/22/2022    AST 30 09/30/2017    BILITOT 1.3 12/22/2022       Lab Results   Component Value Date    CHOLTOT 251 (H) 12/22/2022    TRIG 164 (H) 12/22/2022    HDL 75 12/22/2022    LDL 143 12/22/2022       Lab Results   Component Value Date    TSH 1.60 08/30/2023     Lab Results   Component Value Date    VITAMINB12 >1506 (H) 10/01/2020          Upcoming Health Maintenance       Comprehensive Physical Exam (Yearly)  Never done    Immunization: DTaP/Tdap/Td (1 - Tdap)  Never done    Immunization: Hepatitis B (1 of 3 - 19+ 3-dose series)  Never done    Immunization: Pneumococcal (2 of 2 - PCV)  Overdue since 10/04/2009    Immunization: Zoster (1  of 2)  Never done    Alcohol Misuse Screening (Yearly)  Overdue since 10/27/2022    Colorectal Cancer Screening (MyChart) (Cologuard (FIT-DNA) - Every 3 Years)  Overdue since 12/04/2022    Immunization: Influenza (MyChart) (1)  Overdue since 06/05/2023    Depression Monitoring (PHQ-9) (Every 3 Months)  Next due on 01/24/2024    Mammogram (MyChart) (Every 2 Years)  Next due on 03/30/2025    Diabetes Screening (Every 3 Years)  Next due on 12/21/2025    Cervical Cancer Screening/Pap Smear (MyChart) (Every 5 Years)  Next due on 08/17/2028           Immunization History   Administered Date(s) Administered    COVID-19, mRNA, Moderna monovalent, age 3+ 04/20/2021,  05/18/2021    Influenza, quadrivalent, intradermal, preservative-free 07/04/2017    Influenza, quadrivalent, preservative-free 08/06/2015, 06/25/2016, 09/11/2018, 09/30/2020    Influenza, unspecified 05/31/2008    Pneumococcal polysaccharide, 23-valent 10/04/2008         Assessment/Plan:     240-255    A/P:  Patient Instructions     Encounter Diagnoses   Name Primary?    Attention or concentration deficit  - doing well  - annual contract/urine drug screen: done  - Prescription(s) electronically prescribed today    Yes    Screen for colon cancer  - [facility]: [phone]  [facility] :[phone]        Nov 14, 2023: Resident pap smear clinic       RTC: 3 months    New meds:   none  Patient education given  none  Patient verbalized understanding & agreement of the proposed plan.

## 2023-10-26 NOTE — Patient Instructions (Addendum)
 Encounter Diagnoses   Name Primary?    Attention or concentration deficit  - doing well  - annual contract/urine drug screen: done  - Prescription(s) electronically prescribed today    Yes    Screen for colon cancer  - [facility]: [phone]  [facility] :[phone]        Nov 14, 2023: Resident pap smear clinic

## 2023-11-01 MED FILL — FLUTICASONE FUR. 200 MCG-UMECLID 62.5 MCG-VILANT 25 MCG INHALAT.POWDER: 200-62.5-25 200-62.5-25 mcg | RESPIRATORY_TRACT | 30 days supply | Qty: 60 | Fill #2

## 2023-11-04 ENCOUNTER — Ambulatory Visit: Admit: 2023-11-04 | Discharge: 2023-11-04 | Payer: PRIVATE HEALTH INSURANCE

## 2023-11-04 DIAGNOSIS — J3089 Other allergic rhinitis: Secondary | ICD-10-CM

## 2023-11-04 NOTE — Progress Notes (Signed)
 Extract #1   Dose: 0.35 ml  escalating  Site: right arm      Extract #2   Dose: 0.35 ml  escalating  Site: left arm  Administered by: M. Dametri Ozburn LPN      No noted reactions from the previous injection. The patient denies any fever or any other symptoms from the last 24 hours.     Epinephrine injector on hand today.

## 2023-11-07 ENCOUNTER — Ambulatory Visit: Payer: PRIVATE HEALTH INSURANCE | Attending: Student in an Organized Health Care Education/Training Program

## 2023-11-16 ENCOUNTER — Ambulatory Visit: Admit: 2023-11-16 | Discharge: 2023-11-16 | Payer: PRIVATE HEALTH INSURANCE

## 2023-11-16 DIAGNOSIS — J3089 Other allergic rhinitis: Secondary | ICD-10-CM

## 2023-11-16 NOTE — Progress Notes (Signed)
 Extract #1   Dose: 0.35 ml  escalating  Site: right arm      Extract #2   Dose: 0.35 ml  escalating  Site: left arm  Administered by: M. Jahiem Franzoni LPN    Repeat dose time lapse 1 week.     No noted reactions from the previous injection. The patient denies any fever or any other symptoms from the last 24 hours.     Epinephrine  injector on hand today.

## 2023-11-22 NOTE — Progress Notes (Signed)
 [facility]  ALLERGY TREATMENT VIAL WORKSHEET ENVIRONMENTAL  NAME: [name], Mary DOB: [birth year] DR. SEDAGHAT DATE: 11/22/23  EXTRACT#1 START DATE: 11/2020  DUST MITES: End Point: Vial Dilution: Volume: ml   D. Farinae 4 Concentrate 0.2   D. Pteronyssinus 4 Concentrate 0.2   ANIMALS:      Cockroach 3 Not treated ---   Cat 5 Concentrate 0.2   Dog 3 Concentrate 0.2   MOLDS      *Alternaria 4 Concentrate 0.1   *Aspergillus 6 Concentrate 0.1   Helminthosporium 3 Not treated ---   Hormodendrum 3 Not treated ---   Candida Albicans 3 Not treated ---   Penicillium notatum 3 Not treated ---   Fusarium Oxysporium 5 Concentrate 0.2   Epicoccum 5 Concentrate 0.2   Pullularia 4 Concentrate 0.2   Rhizopus 3 Not treated ---   Total Volume of Antigens:  1.6 ml   Total Volume of Diluent :  3.4 ml   Total Volume:  5.0 ml   EXTRACT#2 START DATE: 11/2020  TREES      Cedar 5 Concentrate 0.2   Birch 6 Concentrate 0.2   Ash 5 Not treated ---   Pecan/ Hickory 5 Concentrate 0.2   Elm 6 Not treated ---   Box Elder 3 Not treated ---   Oak 5 Concentrate 0.2   Cottonwood 4 Not treated ---   GRASSES      Johnson Grass 5 Not treated ---   Timothy Grass 5 Concentrate 0.2   Bermuda Grass 4 Concentrate 0.2   WEEDS      English Plantain 4 Concentrate 0.2   Ragweed 6 Concentrate 0.2   Lambs quarter 3 Not treated ---   Pigweed 3 Not treated ---   Sorrel 3 Not treated ---   Wormwood 5 Concentrate 0.2   Total Volume of Antigens: 1.8 ml   Total Volume of Diluent : 3.2 ml     Total Volume:  5.0 ml   Prepared by: C. Elianne Gubser RN, BSN   11/22/23   Charges generated on paper invoice.  Extract lot numbers [id number]    D.Jeannett: [id number]  EXP: 11/28/2024  D.Pteronyssinus: [id number] EXP: 04/11/2025  Cat Hair : [id number] EXP: 04/10/2025  Dog Hair: [id number] EXP: 07/04/2026  Alternaria: [id number] EXP: 03/20/2026  Aspergillus: [id number] EXP: 02/20/2026  Fusarium: [id number] EXP: 01/10/2025  Epiccocum: [id number] EXP: 11/01/2025  Pullaria: [id number] EXP: 11/01/2025    Cedar:  [id number]  EXP: 01/22/2026  Mary [name], Mary [name]: [id number] EXP: 08/27/2026  Pecan/Hickory: [id number] EXP: 07/04/2026  Mary [name]: [id number] EXP: 08/27/2026  English Plantain: [id number] EXP: 05/23/2026  Ragweed Short/Giant: [id number] EXP: 08/14/2025  Wormwood: [id number] EXP: 12/27/25  Mary [name]: [id number] EXP: 04/10/2025  Bermuda Grass: (10) [id number] EXP: 03/19/2026

## 2023-11-23 ENCOUNTER — Ambulatory Visit: Admit: 2023-11-23 | Discharge: 2023-11-23 | Payer: PRIVATE HEALTH INSURANCE

## 2023-11-23 DIAGNOSIS — J3089 Other allergic rhinitis: Secondary | ICD-10-CM

## 2023-11-23 NOTE — Progress Notes (Signed)
Extract #1   Dose: 0.40 ml  escalating  Site: right arm      Extract #2   Dose: 0.40 ml  escalating  Site: left arm  Administered by: M. Parke Jandreau LPN      No noted reactions from the previous injection. The patient denies any fever or any other symptoms from the last 24 hours.     Epinephrine injector on hand today.

## 2023-11-30 ENCOUNTER — Ambulatory Visit: Payer: PRIVATE HEALTH INSURANCE

## 2023-12-06 MED FILL — FLUTICASONE FUR. 200 MCG-UMECLID 62.5 MCG-VILANT 25 MCG INHALAT.POWDER: 200-62.5-25 200-62.5-25 mcg | RESPIRATORY_TRACT | 30 days supply | Qty: 60 | Fill #3

## 2023-12-07 ENCOUNTER — Ambulatory Visit: Admit: 2023-12-07 | Discharge: 2023-12-07 | Payer: PRIVATE HEALTH INSURANCE

## 2023-12-13 ENCOUNTER — Ambulatory Visit: Payer: PRIVATE HEALTH INSURANCE

## 2023-12-13 ENCOUNTER — Ambulatory Visit: Payer: PRIVATE HEALTH INSURANCE | Attending: Student in an Organized Health Care Education/Training Program

## 2023-12-16 ENCOUNTER — Ambulatory Visit: Payer: PRIVATE HEALTH INSURANCE

## 2023-12-21 ENCOUNTER — Ambulatory Visit: Admit: 2023-12-21 | Discharge: 2023-12-21 | Payer: PRIVATE HEALTH INSURANCE

## 2023-12-21 DIAGNOSIS — J3089 Other allergic rhinitis: Secondary | ICD-10-CM

## 2023-12-21 NOTE — Progress Notes (Signed)
 Extract #1 escalating  Dose: 0 ml VT#1= 11 mm    Site: right arm      Extract #2 escalating  Dose: 0 ml VT#2= 13 mm    Site: left arm  Administered by: M. Gerlene Glassburn LPN    No additional dose given vial test only.     No noted reactions from the previous injection. The patient denies any fever or any other symptoms from the last 24 hours.     Epinephrine injector on hand today.

## 2023-12-28 ENCOUNTER — Ambulatory Visit: Admit: 2023-12-28 | Discharge: 2023-12-28 | Payer: PRIVATE HEALTH INSURANCE

## 2023-12-28 DIAGNOSIS — J3089 Other allergic rhinitis: Secondary | ICD-10-CM

## 2023-12-28 NOTE — Progress Notes (Signed)
 Extract #1 escalating  Dose: 0.30 ml  Site: right arm      Extract #2 escalating  Dose: 0.30 ml  Site: left arm    Administered by: C. Perina Salvaggio RN    No noted reactions from the previous injection. The patient denies any fever or any other symptoms from the last 24 hours.     Epinephrine injector on hand today.

## 2024-01-04 ENCOUNTER — Ambulatory Visit: Admit: 2024-01-04 | Discharge: 2024-01-04 | Payer: PRIVATE HEALTH INSURANCE

## 2024-01-04 DIAGNOSIS — J3089 Other allergic rhinitis: Secondary | ICD-10-CM

## 2024-01-04 NOTE — Progress Notes (Signed)
 Extract #1 escalating  Dose: 0.35 ml    Site: right arm      Extract #2 escalating  Dose: 0.35 ml    Site: left arm  Administered by: M. Minami Arriaga LPN      No noted reactions from the previous injection. The patient denies any fever or any other symptoms from the last 24 hours.     Epinephrine injector on hand today.

## 2024-01-11 ENCOUNTER — Ambulatory Visit: Admit: 2024-01-11 | Discharge: 2024-01-11 | Payer: PRIVATE HEALTH INSURANCE

## 2024-01-11 DIAGNOSIS — J3089 Other allergic rhinitis: Secondary | ICD-10-CM

## 2024-01-11 NOTE — Progress Notes (Signed)
 Extract #1 escalating  Dose: 0.40 ml    Site: right arm      Extract #2 escalating  Dose: 0.40 ml    Site: left arm  Administered by: M. Sela Falk LPN      No noted reactions from the previous injection. The patient denies any fever or any other symptoms from the last 24 hours.     Epinephrine injector on hand today.

## 2024-01-18 ENCOUNTER — Ambulatory Visit: Admit: 2024-01-18 | Discharge: 2024-01-18 | Payer: PRIVATE HEALTH INSURANCE

## 2024-01-18 DIAGNOSIS — J3089 Other allergic rhinitis: Secondary | ICD-10-CM

## 2024-01-18 NOTE — Progress Notes (Signed)
 Extract #1 escalating  Dose: 0.45 ml    Site: right arm      Extract #2 escalating  Dose: 0.45 ml    Site: left arm  Administered by: M. Beretta Ginsberg LPN      No noted reactions from the previous injection. The patient denies any fever or any other symptoms from the last 24 hours.     Epinephrine injector on hand today.

## 2024-01-24 ENCOUNTER — Ambulatory Visit: Payer: PRIVATE HEALTH INSURANCE

## 2024-01-26 ENCOUNTER — Ambulatory Visit: Admit: 2024-01-26 | Discharge: 2024-01-26 | Payer: PRIVATE HEALTH INSURANCE

## 2024-01-26 DIAGNOSIS — J3089 Other allergic rhinitis: Secondary | ICD-10-CM

## 2024-01-26 NOTE — Progress Notes (Signed)
 Extract #1 escalating  Dose: 0.50 ml  Site: right arm      Extract #2 escalating  Dose: 0.50 ml  Site: left arm    Administered by: C. Jean Alejos RN    Pt has reached maintenance dose today , she is interested in home injections , instructed pt that she has not been in to see Dr Everitt Hoa  for 2 yrs and policy requires that she see him annually for allergy check up . She will schedule follow up with him soon.     No noted reactions from the previous injection. The patient denies any fever or any other symptoms from the last 24 hours.     Epinephrine  injector on hand today.

## 2024-02-01 ENCOUNTER — Ambulatory Visit: Admit: 2024-02-01 | Discharge: 2024-02-01 | Payer: PRIVATE HEALTH INSURANCE

## 2024-02-01 DIAGNOSIS — J3089 Other allergic rhinitis: Secondary | ICD-10-CM

## 2024-02-01 NOTE — Progress Notes (Signed)
 Extract #1 escalating  Dose: 0.50 ml    Site: left arm      Extract #2 escalating  Dose: 0.50 ml    Site: left arm  Administered by: M. Adiel Mcnamara LPN      No noted reactions from the previous injection. The patient denies any fever or any other symptoms from the last 24 hours.     Epinephrine  injector on hand today.

## 2024-02-08 ENCOUNTER — Ambulatory Visit: Payer: PRIVATE HEALTH INSURANCE

## 2024-02-10 ENCOUNTER — Ambulatory Visit: Admit: 2024-02-10 | Payer: PRIVATE HEALTH INSURANCE

## 2024-02-10 DIAGNOSIS — R41840 Attention and concentration deficit: Secondary | ICD-10-CM

## 2024-02-10 MED ORDER — dextroamphetamine-amphetamine (ADDERALL) 20 mg Tab
20 | ORAL_TABLET | Freq: Two times a day (BID) | ORAL | 0 refills | 30.00000 days | Status: AC
Start: 2024-02-10 — End: 2024-03-11

## 2024-02-10 MED ORDER — dextroamphetamine-amphetamine (ADDERALL) 20 mg Tab
20 | ORAL_TABLET | Freq: Two times a day (BID) | ORAL | 0 refills | 30.00000 days | Status: AC
Start: 2024-02-10 — End: 2024-04-10

## 2024-02-10 MED ORDER — dextroamphetamine-amphetamine (ADDERALL) 20 mg Tab
20 | ORAL_TABLET | Freq: Two times a day (BID) | ORAL | 0 refills | 30.00000 days | Status: AC
Start: 2024-02-10 — End: 2024-05-10

## 2024-02-10 NOTE — Patient Instructions (Addendum)
 Encounter Diagnosis   Name Primary?    Attention or concentration deficit Yes     - side effects could be from the adderall?  - trial with tapering off    colonoscopy   - [facility]: [phone]

## 2024-02-10 NOTE — Progress Notes (Signed)
 Subjective   HPI:   Patient ID: Mary [name] is a 53 y.o. female.    The following HPI was reviewed and copied forward (with edits) from a note written by me on 10/26/23. I have reviewed and updated the history, physical exam, data, assessment, and plan of the note as detailed below so that it reflects my evaluation and management of the patient.       The patient's chart was reviewed prior to the visit as part of pre-visit planning  Chief Complaint   Patient presents with    Medication Refill      ADD,     Past: AXR 40mg  once daily  Current Adderall SA form 20mg  bid (am and pm), wants to gradually taper off: worries that may be having side effects with fatigue which has worsened since perimenopausal   Denies any other s/e   Has rec'ed CTR for MM: uses edibles as needed for anxiety, sleep  Ongoing mgt for perimenopause: seeing G[facility]: will b compounding Prog          10/27/2021     1:42 PM 01/28/2022     4:21 PM 04/29/2022     1:53 PM 06/25/2022     4:05 PM 02/22/2023     2:36 PM 06/17/2023    10:38 AM 10/26/2023     2:42 PM   Controlled Substance Monitoring   OARRS/eKASPER Status Reviewed Reviewed Reviewed Reviewed Reviewed Reviewed Reviewed   OARRS/eKASPER Consistent with Prescriber Expectation Mary [name]     I have completed the required OARRS/eKASPER documentation for this patient on 02/10/2024.  Mary Nath Holli Lunger, MD    Past Medical History:   Diagnosis Date    ADD (attention deficit disorder)     Allergy     Anemia     Anxiety     Asthma     Cancer (CMS-HCC)     Cervical dysplasia     Chronic back pain     Depression     Diverticulitis     Environmental allergies     Iron deficiency     Varicella     Vitamin D deficiency        Current Outpatient Medications   Medication Sig Dispense Refill    albuterol  (PROAIR  HFA) 90 mcg/actuation Inhl inhaler Inhale 2 puffs into the lungs every 6 hours as needed for Wheezing. 8 g 11    cetirizine  (ZYRTEC ) 10 MG tablet Take 1 tablet (10 mg total) by mouth daily  as needed for Allergies.      cholecalciferol, vitamin D3, 125 mcg (5,000 unit) Tab Take by mouth.      [START ON 04/10/2024] dextroamphetamine -amphetamine  (ADDERALL) 20 mg Tab Take 1 tablet (20 mg total) by mouth in the morning and at bedtime for 30 days. 60 tablet 0    dextroamphetamine -amphetamine  (ADDERALL) 20 mg Tab Take 1 tablet (20 mg total) by mouth in the morning and at bedtime for 30 days. 60 tablet 0    [START ON 03/11/2024] dextroamphetamine -amphetamine  (ADDERALL) 20 mg Tab Take 1 tablet (20 mg total) by mouth in the morning and at bedtime for 30 days. 60 tablet 0    escitalopram  oxalate (LEXAPRO ) 20 MG tablet TAKE 1 TABLET BY MOUTH DAILY 90 tablet 3    FERROUS SULFATE (IRON ORAL) Take by mouth.      fluticasone  (FLONASE ) 50 mcg/actuation nasal spray SHAKE LIQUID AND USE 1 TO 2 SPRAYS IN EACH NOSTRIL DAILY  AS NEEDED 16 g 0    fluticasone -umeclidin-vilanter (TRELEGY ELLIPTA ) 200-62.5-25 mcg DsDv Inhale 1 puff into the lungs daily. 60 each 5    montelukast  (SINGULAIR ) 10 mg tablet take 1 tablet by mouth at bedtime 90 tablet 3    omeprazole  (PRILOSEC) 40 MG capsule Take 1 capsule (40 mg total) by mouth every morning before breakfast. 30 capsule 0    azelastine  (ASTELIN ) 137 mcg (0.1 %) nasal spray Use 1 spray into each nostril 2 times a day. Use in each nostril as directed (Patient not taking: Reported on 02/10/2024) 30 mL 1     No current facility-administered medications for this visit.          ROS:   Review of Systems  as above   Objective:   Physical Exam      Wt Readings from Last 3 Encounters:   02/10/24 166 lb 9.6 oz (75.6 kg)   10/26/23 163 lb (73.9 kg)   09/08/23 161 lb 12.8 oz (73.4 kg)       Vitals:    02/10/24 1446   BP: 153/86   Pulse: 74   SpO2: 96%   Weight: 166 lb 9.6 oz (75.6 kg)   Height: 5' 6 (1.676 m)       Body mass index is 26.89 kg/m.  Body surface area is 1.88 meters squared.    NAD  Psych: A&Ox3; reactive affect; no obv thought d/o       Lab Results   Component Value Date    WBC 4.6  08/30/2023    HGB 13.3 08/30/2023    HCT 40.0 08/30/2023    MCV 90.0 08/30/2023    PLT 290 08/30/2023       Lab Results   Component Value Date    HGBA1C 5.8 (H) 12/22/2022    HGBA1C 5.9 (H) 10/01/2020     Lab Results   Component Value Date    CREATININE 0.72 12/22/2022       Lab Results   Component Value Date    GLUCOSE 101 (H) 12/22/2022    BUN 11 12/22/2022    CO2 26 12/22/2022    CREATININE 0.72 12/22/2022    K 4.2 12/22/2022    NA 138 12/22/2022    CL 100 12/22/2022    CALCIUM 9.3 12/22/2022    ALBUMIN 4.0 12/22/2022    PROT 7.2 12/22/2022    ALKPHOS 43 12/22/2022    ALT 11 12/22/2022    AST 30 09/30/2017    BILITOT 1.3 12/22/2022       Lab Results   Component Value Date    CHOLTOT 251 (H) 12/22/2022    TRIG 164 (H) 12/22/2022    HDL 75 12/22/2022    LDL 143 12/22/2022       Lab Results   Component Value Date    TSH 1.60 08/30/2023     Lab Results   Component Value Date    VITAMINB12 >1506 (H) 10/01/2020          Upcoming Health Maintenance       ASCVD Assessment (Yearly)  Never done    Comprehensive Physical Exam (Yearly)  Never done    Immunization: DTaP/Tdap/Td (1 - Tdap)  Never done    Immunization: Hepatitis B (1 of 3 - 19+ 3-dose series)  Never done    Immunization: Pneumococcal (2 of 2 - PCV)  Overdue since 10/04/2009    Immunization: Zoster (1 of 2)  Never done    Alcohol Misuse Screening (  Yearly)  Overdue since 10/27/2022    Colorectal Cancer Screening (MyChart) (Cologuard (FIT-DNA) - Every 3 Years)  Overdue since 12/04/2022    Immunization: Influenza Product/process development scientist) (Season Ended)  Next due on 06/04/2024    Depression Monitoring (PHQ-9) (Yearly)  Next due on 10/25/2024    Mammogram (MyChart) (Every 2 Years)  Next due on 03/30/2025    Diabetes Screening (Every 3 Years)  Next due on 12/21/2025    Cervical Cancer Screening/Pap Smear (MyChart) (Every 5 Years)  Next due on 08/17/2028           Immunization History   Administered Date(s) Administered    COVID-19, mRNA, Moderna monovalent, age 4+ 04/20/2021, 05/18/2021     Influenza, quadrivalent, intradermal, preservative-free 07/04/2017    Influenza, quadrivalent, preservative-free 08/06/2015, 06/25/2016, 09/11/2018, 09/30/2020    Influenza, unspecified 05/31/2008    Pneumococcal polysaccharide, 23-valent 10/04/2008         Assessment/Plan:     255-310  15 mins      A/P:  Patient Instructions     Encounter Diagnosis   Name Primary?    Attention or concentration deficit Yes     - side effects could be from the adderall?  - trial with tapering off    colonoscopy   - [facility]: [phone]       RTC: 3 months    New meds:   none  Patient education given  none  Patient verbalized understanding & agreement of the proposed plan.

## 2024-02-13 MED ORDER — fluticasone-umeclidin-vilanter (TRELEGY ELLIPTA) 200-62.5-25 mcg DsDv
200-62.5-25 | Freq: Every day | RESPIRATORY_TRACT | 5 refills | 30.00000 days | Status: AC
Start: 2024-02-13 — End: ?
  Filled 2024-02-13: qty 60, 30d supply, fill #0

## 2024-02-15 ENCOUNTER — Ambulatory Visit: Admit: 2024-02-15 | Discharge: 2024-02-15 | Payer: PRIVATE HEALTH INSURANCE

## 2024-02-15 DIAGNOSIS — J3089 Other allergic rhinitis: Secondary | ICD-10-CM

## 2024-02-15 NOTE — Progress Notes (Signed)
 Extract #1 escalating  Dose: 0.45 ml    Site: right arm      Extract #2 escalating  Dose: 0.45 ml    Site: left arm  Administered by: M. Beretta Ginsberg LPN      No noted reactions from the previous injection. The patient denies any fever or any other symptoms from the last 24 hours.     Epinephrine injector on hand today.

## 2024-02-22 ENCOUNTER — Ambulatory Visit: Admit: 2024-02-22 | Discharge: 2024-02-22 | Payer: PRIVATE HEALTH INSURANCE

## 2024-02-22 DIAGNOSIS — J3089 Other allergic rhinitis: Secondary | ICD-10-CM

## 2024-02-22 NOTE — Progress Notes (Signed)
 Extract #1 escalating  Dose: 0.50 ml  Site: right arm      Extract #2 escalating  Dose: 0.50 ml  Site: left arm    Administered by: C. Akira Adelsberger RN    Pt has reached her Maintenance dose today , allergy symptoms are stable. She stated that she has her annual visit scheduled with Dr. Everitt Hoa in July . She is interested in doing home injections but has not been seen by the doctor since 04/2022.    No noted reactions from the previous injection. The patient denies any fever or any other symptoms from the last 24 hours.     Epinephrine  injector on hand today.

## 2024-02-29 ENCOUNTER — Ambulatory Visit: Payer: PRIVATE HEALTH INSURANCE

## 2024-03-05 MED FILL — FLUTICASONE FUR. 200 MCG-UMECLID 62.5 MCG-VILANT 25 MCG INHALAT.POWDER: 200-62.5-25 200-62.5-25 mcg | RESPIRATORY_TRACT | 30 days supply | Qty: 60 | Fill #1

## 2024-03-07 ENCOUNTER — Ambulatory Visit: Admit: 2024-03-07 | Discharge: 2024-03-07 | Payer: PRIVATE HEALTH INSURANCE

## 2024-03-07 DIAGNOSIS — J3089 Other allergic rhinitis: Secondary | ICD-10-CM

## 2024-03-07 MED ORDER — diphenhydrAMINE HCL (BENADRYL EXTRA STRENGTH) 2 % topical gel
2 | Freq: Once | TOPICAL | Status: AC
Start: 2024-03-07 — End: 2024-03-07
  Administered 2024-03-07: 18:00:00 via TOPICAL

## 2024-03-07 NOTE — Progress Notes (Signed)
 Extract #1 escalating  Dose: 0.50 ml    Site: right arm      Extract #2 escalating  Dose: 0.50 ml    Site: left arm  Administered by: M. Nicholas Ossa LPN      No noted reactions from the previous injection. The patient denies any fever or any other symptoms from the last 24 hours.     Epinephrine injector on hand today.

## 2024-03-07 NOTE — Progress Notes (Signed)
 [facility]  ALLERGY TREATMENT VIAL WORKSHEET ENVIRONMENTAL  NAME: Mary [name], Mary [name]    DOB: 1971/06/28    DR. SEDAGHAT     DATE: 03/07/24  EXTRACT#1 START DATE: 11/2020                           Maint: 02/2024  DUST MITES: End Point: Vial Dilution: Volume: ml   D. Farinae 4 Concentrate 0.2   D. Pteronyssinus 4 Concentrate 0.2   ANIMALS:      Cockroach 3 Not treated ---   Cat 5 Concentrate 0.2   Dog 3 Concentrate 0.2   MOLDS      *Alternaria 4 Concentrate 0.1   *Aspergillus 6 Concentrate 0.1   Helminthosporium 3 Not treated ---   Hormodendrum 3 Not treated ---   Candida Albicans 3 Not treated ---   Penicillium notatum 3 Not treated ---   Fusarium Oxysporium 5 Concentrate 0.2   Epicoccum 5 Concentrate 0.2   Pullularia 4 Concentrate 0.2   Rhizopus 3 Not treated ---   Total Volume of Antigens:  1.6 ml   Total Volume of Diluent :  3.4 ml   Total Volume:  5.0 ml   EXTRACT#2 START DATE: 11/2020                           Maint: 02/2024  TREES      Cedar 5 Concentrate 0.2   Birch 6 Concentrate 0.2   Ash 5 Not treated ---   Pecan/ Hickory 5 Concentrate 0.2   Elm 6 Not treated ---   Box Elder 3 Not treated ---   Oak 5 Concentrate 0.2   Cottonwood 4 Not treated ---   GRASSES      Johnson Grass 5 Not treated ---   Emeterio Hansen Grass 5 Concentrate 0.2   Bermuda Grass 4 Concentrate 0.2   WEEDS      English Plantain 4 Concentrate 0.2   Ragweed 6 Concentrate 0.2   Lambs quarter 3 Not treated ---   Pigweed 3 Not treated ---   Sorrel 3 Not treated ---   Wormwood 5 Concentrate 0.2   Total Volume of Antigens: 1.8 ml   Total Volume of Diluent : 3.2 ml     Total Volume:  5.0 ml   Prepared by: C. Isai Gottlieb RN, BSN   03/07/24   Charges generated on paper invoice.  Extract lot numbers [id number]    D.Lorain Robson: [id number]  EXP: 07/03/2025  D.Pteronyssinus: [id number]  EXP: 12/10/2026  Cat Hair : [id number] EXP: 07/31/2025  Dog Hair: [id number] EXP: 03/20/2026  Alternaria: [id number] EXP: 03/20/2026  Aspergillus: [id number] EXP: 02/20/2026  Fusarium:  [id number] EXP: 04/19/2026  Epiccocum: [id number] EXP: 05/01/2026  Pullaria: [id number] EXP: 11/01/2025    Cedar: [id number]  EXP: 01/22/2026  Amelia Jurist, River: [id number] EXP: 10/29/2026  Pecan/Hickory: [id number] EXP: 10/29/2026  Devin Foerster Red: [id number] EXP: 11/26/2026  English Plantain: [id number] EXP: 05/23/2026  Ragweed Short/Giant: [id number] EXP: 10/13/2025  Wormwood: [id number] EXP: 07/04/2026  Dione Franks: [id number] EXP: 06/26/2025  [place] Grass: (10) [id number] EXP: 04/23/2026

## 2024-03-14 ENCOUNTER — Ambulatory Visit: Payer: PRIVATE HEALTH INSURANCE

## 2024-03-21 ENCOUNTER — Ambulatory Visit: Admit: 2024-03-21 | Discharge: 2024-03-21 | Payer: PRIVATE HEALTH INSURANCE

## 2024-03-21 DIAGNOSIS — J3089 Other allergic rhinitis: Secondary | ICD-10-CM

## 2024-03-21 NOTE — Progress Notes (Signed)
 Extract #1: Maintenance: DM, CT, DG, M  Dose: 0.5 ml  Site: right arm  Administered by: G.Nicholle Falzon, BSN, RN-BC    Extract #2: Maintenance: TR, GR, W  Dose: 0.5  Site: left arm  Administered by: G.Margee Trentham, BSN, RN-BC    No noted reactions from the previous injection. The patient denies any fever or any other symptoms from the last 24 hours.

## 2024-03-28 ENCOUNTER — Ambulatory Visit: Payer: PRIVATE HEALTH INSURANCE

## 2024-04-04 ENCOUNTER — Ambulatory Visit: Admit: 2024-04-04 | Discharge: 2024-04-04 | Payer: PRIVATE HEALTH INSURANCE

## 2024-04-04 DIAGNOSIS — J3089 Other allergic rhinitis: Principal | ICD-10-CM

## 2024-04-04 MED ORDER — diphenhydrAMINE HCL (BENADRYL EXTRA STRENGTH) 2 % topical gel
2 | Freq: Once | TOPICAL | Status: AC
Start: 2024-04-04 — End: 2024-04-04
  Administered 2024-04-04: 18:00:00 via TOPICAL

## 2024-04-04 NOTE — Progress Notes (Signed)
 Extract #1 Maintenance  Dose: 0 ml  VT # 1= 13 mm  Site: right arm      Extract #2 Maintenance  Dose: 0 ml   VT # 2=13 mm  Site: left arm    Administered by: C. Harlen Danford RN    Vial tests are large , no additional injections given today.     No noted reactions from the previous injection. The patient denies any fever or any other symptoms from the last 24 hours.     Epinephrine  injector on hand today.

## 2024-04-11 ENCOUNTER — Ambulatory Visit: Admit: 2024-04-11 | Discharge: 2024-04-11 | Payer: PRIVATE HEALTH INSURANCE

## 2024-04-11 DIAGNOSIS — J3089 Other allergic rhinitis: Principal | ICD-10-CM

## 2024-04-11 MED ORDER — diphenhydrAMINE HCL (BENADRYL EXTRA STRENGTH) 2 % topical gel
2 | Freq: Once | TOPICAL | Status: AC
Start: 2024-04-11 — End: 2024-04-11
  Administered 2024-04-11: 18:00:00 via TOPICAL

## 2024-04-11 NOTE — Progress Notes (Signed)
 Extract #1 maintenance  Dose: 0.30 ml    Site: right arm      Extract #2 maintenance  Dose: 0.30 ml    Site: left arm  Administered by: M. Asjia Berrios LPN      No noted reactions from the previous injection. The patient denies any fever or any other symptoms from the last 24 hours.     Epinephrine injector on hand today.

## 2024-04-13 MED ORDER — ALBUTEROL 90 mcg/actuation Inhl inhaler
90 | Freq: Four times a day (QID) | RESPIRATORY_TRACT | 11 refills | 25.00000 days | Status: AC | PRN
Start: 2024-04-13 — End: ?

## 2024-04-19 ENCOUNTER — Ambulatory Visit: Admit: 2024-04-19 | Discharge: 2024-04-19 | Payer: PRIVATE HEALTH INSURANCE

## 2024-04-19 ENCOUNTER — Ambulatory Visit: Admit: 2024-04-19 | Payer: PRIVATE HEALTH INSURANCE

## 2024-04-19 DIAGNOSIS — J329 Chronic sinusitis, unspecified: Principal | ICD-10-CM

## 2024-04-19 DIAGNOSIS — J3089 Other allergic rhinitis: Principal | ICD-10-CM

## 2024-04-19 DIAGNOSIS — J328 Other chronic sinusitis: Principal | ICD-10-CM

## 2024-04-19 MED ORDER — diphenhydrAMINE HCL (BENADRYL EXTRA STRENGTH) 2 % topical gel
2 | Freq: Once | TOPICAL | Status: AC
Start: 2024-04-19 — End: 2024-04-19
  Administered 2024-04-19: 19:00:00 via TOPICAL

## 2024-04-19 MED ORDER — triamcinolone (NASACORT) 55 mcg nasal inhaler
55 | Freq: Two times a day (BID) | NASAL | 11 refills | 30.00000 days | Status: AC
Start: 2024-04-19 — End: ?

## 2024-04-19 MED ORDER — azelastine (ASTELIN) 137 mcg (0.1 %) nasal spray
137 | Freq: Two times a day (BID) | NASAL | 11 refills | 30.00000 days | Status: AC
Start: 2024-04-19 — End: ?

## 2024-04-19 NOTE — Progress Notes (Signed)
 [facility]  Department of Otolaryngology--Head and Neck Surgery  Division of Rhinology, Allergy, and Anterior Skull Base Surgery  Dominique FABIENE Section, MD, PhD, [facility]  [address] [place], [place] [address]  Telephone: [phone]        Pt name: Mary [name]  MRN: [id number]      History Present Illness on 04/08/2022  This is a 53 y.o. 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  She underwent bilateral maxillary antrostomies, septoplasty and inferior turbinate reductions with me on Feb 03, 2022.  Postoperatively, I have had her use twice daily budesonide  rinses and she has also resumed subcutaneous immunotherapy.    Overall she's doing well.        Interval history on April 19, 2024:  She continues to do well from a sinonasal standpoint.  She is using Nasacort  nasal spray but has not been using Azelastine  for some time.  She continues on subcutaneous immunotherapy.  She has been at maintenance since May 2025.  She does report some persistent left-sided intermittent ear pain.      Radiology Findings:     Reviewed a sinus CT scan from December 29, 2021 which shows:  1.  No significant paranasal sinus disease.   2.  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   3.  Multilevel cervical facet arthrosis with degenerative subluxations discussed above.   4.  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 If potentially of clinical relevance, MRI IAC protocol without and with contrast would be helpful for further characterization. Stability since 2021 would make an aggressive or malignant process unlikely.     Physical Exam:  General Appearance: well appearing, in no apparent distress  Anterior Rhinoscopy: Septum is normal.  Inferior turbinates are hypertrophied.  Other: None.     Procedure: Because of the patients history and symptomatology, I  performed bilateral nasal endoscopy after obtaining verbal consent from the patient.  On inspection of the interior of the nasal cavity, I examined the inferior turbinates, middle turbinates, middle meatuses, superior meatuses and sphenoethmoid recesses.  Visualization of the superior meatuses and sphenoethmoid recesses was obscured by the middle turbinate and the septum.  All structures appeared normal/unremarkable except as noted below:    Widely patent maxillary antrostomies bilaterally    Widely patent nasal passages      Impression:  Chronic rhinosinusitis  Environmental allergies  S/p bilateral maxillary antrostomies, septoplasty and inferior turbinate reductions on Feb 03, 2022      Plan:  Continue Nasacort  and azelastine   Continue subcutaneous immunotherapy.  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    Recommended MRI and follow-up with otology given prior CT scan findings and persistent ear discomfort.  She will think about it and let me know.    Follow-up with me in 1 year, at which point we could consider cutting down on the frequency of the injections to every 2 weeks.        Number and Complexity of Problems (Level 4)  During this encounter, I addressed 2 or more stable chronic illnesses      Risk of Complication and/or Morbidity or Mortality of Patient Management (Level  4)  The patient's management entails Moderate risk of complications and/or morbidity or mortality.  Attestation: in the case of resident participation in the care of this patient, I attest that I have seen and examined the patient, formulated the plan and agree with the documentation.      Dominique FABIENE Section, MD, PhD, FACS  Director, Division of Rhinology, Allergy, and Anterior Skull Base Surgery  Department of Otolaryngology--Head and Neck Surgery  [facility]  [facility]    CC:   Ila MARLA Stank, MD  Stank Ila, MD    **This note was dictated with voice-recognition software**

## 2024-04-19 NOTE — Progress Notes (Signed)
 Extract #1 maintenance  Dose: 0.35 ml    Site: right arm      Extract #2 maintenance  Dose: 0.35 ml    Site: left arm  Administered by: M. Jocelyn Nold LPN      No noted reactions from the previous injection. The patient denies any fever or any other symptoms from the last 24 hours.     Epinephrine injector on hand today.

## 2024-04-25 ENCOUNTER — Ambulatory Visit: Admit: 2024-04-25 | Discharge: 2024-04-25 | Payer: PRIVATE HEALTH INSURANCE

## 2024-04-25 DIAGNOSIS — J3089 Other allergic rhinitis: Principal | ICD-10-CM

## 2024-04-25 NOTE — Progress Notes (Signed)
 Extract #1 Maintenance  Dose: 0.40 ml  Site: right arm      Extract #2 Maintenance  Dose: 0.40 ml  Site: left arm    Administered by: C. Aubriegh Minch RN    Pt is interested in doing home injections, she saw Dr Colton for her annual allergy check up last week, she needs to pick up her Epipen  from the Pharmacy and she will look at ordering allergy syringes from [employer] . We will start training her to self inject next week.     No noted reactions from the previous injection. The patient denies any fever or any other symptoms from the last 24 hours.     Epinephrine  injector on hand today.

## 2024-05-02 ENCOUNTER — Ambulatory Visit: Admit: 2024-05-02 | Discharge: 2024-05-02 | Payer: PRIVATE HEALTH INSURANCE

## 2024-05-02 DIAGNOSIS — J3089 Other allergic rhinitis: Principal | ICD-10-CM

## 2024-05-02 MED ORDER — EPINEPHrine (AUVI-Q) 0.3 mg/0.3 mL AtIn injection
0.3 | Freq: Once | INTRAMUSCULAR | 0 refills | 30.00000 days | Status: AC
Start: 2024-05-02 — End: 2024-05-02

## 2024-05-02 NOTE — Progress Notes (Signed)
 Extract #1 Maintenance  Dose: 0.45 ml  Site: right arm      Extract #2 Maintenance  Dose: 0.45 ml  Site: left arm    Administered by: C. Khaylee Mcevoy RN    Pt has received her allergy syringes but is still waiting on her Epipen  from the pharmacy before she can take her allergy shots at home.     No noted reactions from the previous injection. The patient denies any fever or any other symptoms from the last 24 hours.     Epinephrine  injector on hand today.

## 2024-05-07 MED ORDER — EPINEPHrine (EPI-PEN) 0.3 mg/0.3 mL AtIn injection
0.3 | Freq: Once | INTRAMUSCULAR | 0 refills | 30.00000 days | Status: AC
Start: 2024-05-07 — End: 2024-05-07

## 2024-05-07 NOTE — Progress Notes (Unsigned)
 Received call from pt's pharmacy stating they are unable to fill Rx for epi-pen as the only epi-pen they can order is the Auvi-Q .     LVM for pt to return call to see if she is okay with picking up epi-pen from [facility] pharmacy or another pharmacy that carries the brand Teva or Mylan.

## 2024-05-07 NOTE — Progress Notes (Unsigned)
 Received fax from [facility] denying coverage for Auvi-Q  injector. Pt's insurance company will only cover generic epi-pen unless pt has an intolerance to the generic brand.

## 2024-05-09 ENCOUNTER — Ambulatory Visit: Payer: PRIVATE HEALTH INSURANCE

## 2024-05-15 ENCOUNTER — Ambulatory Visit: Admit: 2024-05-15 | Discharge: 2024-05-15 | Payer: PRIVATE HEALTH INSURANCE

## 2024-05-15 DIAGNOSIS — R41840 Attention and concentration deficit: Principal | ICD-10-CM

## 2024-05-15 MED ORDER — dextroamphetamine-amphetamine (ADDERALL) 20 mg Tab
20 | ORAL_TABLET | Freq: Two times a day (BID) | ORAL | 0 refills | 30.00000 days | Status: AC
Start: 2024-05-15 — End: 2024-06-14

## 2024-05-15 MED ORDER — dextroamphetamine-amphetamine (ADDERALL) 20 mg Tab
20 | ORAL_TABLET | Freq: Two times a day (BID) | ORAL | 0 refills | 30.00000 days | Status: AC
Start: 2024-05-15 — End: 2024-07-14

## 2024-05-15 MED ORDER — dextroamphetamine-amphetamine (ADDERALL) 20 mg Tab
20 | ORAL_TABLET | Freq: Two times a day (BID) | ORAL | 0 refills | 30.00000 days | Status: AC
Start: 2024-05-15 — End: 2024-08-13

## 2024-05-15 NOTE — Patient Instructions (Signed)
 Encounter Diagnoses   Name Primary?    Attention or concentration deficit Yes    Attention deficit hyperactivity disorder (ADHD), combined type    - overall stable  - for now plan to continue with the current treatment    - later possible again trial with tapering off if menopausal symptoms under control

## 2024-05-15 NOTE — Progress Notes (Signed)
 Subjective   HPI:   Patient ID: Mary [name] is a 53 y.o. female.    The following HPI was reviewed and copied forward (with edits) from a note written by me on 02/10/24. I have reviewed and updated the history, physical exam, data, assessment, and plan of the note as detailed below so that it reflects my evaluation and management of the patient.       The patient's chart was reviewed prior to the visit as part of pre-visit planning  No chief complaint on file.     ADD     Past: AXR 40mg  once daily  Current Adderall SA form 20mg  bid (am and pm), wants to gradually taper off: had tried cutting tabs in 1/2 recently but didn't help w concentration and fatigue develops   Denies any other s/e   Has rec'ed CTR for MM: uses edibles as needed for anxiety, sleep   Denies diversion or abuse  Ongoing mgt for perimenopause: seeing Gyn [facility]: will b compounding Prog adrenal boost: as dosing is adjusted is hoping will help w ADD mgt          01/28/2022     4:21 PM 04/29/2022     1:53 PM 06/25/2022     4:05 PM 02/22/2023     2:36 PM 06/17/2023    10:38 AM 10/26/2023     2:42 PM 05/15/2024    10:01 AM   Controlled Substance Monitoring   OARRS/eKASPER Status Reviewed Reviewed Reviewed Reviewed Reviewed Reviewed Reviewed   OARRS/eKASPER Consistent with Prescriber Expectation Y Y Y Y Y Y Y   UDS Consistent with Prescriber Expectation       Y     I have completed the required OARRS/eKASPER documentation for this patient on 05/15/2024.  Nelsie Domino MARLA Stank, MD    Past Medical History:   Diagnosis Date    ADD (attention deficit disorder)     Allergy     Anemia     Anxiety     Asthma     Cancer (CMS-HCC)     Cervical dysplasia     Chronic back pain     Depression     Diverticulitis     Environmental allergies     Iron deficiency     Varicella     Vitamin D deficiency        Current Outpatient Medications   Medication Sig Dispense Refill    dextroamphetamine -amphetamine  (ADDERALL) 20 mg Tab Take 1 tablet (20 mg total) by mouth in  the morning and at bedtime for 30 days. 60 tablet 0    [START ON 06/14/2024] dextroamphetamine -amphetamine  (ADDERALL) 20 mg Tab Take 1 tablet (20 mg total) by mouth in the morning and at bedtime for 30 days. 60 tablet 0    [START ON 07/14/2024] dextroamphetamine -amphetamine  (ADDERALL) 20 mg Tab Take 1 tablet (20 mg total) by mouth in the morning and at bedtime for 30 days. 60 tablet 0    ALBUTEROL  90 mcg/actuation Inhl inhaler Inhale 2 puffs into the lungs every 6 hours as needed for Wheezing. 6.7 g 11    azelastine  (ASTELIN ) 137 mcg (0.1 %) nasal spray Use 1 spray into each nostril 2 times a day. Use in each nostril as directed 30 mL 11    cetirizine  (ZYRTEC ) 10 MG tablet Take 1 tablet (10 mg total) by mouth daily as needed for Allergies.      cholecalciferol, vitamin D3, 125 mcg (5,000 unit) Tab Take by mouth.  escitalopram  oxalate (LEXAPRO ) 20 MG tablet TAKE 1 TABLET BY MOUTH DAILY 90 tablet 3    FERROUS SULFATE (IRON ORAL) Take by mouth.      fluticasone  (FLONASE ) 50 mcg/actuation nasal spray SHAKE LIQUID AND USE 1 TO 2 SPRAYS IN EACH NOSTRIL DAILY AS NEEDED 16 g 0    fluticasone -umeclidin-vilanter (TRELEGY ELLIPTA ) 200-62.5-25 mcg DsDv Inhale 1 puff into the lungs daily. 60 each 5    montelukast  (SINGULAIR ) 10 mg tablet take 1 tablet by mouth at bedtime 90 tablet 3    omeprazole  (PRILOSEC) 40 MG capsule Take 1 capsule (40 mg total) by mouth every morning before breakfast. 30 capsule 0    triamcinolone  (NASACORT ) 55 mcg nasal inhaler Use 2 sprays into each nostril 2 times a day. Indications: please dispense 2 bottles 34 mL 11     No current facility-administered medications for this visit.          ROS:   Review of Systems  as above   Objective:   Physical Exam      Wt Readings from Last 3 Encounters:   04/19/24 165 lb 3.2 oz (74.9 kg)   02/10/24 166 lb 9.6 oz (75.6 kg)   10/26/23 163 lb (73.9 kg)       There were no vitals filed for this visit.      There is no height or weight on file to calculate  BMI.  There is no height or weight on file to calculate BSA.    NAD  Psych: A&Ox3; reactive affect; no obv thought d/o       Lab Results   Component Value Date    WBC 4.6 08/30/2023    HGB 13.3 08/30/2023    HCT 40.0 08/30/2023    MCV 90.0 08/30/2023    PLT 290 08/30/2023       Lab Results   Component Value Date    HGBA1C 5.8 (H) 12/22/2022    HGBA1C 5.9 (H) 10/01/2020     Lab Results   Component Value Date    CREATININE 0.72 12/22/2022       Lab Results   Component Value Date    GLUCOSE 101 (H) 12/22/2022    BUN 11 12/22/2022    CO2 26 12/22/2022    CREATININE 0.72 12/22/2022    K 4.2 12/22/2022    NA 138 12/22/2022    CL 100 12/22/2022    CALCIUM 9.3 12/22/2022    ALBUMIN 4.0 12/22/2022    PROT 7.2 12/22/2022    ALKPHOS 43 12/22/2022    ALT 11 12/22/2022    AST 30 09/30/2017    BILITOT 1.3 12/22/2022       Lab Results   Component Value Date    CHOLTOT 251 (H) 12/22/2022    TRIG 164 (H) 12/22/2022    HDL 75 12/22/2022    LDL 143 12/22/2022       Lab Results   Component Value Date    TSH 2.010 02/10/2024     Lab Results   Component Value Date    VITAMINB12 >1506 (H) 10/01/2020          Upcoming Health Maintenance       Immunization: Pneumococcal (2 of 2 - PCV)  Overdue since 10/04/2009    Alcohol Misuse Screening (Yearly)  Overdue since 10/27/2022    Colorectal Cancer Screening (MyChart) (Cologuard (FIT-DNA) - Every 3 Years)  Overdue since 12/04/2022    ASCVD Assessment (Yearly)  Never done    Comprehensive Physical  Exam (Yearly)  Never done    Immunization: DTaP/Tdap/Td (1 - Tdap)  Never done    Immunization: Hepatitis B (1 of 3 - 19+ 3-dose series)  Never done    Immunization: Zoster (1 of 2)  Never done    Immunization: Influenza (MyChart) (1)  Next due on 06/04/2024    Depression Monitoring (PHQ-9) (Yearly)  Next due on 10/25/2024    Mammogram (MyChart) (Every 2 Years)  Next due on 03/30/2025    Diabetes Screening (Every 3 Years)  Next due on 12/21/2025    Cervical Cancer Screening/Pap Smear (MyChart) (Every 5 Years)   Next due on 08/17/2028           Immunization History   Administered Date(s) Administered    COVID-19, mRNA, Moderna monovalent, age 83+ 04/20/2021, 05/18/2021    Influenza, quadrivalent, intradermal, preservative-free 07/04/2017    Influenza, quadrivalent, preservative-free 08/06/2015, 06/25/2016, 09/11/2018, 09/30/2020    Influenza, unspecified 05/31/2008    Pneumococcal polysaccharide, 23-valent 10/04/2008         Assessment/Plan:           This was a Video visit, including two-way audio and video communication, in lieu of an in-person visit. The patient provided verbal consent to participate in the telehealth visit. The patient location for this telehealth visit is in home. I spent 7 minutes speaking with the patient, conducting an interview, performing a limited exam, and educating the patient on my assessment and plan. I also spent 5 minutes, on the same day as the encounter, preparing to see the patient (eg, review of tests), ordering medications, tests, or procedures, documenting clinical information in the electronic or other health record, and performing non-face-to-face activities.   A/P:  Patient Instructions     Encounter Diagnoses   Name Primary?    Attention or concentration deficit Yes    Attention deficit hyperactivity disorder (ADHD), combined type    - overall stable  - for now plan to continue with the current treatment    - later possible again trial with tapering off if menopausal symptoms under control         RTC: 3 months    New meds:   none  Patient education given  none  Patient verbalized understanding & agreement of the proposed plan.

## 2024-05-16 ENCOUNTER — Ambulatory Visit: Admit: 2024-05-16 | Discharge: 2024-05-16 | Payer: PRIVATE HEALTH INSURANCE

## 2024-05-16 DIAGNOSIS — J3089 Other allergic rhinitis: Principal | ICD-10-CM

## 2024-05-16 MED ORDER — EPINEPHrine (EPI-PEN) 0.3 mg/0.3 mL AtIn injection
0.3 | Freq: Once | INTRAMUSCULAR | 0 refills | 30.00000 days | Status: AC
Start: 2024-05-16 — End: 2024-05-17
  Filled 2024-05-23: qty 2, 1d supply, fill #0

## 2024-05-16 NOTE — Progress Notes (Signed)
 Extract #1 maintenance  Dose: 0.45 ml    Site: right hip      Extract #2 maintenance  Dose: 0.45 ml    Site: left hip  Administered by: M. Aron Needles LPN    Pt self administered using proper technique.     No noted reactions from the previous injection. The patient denies any fever or any other symptoms from the last 24 hours.     Epinephrine  injector on hand today.

## 2024-05-23 ENCOUNTER — Ambulatory Visit: Admit: 2024-05-23 | Discharge: 2024-05-23 | Payer: PRIVATE HEALTH INSURANCE

## 2024-05-23 DIAGNOSIS — J3089 Other allergic rhinitis: Principal | ICD-10-CM

## 2024-05-23 MED FILL — FLUTICASONE FUR. 200 MCG-UMECLID 62.5 MCG-VILANT 25 MCG INHALAT.POWDER: 200-62.5-25 200-62.5-25 mcg | RESPIRATORY_TRACT | 30 days supply | Qty: 60 | Fill #2

## 2024-05-23 NOTE — Progress Notes (Signed)
 Extract #1 Maintenance  Dose: 0.50 ml  Site: right hip       Extract #2 Maintenance  Dose: 0.50 ml  Site: left arm    Administered by: C. Cecilee Rosner RN    Pt still does not have her Epipen  today , she is going to the Pharmacy now to try and pick it up. She self administered today and I suggested she give it a little lower on the hip/ buttocks area because she said her previous allergy injection that she administered last week caused a large local reaction . She signed consent for home injections today and this was sent to scanning. We discussed eliminating her cross reacting foods to ragweed today because she has been eating large amounts of melons and has noticed the start of asthma symptoms slowly starting .     No noted reactions from the previous injection. The patient denies any fever or any other symptoms from the last 24 hours.     Epinephrine  injector on hand today.

## 2024-05-30 ENCOUNTER — Ambulatory Visit: Admit: 2024-05-30 | Discharge: 2024-05-30 | Payer: PRIVATE HEALTH INSURANCE

## 2024-05-30 DIAGNOSIS — J3089 Other allergic rhinitis: Principal | ICD-10-CM

## 2024-05-30 MED ORDER — diphenhydrAMINE HCL (BENADRYL EXTRA STRENGTH) 2 % topical gel
2 | Freq: Once | TOPICAL | Status: AC
Start: 2024-05-30 — End: 2024-05-30
  Administered 2024-05-30: 18:00:00 via TOPICAL

## 2024-05-30 NOTE — Progress Notes (Signed)
 Extract #1 maintenance  Dose: 0.50 ml    Site: right hip      Extract #2 maintenance  Dose: 0.50 ml    Site: left hip  Administered by: M. Keisuke Hollabaugh LPN    Patient self administered using proper technique.    No noted reactions from the previous injection. The patient denies any fever or any other symptoms from the last 24 hours.     Patient is here to pick up allergy vials. Patient was asked to check the name and date of birth to make sure they were correct. Patient denies any reaction from previous vials. Proper instructions were reviewed and discussed with the patient.  Patient is in possession of Epi Pen and demonstrates good understanding of how and when to use it.Reminded the patient to continue with all of the avoidence's and nasal rinsing. Asked about their Epi Pen date of expiration.

## 2024-07-03 MED ORDER — montelukastSINGULAIR10mgtablet
10 | ORAL_TABLET | Freq: Every evening | ORAL | 3 refills | 30.00000 days | Status: AC
Start: 2024-07-03 — End: ?

## 2024-07-03 NOTE — Progress Notes (Signed)
 [facility]  ALLERGY TREATMENT VIAL WORKSHEET ENVIRONMENTAL  NAME: Mary [name], Mary [name]    DOB: [birth year]    DR. SEDAGHAT     DATE: 07/03/24  EXTRACT#1 START DATE: 11/2020                           Maint: 02/2024  DUST MITES: End Point: Vial Dilution: Volume: ml   D. Farinae 4 Concentrate 0.2   D. Pteronyssinus 4 Concentrate 0.2   ANIMALS:      Cockroach 3 Not treated ---   Cat 5 Concentrate 0.2   Dog 3 Concentrate 0.2   MOLDS      *Alternaria 4 Concentrate 0.1   *Aspergillus 6 Concentrate 0.1   Helminthosporium 3 Not treated ---   Hormodendrum 3 Not treated ---   Candida Albicans 3 Not treated ---   Penicillium notatum 3 Not treated ---   Fusarium Oxysporium 5 Concentrate 0.2   Epicoccum 5 Concentrate 0.2   Pullularia 4 Concentrate 0.2   Rhizopus 3 Not treated ---   Total Volume of Antigens:  1.6 ml   Total Volume of Diluent :  3.4 ml   Total Volume:  5.0 ml   EXTRACT#2 START DATE: 11/2020                           Maint: 02/2024  TREES      Cedar 5 Concentrate 0.2   Birch 6 Concentrate 0.2   Ash 5 Not treated ---   Pecan/ Hickory 5 Concentrate 0.2   Elm 6 Not treated ---   Box Elder 3 Not treated ---   Oak 5 Concentrate 0.2   Cottonwood 4 Not treated ---   GRASSES      Johnson Grass 5 Not treated ---   Evalene Grass 5 Concentrate 0.2   Bermuda Grass 4 Concentrate 0.2   WEEDS      English Plantain 4 Concentrate 0.2   Ragweed 6 Concentrate 0.2   Lambs quarter 3 Not treated ---   Pigweed 3 Not treated ---   Sorrel 3 Not treated ---   Wormwood 5 Concentrate 0.2   Total Volume of Antigens: 1.8 ml   Total Volume of Diluent : 3.2 ml     Total Volume:  5.0 ml   Prepared by: C. Scientist, research (physical sciences), Psychiatrist generated on [employer].  Extract lot numbers [id number]    D.Jeannett: [id number] EXP: 05/07/2026  D.Pteronyssinus: [id number]  EXP: 03/04/2027  Cat Hair : [id number] EXP: 04/09/2026  Dog Hair: [id number] EXP: 11/26/2026  Alternaria: [id number] EXP: 03/20/2026  Aspergillus: [id number] EXP: 08/27/2026  Fusarium: [id number]  EXP: 04/19/2026  Epiccocum: [id number] EXP: 05/01/2026  Pullularia: [id number] EXP: 11/01/2025    Cedar: [id number]  EXP: 01/22/2026  Valrie, River: [id number] EXP: 03/26/2027  Pecan/Hickory: [id number] EXP: 01/28/2027  Izell Armor Red: [id number] EXP: 01/28/2027  English Plantain: [id number] EXP: 10/01/2026  Ragweed Short/Giant: [id number] EXP: 12/25/2025  Wormwood: [id number] EXP: 07/04/2026  Evalene Landau: [id number] EXP: 08/22/2025  [place] Grass: (10) [id number] EXP: 10/08/2026

## 2024-07-03 NOTE — Telephone Encounter (Signed)
 Returning pt call to schedule appointment for pick up of her allergy vial for next week but no answer, left VM message requesting a return call at [phone].

## 2024-07-03 NOTE — Telephone Encounter (Signed)
 Pt called and left a voice mail message requesting a refill of her SCIT (home injections). Forwarded the message to Lacinda Officer, RN.

## 2024-07-05 NOTE — Telephone Encounter (Signed)
 Second call placed this morning but no answer, left another VM message requesting a return call with date and time that she needed for vial pick up .

## 2024-07-10 ENCOUNTER — Ambulatory Visit: Admit: 2024-07-10 | Discharge: 2024-07-10 | Payer: PRIVATE HEALTH INSURANCE

## 2024-07-10 DIAGNOSIS — J3089 Other allergic rhinitis: Principal | ICD-10-CM

## 2024-07-10 MED ORDER — DIPHENHYDRAMINE 2 % TOPICAL GEL
2 | Freq: Once | TOPICAL | Status: AC
Start: 2024-07-10 — End: 2024-07-10
  Administered 2024-07-10: 20:00:00 via TOPICAL

## 2024-07-10 NOTE — Progress Notes (Addendum)
 Patient is here to pick up allergy vials.  Patient was asked to check the name and date of birth to make sure they were correct.  Patient denies any reaction from previous vials.  Proper instructions were reviewed and discussed with the patient.      Extract #1 maintenance  Dose: 0 ml VT#1= 9 mm   Site: right arm      Extract #2 maintenance  Dose: 0 ml VT#2= 11 mm    Site: left arm  Administered by: M. Doree Kuehne LPN    Vial test only no additional dose given.  No noted reactions from the previous injection. The patient denies any fever or any other symptoms from the last 24 hours.     Epinephrine  injector on hand today.

## 2024-08-01 NOTE — Telephone Encounter (Signed)
 08/01/24 left a voice message   Sent a my cart

## 2024-08-02 NOTE — Telephone Encounter (Signed)
 This pt called the scheduling line to schedule her urgent referral. Also, the dx is not something we have in the [facility]. Could someone from the team call her back to schedule?   Thanks,  [facility]

## 2024-08-02 NOTE — Telephone Encounter (Signed)
 Patient is scheduled for a Colonoscopy @ [facility] w/ MAC w/ Dr. Donnamae on 10/08/24. Pt instructed to arrive at 12:30 PM procedure is scheduled at 2:00 PM. Prep instructions for GoLYTELY SPLIT DOSE mailed to verified address.    NO Anticoagulants  NO GLP-1 meds    Rx e-scribed to [facility]

## 2024-08-02 NOTE — Telephone Encounter (Signed)
 After chart review, patient is classified as ELIGIBLE to be scheduled at either [facility] before scheduling the procedure), [facility], or [facility].    1) Age: 53 y.o.  2) BMI: (BMI > 50 patient needs OV)  3) Patient has been seen by a [facility] provider (MD, CNP, PA) in the last 3 years?  No  4) Medications:   A) Antiplatelet or Anticoagulants?    No,    B) GLP-1?     No,  > please orient patient to hold the medication for 7 days prior the procedure.   C) Oral anti-diabetic?     No,  >     please orient patient to not take the medication prior to the procedure. They need to talk with their PCP/endocrinologist about diabetes medication management with bowel prep/NPO.    5) Allergies:  Allergies[1]  6) Drug Abuse   No,   7) Gastroenterology  A) Any symptoms or diagnosis?  No,   8) Cardiovascular  A) Any symptoms or diagnosis?   No,   B) Last office visit's date:   40) Pulmonology  A) Any symptoms or diagnosis?  Yes, Asthma  B) Last office visit's date: 05/2024  10) Endocrine  A) Any symptoms or diagnosis?  No,   B) Last office visit's date:  59) GYN/GU/Renal  A) Any symptoms or diagnosis?  No,   B) Last office visit's date:  38) Hematology/Oncology  A) Any symptoms or diagnosis?  No,   B) Last office visit's date:  97) Neuromuscular  A) Any symptoms or diagnosis?  No,   B) Last office visit's date:  2) Anesthesia Events   No  15) Surgical History  Past Surgical History:   Procedure Laterality Date    ARTHROSCOPY KNEE MENISECTOMY, MENISCAL REPAIR Right 2013    right    AUGMENTATION BREAST ENDOSCOPIC  2001    [place], [place]  implant replacement due to left implant rupture     AUGMENTATION MAMMAPLASTY  age 66-24    [place], [place]    BIOPSY BREAST NEEDLE LOCALIZATION Right 06/02/2016    Procedure: RIGHT BREAST NEEDLE LOCALIZED EXCISIONAL BIOPSY OF 2 AREAS,   ;  Surgeon: Ami JONETTA Kerns, MD;  Location: [facility] OR;  Service: General;  Laterality: Right;    BREAST EXCISIONAL BIOPSY      atypical cells    CERVICAL CONE BIOPSY       COSMETIC SURGERY      ENDOMETRIAL BIOPSY      benign polyp    FUNCTONAL ENDO SINUS SURGERY WITH LANDMARK Bilateral 02/03/2022    Procedure: endoscopic sinus surgery with septoplasty and inferior turbinate reductions.;  Surgeon: Dominique Section, MD;  Location: [facility] OR;  Service: ENT;  Laterality: Bilateral;    HYSTEROSCOPY ABLATION ENDOMETRIAL N/A 06/02/2016    Procedure: HYSTEROSCOPIC RESECTION OF ENDOMETRIAL POLYP;  Surgeon: Fairy Reams;  Location: [facility] OR;  Service: Gynecology;  Laterality: N/A;    uterine biopsy       x2           [1] No Known Drug Allergies or Adverse Reactions

## 2024-08-02 NOTE — Telephone Encounter (Signed)
 08/02/24 Left a voice message to please call to schedule a NPV with Caitlyn Cleotilde

## 2024-08-08 MED ORDER — PEG 3350-ELECTROLYTES 236 GRAM-22.74 GRAM-6.74 GRAM-5.86 GRAM SOLUTION
236-22.74-6.74 | ORAL | 0 refills | Status: AC
Start: 2024-08-08 — End: ?

## 2024-09-07 ENCOUNTER — Ambulatory Visit: Admit: 2024-09-07 | Payer: PRIVATE HEALTH INSURANCE

## 2024-09-07 MED ORDER — DEXTROAMPHETAMINE-AMPHETAMINE 20 MG TABLET
20 | ORAL_TABLET | Freq: Every morning | ORAL | 0 refills | 30.00000 days | Status: AC
Start: 2024-09-07 — End: 2024-11-02

## 2024-09-07 MED ORDER — ESCITALOPRAM 20 MG TABLET
20 | ORAL_TABLET | Freq: Every day | ORAL | 3 refills | 90.00000 days | Status: AC
Start: 2024-09-07 — End: ?

## 2024-09-07 MED ORDER — DEXTROAMPHETAMINE-AMPHETAMINE 20 MG TABLET
20 | ORAL_TABLET | Freq: Every morning | ORAL | 0 refills | 30.00000 days | Status: AC
Start: 2024-09-07 — End: 2024-11-17

## 2024-09-07 MED ORDER — DEXTROAMPHETAMINE-AMPHETAMINE 20 MG TABLET
20 | ORAL_TABLET | Freq: Every morning | ORAL | 0 refills | 30.00000 days | Status: AC
Start: 2024-09-07 — End: 2024-12-06

## 2024-09-07 MED ORDER — BUPROPION HCL XL 150 MG 24 HR TABLET, EXTENDED RELEASE
150 | ORAL_TABLET | Freq: Every day | ORAL | 3 refills | 90.00000 days | Status: AC
Start: 2024-09-07 — End: ?

## 2024-09-07 NOTE — Patient Instructions (Signed)
 Encounter Diagnoses   Name Primary?    Attention deficit hyperactivity disorder (ADHD), combined type     Attention or concentration deficit  - doing well with lower dose of adderall   - continue with the current treatment        Depression with anxiety  - trial with adding buproprion Yes

## 2024-09-07 NOTE — Progress Notes (Signed)
 Subjective   HPI:   Patient ID: Mary [name] is a 53 y.o. female.    The following HPI was reviewed and copied forward (with edits) from a note written by me on 05/15/24. I have reviewed and updated the history, physical exam, data, assessment, and plan of the note as detailed below so that it reflects my evaluation and management of the patient.       The patient's chart was reviewed prior to the visit as part of pre-visit planning  No chief complaint on file.     ADD, depn w anx   Mood: doing ok, but feels w lexapro  I might need something different : no specific issues; discussed options, would like to try adding buproprion     Past: AXR 40mg  once daily  Current Adderall  SA form 20mg  bid is trying to gradually taper off: is now down to 20mg  daily    Denies any s/e   Has rec'ed [facility] for MM: uses edibles as needed for anxiety, sleep   Denies diversion or abuse  Ongoing mgt for perimenopause: seeing Gyn [facility]: we discussed possibly chg lexapro  to effexor, can consider in the future          04/29/2022     1:53 PM 06/25/2022     4:05 PM 02/22/2023     2:36 PM 06/17/2023    10:38 AM 10/26/2023     2:42 PM 05/15/2024    10:01 AM 09/07/2024    11:30 AM   Controlled Substance Monitoring   OARRS/eKASPER Status Reviewed Reviewed Reviewed Reviewed Reviewed Reviewed Reviewed   OARRS/eKASPER Consistent with Prescriber Expectation Y Y Y Y Y Y Y   UDS Consistent with Prescriber Expectation      Y Y     I have completed the required OARRS/eKASPER documentation for this patient on 09/07/2024.  Jasenia Weilbacher MARLA Stank, MD    Past Medical History:   Diagnosis Date    ADD (attention deficit disorder)     Allergy     Anemia     Anxiety     Asthma     Cancer (CMS-HCC)     Cervical dysplasia     Chronic back pain     Depression     Diverticulitis     Environmental allergies     Iron deficiency     Varicella     Vitamin D deficiency        Current Outpatient Medications   Medication Sig Dispense Refill     dextroamphetamine -amphetamine  (ADDERALL ) 20 mg Tab Take 1 tablet (20 mg total) by mouth every morning. 30 tablet 0    [START ON 10/07/2024] dextroamphetamine -amphetamine  (ADDERALL ) 20 mg Tab Take 1 tablet (20 mg total) by mouth every morning. 30 tablet 0    [START ON 11/06/2024] dextroamphetamine -amphetamine  (ADDERALL ) 20 mg Tab Take 1 tablet (20 mg total) by mouth every morning. 30 tablet 0    escitalopram  oxalate (LEXAPRO ) 20 MG tablet Take 1 tablet (20 mg total) by mouth daily. 90 tablet 3    ALBUTEROL  90 mcg/actuation Inhl inhaler Inhale 2 puffs into the lungs every 6 hours as needed for Wheezing. 6.7 g 11    azelastine  (ASTELIN ) 137 mcg (0.1 %) nasal spray Use 1 spray into each nostril 2 times a day. Use in each nostril as directed 30 mL 11    buPROPion  XL (WELLBUTRIN  XL) 150 MG 24 hr tablet Take 1 tablet (150 mg total) by mouth daily. 90 tablet 3  cetirizine  (ZYRTEC ) 10 MG tablet Take 1 tablet (10 mg total) by mouth daily as needed for Allergies.      cholecalciferol, vitamin D3, 125 mcg (5,000 unit) Tab Take by mouth.      FERROUS SULFATE (IRON ORAL) Take by mouth.      fluticasone  (FLONASE ) 50 mcg/actuation nasal spray SHAKE LIQUID AND USE 1 TO 2 SPRAYS IN EACH NOSTRIL DAILY AS NEEDED 16 g 0    fluticasone -umeclidin-vilanter (TRELEGY ELLIPTA ) 200-62.5-25 mcg DsDv Inhale 1 puff into the lungs daily. 60 each 5    montelukast  (SINGULAIR ) 10 mg tablet Take 1 tablet (10 mg total) by mouth at bedtime. 90 tablet 3    omeprazole  (PRILOSEC ) 40 MG capsule Take 1 capsule (40 mg total) by mouth every morning before breakfast. 30 capsule 0    polyethylene glycol (GOLYTELY) 236-22.74-6.74 -5.86 gram solution PATIENT IS TO FOLLOW THE [facility] PREP INSTRUCTIONS according to the instructions outlined in your prep letter from [facility]. 4000 mL 0    triamcinolone  (NASACORT ) 55 mcg nasal inhaler Use 2 sprays into each nostril 2 times a day. Indications: please dispense 2 bottles 34 mL 11     No current facility-administered medications for  this visit.          ROS:   Review of Systems  as above   Objective:   Physical Exam      Wt Readings from Last 3 Encounters:   04/19/24 165 lb 3.2 oz (74.9 kg)   02/10/24 166 lb 9.6 oz (75.6 kg)   10/26/23 163 lb (73.9 kg)       There were no vitals filed for this visit.      There is no height or weight on file to calculate BMI.  There is no height or weight on file to calculate BSA.    NAD  Psych: A&Ox3; reactive affect; no obv thought d/o       Lab Results   Component Value Date    WBC 4.6 08/30/2023    HGB 13.3 08/30/2023    HCT 40.0 08/30/2023    MCV 90.0 08/30/2023    PLT 290 08/30/2023       Lab Results   Component Value Date    HGBA1C 5.8 (H) 12/22/2022    HGBA1C 5.9 (H) 10/01/2020     Lab Results   Component Value Date    CREATININE 0.72 12/22/2022       Lab Results   Component Value Date    GLUCOSE 101 (H) 12/22/2022    BUN 11 12/22/2022    CO2 26 12/22/2022    CREATININE 0.72 12/22/2022    K 4.2 12/22/2022    NA 138 12/22/2022    CL 100 12/22/2022    CALCIUM 9.3 12/22/2022    ALBUMIN 4.0 12/22/2022    PROT 7.2 12/22/2022    ALKPHOS 43 12/22/2022    ALT 11 12/22/2022    AST 30 09/30/2017    BILITOT 1.3 12/22/2022       Lab Results   Component Value Date    CHOLTOT 251 (H) 12/22/2022    TRIG 164 (H) 12/22/2022    HDL 75 12/22/2022    LDL 143 12/22/2022       Lab Results   Component Value Date    TSH 2.010 02/10/2024     Lab Results   Component Value Date    VITAMINB12 >1506 (H) 10/01/2020          Upcoming Health Maintenance  Immunization: Pneumococcal (2 of 2 - PCV)  Overdue since 10/04/2009    Alcohol Misuse Screening (Yearly)  Overdue since 10/27/2022    Colorectal Cancer Screening (MyChart) (Cologuard (FIT-DNA) - Every 3 Years)  Overdue since 12/04/2022    ASCVD Assessment (Yearly)  Never done    Comprehensive Physical Exam (Yearly)  Never done    Immunization: DTaP/Tdap/Td (1 - Tdap)  Never done    Immunization: Hepatitis B (1 of 3 - 19+ 3-dose series)  Never done    Immunization: Zoster (1 of 2)   Never done    Immunization: Influenza (MyChart) (1)  Order placed this encounter    Depression Monitoring (PHQ-9) (Yearly)  Next due on 10/25/2024    Mammogram (MyChart) (Every 2 Years)  Next due on 03/30/2025    Diabetes Screening (Every 3 Years)  Next due on 12/21/2025    Cervical Cancer Screening/Pap Smear (MyChart) (Every 5 Years)  Next due on 08/17/2028           Immunization History   Administered Date(s) Administered    COVID-19, mRNA, Moderna monovalent, age 36+ 04/20/2021, 05/18/2021    Influenza, quadrivalent, intradermal, preservative-free 07/04/2017    Influenza, quadrivalent, preservative-free 08/06/2015, 06/25/2016, 09/11/2018, 09/30/2020    Influenza, unspecified 05/31/2008    Pneumococcal polysaccharide, 23-valent 10/04/2008         Assessment/Plan:       [id number]    This was a Video visit, including two-way audio and video communication, in lieu of an in-person visit. The patient provided verbal consent to participate in the telehealth visit. The patient location for this telehealth visit is in home. I spent 12 minutes speaking with the patient, conducting an interview, performing a limited exam, and educating the patient on my assessment and plan. I also spent 6 minutes, on the same day as the encounter, preparing to see the patient (eg, review of tests), ordering medications, tests, or procedures, documenting clinical information in the electronic or other health record, and performing non-face-to-face activities.   A/P:  Patient Instructions     Encounter Diagnoses   Name Primary?    Attention deficit hyperactivity disorder (ADHD), combined type     Attention or concentration deficit  - doing well with lower dose of adderall   - continue with the current treatment        Depression with anxiety  - trial with adding buproprion Yes            RTC: 3 months    New meds:   none  Patient education given  none  Patient verbalized understanding & agreement of the proposed plan.

## 2024-09-13 NOTE — ADT Alerts (Signed)
 This is a notification of a Admission Alert generated from an ADT received from [employer]. This patient was admited to:[facility] Admit Date:09/13/2024 0602 A.M. 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

## 2024-09-13 NOTE — Unmapped External Note (Signed)
 Post-Anesthesia Follow-Up Note      Mary [name] has met the following Anesthesia criteria for discharge:                    Vitals:    Blood Pressure:  BP Readings from Last 1 Encounters:   09/13/24 128/71         Heart Rate:   Pulse Readings from Last 1 Encounters:   09/13/24 88         Respirations:   Resp Readings from Last 1 Encounters:   09/13/24 17           O2 Sat:   SpO2 Readings from Last 1 Encounters:   09/13/24 97%       Temp:   Temp Readings from Last 1 Encounters:   09/13/24 97.8 ??F (36.6 ??C) (Temporal)         -  Cardiorespiratory status has returned to baseline, and/or is acceptable.  -  Pt responding verbally and following commands to acceptable level.  -  Temperature and hydration status acceptable.  -  Pain and nausea appropriately managed.  -  No anesthesia complications apparent at this time.

## 2024-09-13 NOTE — OR Surgeon (Signed)
 [facility]  Procedure Note  [facility]      Mary [name]  1971-03-09    DOS:   09/13/2024    Pre-operative Diagnosis: the Left Thumb Carpometacarpal Joint Arthritis    Post-operative Diagnosis: same    Procedure:    1. the Left Thumb Carpometacarpal joint arthroplasty   2. Flexor Carpi Radialis tendon interposition transfer to [facility] joint   3. Excision of the Left trapezium   4. Intraoperative interpretation 3 views of the left  hand    Surgeon: Elvina Oneil PARAS., MD    Assistant: Manley SA       Anesthesia:  General/Regional    Estimated blood loss:  5ml    Complications:  None immediate apparent       Implants: none    Specimens: none      INDICATIONS FOR PROCEDURE: The patient has degenerative arthritis of the [facility] joint of the left thumb   and has failed appropriate conservative care.  The patient understands the relevant risks, benefits, limitations, alternatives, and healing process of the procedure.      PROCEDURE: The patient was given IV antibiotics,  and brought to the operating room and anesthetized with general anesthesia.  The identified and marked extremity was then prepped and draped in a standard fashion.  A well-padded tourniquet was applied to the left  upper arm.  The arm was exsanguinated and the tourniquet elevated to 250 mmHg.      We started the procedure with the left  thumb CMC arthroplasty     A standard chevron incision was made over the [facility] joint of the left  thumb.  Dissection carried through skin, subcutaneous tissue, and careful attention was paid to protect cutaneous nerve branches as possible. The [facility] joint was identified, and the trapezium was split in half with an osteotome.  Both halves of the trapezium were excised with care undertaken to optimally protect the FCR at the base of the trapezial space. Any residual loose bodies or prominent spurs were also removed and contoured from the trapezium as well as the base of thumb  metacarpal.     Once the trapezium had been completely excise images obtained demonstrated complete removal of the trapezium as well as bone spurs  The FCR tendon was identified at the base of the wound.  We split the tendon longitudinally to harvest approximately 50% of the tendon for later ligament reconstruction.     Two separate volar incisions were made over the forearm to harvest the FCR tendon.  It was advanced to the [facility] arthroplasty space after the tendon was split longitudinally.  The tendon was then used for support by using 2.62mm drill bit at the dorsal radial as well as base of the metacarpal to create a T-shaped tunnel  The tendon was then advanced up through the tunnel using a Houson suture Passer.  The tendon was then weaved around the remaining intact FCR tendon 2-3 times  in order to form additional support.  Appropriate tension was applied to the thumb as well as care taken to appropriately position the thumb base followed by securing the tendon repair in place with multiple ethibond  2.0 sutures    The remaining tendon was fashioned into spacer and placed into the arthroplasty space.     The wound was copiously irrigated with normal saline irrigation    The capsule was closed with vicryl suture.  Position of the arthroplasty was checked with  xray.       The wounds were irrigated with sterile saline and hemostasis achieved with electrocautery.  Closure was performed with Nylon at skin and subcutaneous Vicryl.  A post-op dressing and thumb spica splint was applied and the patient transported to the recovery area in stable condition with good perfusion to all fingertips.       Addendum:  It should be noted that 3 views of the operative left  hand were performed with mini C-Arm fluoroscopy for the arthroplasty.  AP, lateral, and oblique views demonstrated satisfactory alignment of the arthroplasty.        Postoperative plan:  Keep splint clean dry intact   Okay to begin finger gentle range of  motion  Follow up in clinic in 7-10 days for wound check and referral to hand OT for protocol [facility] arthroplasty            Elvina Oneil PARAS., MD

## 2024-09-13 NOTE — ADT Alerts (Signed)
 This is a notification of a Discharge Alert generated from an ADT received from [employer]. This patient was admited to:[facility] Admit Date:09/13/2024 0602 A.M. Discharge Date:09/13/2024 1117 A.M. Visit Type:Inpatient Diagnosis:Unilateral   primary osteoarthritis of first carpometacarpal joint, left hand

## 2024-09-20 NOTE — Progress Notes (Signed)
 Unable to confirm procedure on 10/09/23, patient had google assistant but phone did not go to vm when there was no answer     09/20/24 1037   Pre-op Phone Call   Call Start Time 1037   Surgery Time Verified No   Arrival Time Verified 1230

## 2024-09-25 NOTE — Progress Notes (Signed)
 Splint Modification  09/25/2024  Mary [name] DOB: Dec 03, 1970 MRN: [id number]   Referring Physician: Elvina Oneil PARAS, MD  Medical Diagnosis Information:   Encounter Diagnosis   Name Primary?   ??? Arthritis of carpometacarpal [facility]) joint of left thumb Yes     Next MD Visit: 10/23/24    Subjective:  Reports splint is too loose due to decreased edema    Objective:  Observations/Current splint fit: Good fit following remolding of splint.     Treatment:   Splint modifications made, including Reheated splint and adjusted to improve fit to eliminate migration and excessive wrist movement    Assessment:   Good splint fit/function noted.    Plan:    Patient to return for full evaluation and initiation of treatment    Signature:  Resa Shad, OT  Date:  09/25/2024    [name]  License: [id number]

## 2024-10-02 NOTE — Progress Notes (Signed)
 10/02/24 0844   Pre-op Phone Call   Surgery Time Verified Yes   Arrival Time Verified 1230   Surgery Location Verified Yes   Remind patient to bring picture ID and insurance card Yes   NPO Status Reinforced Yes   Ride and Caregiver Arranged Yes   Ride Caregiver Provider husband   Prep Reviewed? Yes   Prep Picked Up? No  (Encouraged to pick up today)   Instructions Understood? Yes     Spoke with Pt to confirm colo appointment on 10/08/24. Pt reminded to pick up prep, discussed clears diet/prep instructions/medications, and has ride arranged for afterwards. Pt has no further questions at this time and advised to call if some come about.

## 2024-10-08 ENCOUNTER — Ambulatory Visit: Admit: 2024-10-08 | Payer: PRIVATE HEALTH INSURANCE | Attending: Gastroenterology

## 2024-10-08 LAB — POC HCG QUALITATIVE, URINE: hCG Qualitative -Clinitek: NEGATIVE

## 2024-10-08 MED ORDER — LIDOCAINE (PF) 20 MG/ML (2 %) INTRAVENOUS SOLUTION
20 | INTRAVENOUS | PRN
Start: 2024-10-08 — End: 2024-10-08
  Administered 2024-10-08: 14:00:00 via INTRAVENOUS

## 2024-10-08 MED ORDER — PROPOFOL 10 MG/ML 20 ML INFUSION FOR OR ONLY
10 | INTRAVENOUS | PRN
Start: 2024-10-08 — End: 2024-10-08
  Administered 2024-10-08: 14:00:00 via INTRAVENOUS

## 2024-10-08 MED ORDER — SODIUM CHLORIDE 0.9 % INTRAVENOUS SOLUTION
INTRAVENOUS
Start: 2024-10-08 — End: 2024-10-08
  Administered 2024-10-08: 13:00:00 via INTRAVENOUS

## 2024-10-08 MED ORDER — PROPOFOL 10 MG/ML FOR OR USE ONLY
10 | INTRAVENOUS | PRN
Start: 2024-10-08 — End: 2024-10-08
  Administered 2024-10-08 (×4): via INTRAVENOUS

## 2024-10-08 MED FILL — SODIUM CHLORIDE 0.9 % INTRAVENOUS SOLUTION: 50.0000 50.0000 mL/hr | INTRAVENOUS | Qty: 1000 | Fill #0

## 2024-10-08 NOTE — Discharge Instructions (Addendum)
 For any questions/concerns please call the office of Dr. Verl Bangs @ [phone]    PATIENT INSTRUCTIONS  POST-ANESTHESIA     IMMEDIATELY FOLLOWING SURGERY:  Do not drive or operate machinery for the first 24 hours after surgery.  Do not make any important decisions for 24 hours after surgery or while taking narcotic pain medications or sedatives.  Avoid intake of alcohol for 24 hours  If you develop intractable nausea and vomiting or a severe headache please notify your doctor immediately.  You may feel light-headed, dizzy, or sleepy following surgery.  DO NOT STAY ALONE.  A responsible person should stay with you overnight.     QUESTIONS?:  Please feel free to call your surgeon, primary care physician, or the hospital operator [phone]) if you have any questions.

## 2024-10-08 NOTE — Anesthesia Postprocedure Evaluation (Signed)
 Anesthesia Post Note    Patient: Mary [name]    Procedure(s) Performed: Procedure(s):  COLONOSCOPY W/ OR W/O BIOPSY  COLONOSCOPY LOWER POLYP REMOVAL SNARE OR CAUTERY  COLONOSCOPY WITH COLD FORCEP POLYPECTOMY    Anesthesia type: MAC        Patient location:  Same Day Surgery     Airway patency:  Patent     Pain management:  Adequate     PONV status:  None     Hydration status:  Adequate     Post assessment:  Tolerated procedure well and no apparent anesthetic complications     Level of consciousness:  Awake     Post vitals:  Stable         Last Vitals:   Vitals:    10/08/24 1317 10/08/24 1445 10/08/24 1500 10/08/24 1515   BP: 120/85 119/72 136/77 121/70   BP Location:  Left upper arm     Patient Position:  Lying     BP Cuff Size:  Regular     Pulse: 76 70 67 63   Resp: 14 13 17 18    Temp:  97.4 ??F (36.3 ??C)     TempSrc:  Oral     SpO2: 96% 99% 98% 97%        Last Temperature: 97.4 ??F (36.3 ??C) (10/08/2024  2:45 PM)      Complications:  There were no known notable events for this encounter.

## 2024-10-08 NOTE — Progress Notes (Signed)
"  Pt received from endoscopy in stable condition. Pt tolerating po intake. MD in to discuss results with patient following procedure.    Discharge instructions reviewed with family/pt, all questions and concerns addressed.    Pt assisted OOB, no dizziness.     Ok to discharge per anesthesia. PIV removed.     DERICK SNELL, RN    "

## 2024-10-08 NOTE — Anesthesia Preprocedure Evaluation (Signed)
 [facility]  DEPARTMENT OF ANESTHESIOLOGY  PRE-PROCEDURAL EVALUATION    Mary [name] is a 54 y.o. year old female presenting for:    Procedure(s):  COLONOSCOPY W/ OR W/O BIOPSY    Surgeon:   Rosanne Marie Danielson, MD    Chief Complaint         Review of Systems     Anesthesia Evaluation    Patient summary reviewed, nursing notes reviewed and Previous anesthetic and pre-operative note copied, reviewed and updated as necessary.  All other systems reviewed and are negative.     I have reviewed the History and Physical Exam, any relevant changes are noted in the anesthesia pre-operative evaluation.      Cardiovascular:    Exercise tolerance: good  Duke Met score: 5 - Walking four miles per hour. Social dancing. Washing a car.    Neuro/Muscoloskeletal/Psych:    (+) psychiatric history (ADD), anxiety and depression.       Comments: S/p trapeziectomy Lt 09/13/24 healing well    Pulmonary:    (+) asthma (controlled on inhalers, no ER visits nor hospitalizations).           ROS comment: PFTs 10/16  Obstructive defect is present.  Spirometry shows moderately severe obstructive defect.  There is a significant response to bronchodilator demonstrated.  Air trapping is present.  Diffusing capacity is elevated and may suggest asthma      GI/Hepatic/Renal:              Comments: Diverticulitis    Endo/Other:    (+) anemia (Fe deficiency).         Comments: Calcification Rt breast  Endometrial polyp         Past Medical History     Past Medical History:   Diagnosis Date    ADD (attention deficit disorder)     Allergy     Anemia     Anxiety     Asthma     Cancer (CMS-HCC)     Cervical dysplasia     Chronic back pain     Depression     Diverticulitis     Environmental allergies     Iron deficiency     Varicella     Vitamin D deficiency        Past Surgical History     Past Surgical History:   Procedure Laterality Date    ARTHROSCOPY KNEE MENISECTOMY, MENISCAL REPAIR Right 2013    right    AUGMENTATION BREAST ENDOSCOPIC  2001     [place], [place]  implant replacement due to left implant rupture     AUGMENTATION MAMMAPLASTY  age 47-24    [place], [place]    BIOPSY BREAST NEEDLE LOCALIZATION Right 06/02/2016    Procedure: RIGHT BREAST NEEDLE LOCALIZED EXCISIONAL BIOPSY OF 2 AREAS,   ;  Surgeon: Ami JONETTA Kerns, MD;  Location: [facility] OR;  Service: General;  Laterality: Right;    BREAST EXCISIONAL BIOPSY      atypical cells    CERVICAL CONE BIOPSY      COSMETIC SURGERY      ENDOMETRIAL BIOPSY      benign polyp    FUNCTONAL ENDO SINUS SURGERY WITH LANDMARK Bilateral 02/03/2022    Procedure: endoscopic sinus surgery with septoplasty and inferior turbinate reductions.;  Surgeon: Dominique Section, MD;  Location: [facility] OR;  Service: ENT;  Laterality: Bilateral;    HYSTEROSCOPY ABLATION ENDOMETRIAL N/A 06/02/2016    Procedure: HYSTEROSCOPIC RESECTION OF ENDOMETRIAL POLYP;  Surgeon: Fairy Reams;  Location: [facility] OR;  Service: Gynecology;  Laterality: N/A;    uterine biopsy       x2       Family History     Family History   Problem Relation Age of Onset    Uterine Cancer Mother 55        uterine vs cervical     Hypertension Father     Asthma Father     Alcohol abuse Father     Lung Cancer Father     Colon polyps Father     Bone Cancer Paternal Grandmother 54    Asthma Other     Allergies Other     Melanoma Neg Hx     Breast Cancer Neg Hx     Ovarian cancer Neg Hx     Colon Cancer Neg Hx     Pancreatic Cancer Neg Hx     Prostate Cancer Neg Hx     Anesthesia problems Neg Hx        Social History     Social History     Socioeconomic History    Marital status: Married     Spouse name: Not on file    Number of children: Not on file    Years of education: Not on file    Highest education level: Not on file   Occupational History    Not on file   Tobacco Use    Smoking status: Never    Smokeless tobacco: Never    Tobacco comments:     02/24/2011; passive smoke exposure:no (06/18/2005)   Vaping Use    Vaping status: Never Used   Substance and Sexual Activity    Alcohol use: Yes      Alcohol/week: 4.0 standard drinks of alcohol     Types: 4 Glasses of wine per week     Comment: socially    Drug use: Yes     Types: Medical Marijuana     Comment: 01/07/2011    Sexual activity: Yes     Partners: Female     Comment: vasectomy   Other Topics Concern    Caffeine Use Yes    Occupational Exposure No    Exercise No    Seat Belt Yes   Social History Narrative    Currently not working. Lives in a newer home with husband and 2 kids, no known mold in the home, no pets at home, no occupational exposures.      Social Drivers of Psychologist, [employer] Strain: Not on file   Food Insecurity: No Food Insecurity (09/07/2024)    Yearly Questionnaire     Do you need any assistance with obtaining housing, meals, medication, transportation or medical equipment?: No     Assistance needed for:: Not on file   Transportation Needs: No Transportation Needs (09/07/2024)    Yearly Questionnaire     Do you need any assistance with obtaining housing, meals, medication, transportation or medical equipment?: No     Assistance needed for:: Not on file   Physical Activity: Not on file   Stress: Not on file   Social Connections: Not on file   Intimate Partner Violence: Not At Risk (10/08/2024)    Humiliation, Afraid, Rape, and Kick questionnaire     Fear of Current or Ex-Partner: No     Emotionally Abused: No     Physically Abused: No     Sexually Abused: No   Housing Stability: Low Risk (09/07/2024)  Yearly Questionnaire     Do you need any assistance with obtaining housing, meals, medication, transportation or medical equipment?: No     Assistance needed for:: Not on file       Medications     Allergies:  No Known Drug Allergies or Adverse Reactions    Home Meds:  Prior to Admission medications as of 02/03/22 0936   Medication Sig Taking?   albuterol  (PROAIR  HFA) 90 mcg/actuation inhaler Inhale 2 puffs into the lungs every 6 hours as needed for Wheezing.    azelastine  (ASTELIN ) 137 mcg (0.1 %) nasal spray Use 1 spray into each  nostril 2 times a day. Use in each nostril as directed    budesonide -formoteroL  (SYMBICORT ) 160-4.5 mcg/actuation inhaler Inhale 1 puff into the lungs 2 times a day.    cetirizine  (ZYRTEC ) 10 MG tablet Take 1 tablet (10 mg total) by mouth daily as needed for Allergies.    cholecalciferol, vitamin D3, 125 mcg (5,000 unit) Tab Take by mouth.    dextroamphetamine -amphetamine  (ADDERALL  XR) 20 MG 24 hr capsule Take 2 capsules (40 mg total) by mouth every morning for 30 days.    dextroamphetamine -amphetamine  (ADDERALL  XR) 20 MG 24 hr capsule Take 2 capsules (40 mg total) by mouth every morning for 30 days.    dextroamphetamine -amphetamine  (ADDERALL  XR) 20 MG 24 hr capsule Take 2 capsules (40 mg total) by mouth every morning for 30 days.    dextroamphetamine -amphetamine  (ADDERALL ) 20 mg Tab Take 1 tablet (20 mg total) by mouth in the morning and at bedtime for 30 days.    escitalopram  oxalate (LEXAPRO ) 20 MG tablet Take 1 tablet (20 mg total) by mouth daily.    FERROUS SULFATE (IRON ORAL) Take by mouth.    fluticasone  (FLONASE ) 50 mcg/actuation nasal spray SHAKE LIQUID AND USE 1 TO 2 SPRAYS IN EACH NOSTRIL DAILY AS NEEDED    montelukast  (SINGULAIR ) 10 mg tablet Take 1 tablet (10 mg total) by mouth at bedtime.    predniSONE  (DELTASONE ) 20 MG tablet 1 by mouth twice daily x 3 days then 1 by mouth once daily x 4 days  Patient taking differently: 1 by mouth twice daily x 3 days then 1 by mouth once daily x 4 days  As needed        Inpatient Meds:  Scheduled:     Continuous:       PRN:     Vital Signs     Wt Readings from Last 3 Encounters:   04/19/24 165 lb 3.2 oz (74.9 kg)   02/10/24 166 lb 9.6 oz (75.6 kg)   10/26/23 163 lb (73.9 kg)     Ht Readings from Last 3 Encounters:   04/19/24 5' 6 (1.676 m)   02/10/24 5' 6 (1.676 m)   10/26/23 5' 6 (1.676 m)     Temp Readings from Last 3 Encounters:   02/03/22 97 ??F (36.1 ??C)   01/22/22 98.6 ??F (37 ??C) (Temporal)     BP Readings from Last 3 Encounters:   10/08/24 115/83   04/19/24  147/88   02/10/24 153/86     Pulse Readings from Last 3 Encounters:   10/08/24 78   04/19/24 76   02/10/24 74     SpO2 Readings from Last 3 Encounters:   06/02/16 98%   05/10/16 98%   02/27/16 98%       Physical Exam     Airway:     Mallampati: I  Mouth Opening: >2 FB  TM  distance: > = 3 FB  Neck ROM: full    Dental:   - No obvious cracked, loose, chipped, or missing teeth.     Pulmonary:   - normal exam      Breath sounds clear to auscultation.       Cardiovascular:  - normal exam   Rhythm: regular    Neuro/Musculoskeletal/Psych:      Abdominal:       Current OB Status:       Other Findings:        Laboratory Data     Lab Results   Component Value Date    WBC 4.6 08/30/2023    HGB 13.3 08/30/2023    HCT 40.0 08/30/2023    MCV 90.0 08/30/2023    PLT 290 08/30/2023       No results found for: [facility]    Lab Results   Component Value Date    GLUCOSE 101 (H) 12/22/2022    BUN 11 12/22/2022    CO2 26 12/22/2022    CREATININE 0.72 12/22/2022    K 4.2 12/22/2022    NA 138 12/22/2022    CL 100 12/22/2022    CALCIUM 9.3 12/22/2022    ALBUMIN 4.0 12/22/2022    PROT 7.2 12/22/2022    ALKPHOS 43 12/22/2022    ALT 11 12/22/2022    AST 30 09/30/2017    BILITOT 1.3 12/22/2022       No results found for: PTT, INR    Lab Results   Component Value Date    PREGTESTUR Negative 10/08/2024       Anesthesia Plan     ASA 2     Anesthesia Type:  MAC.   PONV Risk Factors: female, current non-smoker,  plan for postoperative opioid use.   Induction: Intravenous induction.    Plan, alternatives, and risks of anesthesia, including death, have been explained to and discussed with patient. Questions answered / anesthesia plan accepted.     Use of blood products discussed with who consented to blood products.      Plan discussed with CRNA.

## 2024-10-08 NOTE — H&P (Signed)
 Pre-sedation Endoscopy History and Physical      10/08/2024  1:07 PM    Mary [name] is an 54 y.o. female who is here for a   COLONOSCOPY W/ OR W/O BIOPSY     Reason for exam:  screening    Problem List[1]    History:  Past Medical History:   Diagnosis Date    ADD (attention deficit disorder)     Allergy     Anemia     Anxiety     Asthma     Cancer (CMS-HCC)     Cervical dysplasia     Chronic back pain     Depression     Diverticulitis     Environmental allergies     Iron deficiency     Varicella     Vitamin D deficiency      Past Surgical History:   Procedure Laterality Date    ARTHROSCOPY KNEE MENISECTOMY, MENISCAL REPAIR Right 2013    right    AUGMENTATION BREAST ENDOSCOPIC  2001    [place], [place]  implant replacement due to left implant rupture     AUGMENTATION MAMMAPLASTY  age 47-24    [place], [place]    BIOPSY BREAST NEEDLE LOCALIZATION Right 06/02/2016    Procedure: RIGHT BREAST NEEDLE LOCALIZED EXCISIONAL BIOPSY OF 2 AREAS,   ;  Surgeon: Ami JONETTA Kerns, MD;  Location: [facility] OR;  Service: General;  Laterality: Right;    BREAST EXCISIONAL BIOPSY      atypical cells    CERVICAL CONE BIOPSY      COSMETIC SURGERY      ENDOMETRIAL BIOPSY      benign polyp    FUNCTONAL ENDO SINUS SURGERY WITH LANDMARK Bilateral 02/03/2022    Procedure: endoscopic sinus surgery with septoplasty and inferior turbinate reductions.;  Surgeon: Dominique Section, MD;  Location: [facility] OR;  Service: ENT;  Laterality: Bilateral;    HYSTEROSCOPY ABLATION ENDOMETRIAL N/A 06/02/2016    Procedure: HYSTEROSCOPIC RESECTION OF ENDOMETRIAL POLYP;  Surgeon: Fairy Reams;  Location: [facility] OR;  Service: Gynecology;  Laterality: N/A;    uterine biopsy       x2     Family History   Problem Relation Age of Onset    Uterine Cancer Mother 42        uterine vs cervical     Hypertension Father     Asthma Father     Alcohol abuse Father     Lung Cancer Father     Colon polyps Father     Bone Cancer Paternal Grandmother 57    Asthma Other     Allergies Other     Melanoma Neg Hx      Breast Cancer Neg Hx     Ovarian cancer Neg Hx     Colon Cancer Neg Hx     Pancreatic Cancer Neg Hx     Prostate Cancer Neg Hx     Anesthesia problems Neg Hx      Social History[2]  Allergies[3]      Home Medications:   Home Medications   Medication Sig Taking? Last Dose   ALBUTEROL  90 mcg/actuation Inhl inhaler Inhale 2 puffs into the lungs every 6 hours as needed for Wheezing.     azelastine  (ASTELIN ) 137 mcg (0.1 %) nasal spray Use 1 spray into each nostril 2 times a day. Use in each nostril as directed     buPROPion  XL (WELLBUTRIN  XL) 150 MG 24 hr tablet Take 1 tablet (150 mg total)  by mouth daily.     cetirizine  (ZYRTEC ) 10 MG tablet Take 1 tablet (10 mg total) by mouth daily as needed for Allergies.     cholecalciferol, vitamin D3, 125 mcg (5,000 unit) Tab Take by mouth.     dextroamphetamine -amphetamine  (ADDERALL ) 20 mg Tab Take 1 tablet (20 mg total) by mouth every morning.     dextroamphetamine -amphetamine  (ADDERALL ) 20 mg Tab Take 1 tablet (20 mg total) by mouth every morning.     dextroamphetamine -amphetamine  (ADDERALL ) 20 mg Tab Take 1 tablet (20 mg total) by mouth every morning.     escitalopram  oxalate (LEXAPRO ) 20 MG tablet Take 1 tablet (20 mg total) by mouth daily.     FERROUS SULFATE (IRON ORAL) Take by mouth.     fluticasone  (FLONASE ) 50 mcg/actuation nasal spray SHAKE LIQUID AND USE 1 TO 2 SPRAYS IN EACH NOSTRIL DAILY AS NEEDED     fluticasone -umeclidin-vilanter (TRELEGY ELLIPTA ) 200-62.5-25 mcg DsDv Inhale 1 puff into the lungs daily.     montelukast  (SINGULAIR ) 10 mg tablet Take 1 tablet (10 mg total) by mouth at bedtime.     omeprazole  (PRILOSEC ) 40 MG capsule Take 1 capsule (40 mg total) by mouth every morning before breakfast.     polyethylene glycol (GOLYTELY) 236-22.74-6.74 -5.86 gram solution PATIENT IS TO FOLLOW THE COLO PREP INSTRUCTIONS according to the instructions outlined in your prep letter from [facility].     triamcinolone  (NASACORT ) 55 mcg nasal inhaler Use 2 sprays into each  nostril 2 times a day. Indications: please dispense 2 bottles         ROS:  Chest pain?  No  Shortness of breath?   No      IV site verified?   Yes    Vitals:  There were no vitals taken for this visit.    Physical Exam:  Neuro:  Alert and Oriented x 4, Follows commands  Cardio:  Regular rate and rhythm, no murmurs/rub/gallop  Pulm:  Clear to auscultation bilaterally, no wheezes, rhonchi, rub  Abdomen:  Soft, nontender, nondistended.  Positive bowel sounds.      Assessment/Plan:  COLONOSCOPY W/ OR W/O BIOPSY     Sedation plan (type/medications): Moderate Sedation w propofol         Patient / Surrogate has consented for the above procedure.  Yes     This patient is in satisfactory condition for the procedure and sedation/anesthesia.   Yes     This patient was reevaluated immediately prior to procedure/sedation.  Any changes are noted below.        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

## 2024-10-08 NOTE — Transfer of Care (Signed)
 Anesthesia Transfer of Care Note    Patient: Mary [name]  Procedure(s) Performed: Procedure(s):  COLONOSCOPY W/ OR W/O BIOPSY  COLONOSCOPY LOWER POLYP REMOVAL SNARE OR CAUTERY  COLONOSCOPY WITH COLD FORCEP POLYPECTOMY    Patient location: Same Day Surgery    Anesthesia type: MAC    Airway Device on Arrival to PACU/ICU: Room Air    IV Access: Peripheral    Monitors Recommended to be Used During PACU/ICU: Standard Monitors    Outstanding Issues to Address: None    Level of Consciousness: awake, alert , and oriented    Post vital signs:    Vitals:    10/08/24 1445   BP: 119/72   Pulse: 70   Resp: 13   Temp: 97.4 ??F (36.3 ??C)   SpO2: 99%       Complications:  No notable events documented.    Date 10/07/24 0700 - 10/08/24 0659(Not Admitted) 10/08/24 0700 - 10/09/24 0659   Shift 0700-1459 1500-2259 2300-0659 24 Hour Total 0700-1459 1500-2259 2300-0659 24 Hour Total   INTAKE   I.V.     500   500     Volume (mL) (sodium chloride  0.9 % IV infusion)     500   500   Shift Total     500   500   OUTPUT   Shift Total           Weight (kg)

## 2024-10-08 NOTE — Op Note (Signed)
 [facility]  GI   _______________________________________________________________________________  Procedure Date: 10/08/2024 2:13 PM      Patient Name: Mary [name]  MRN: [id number]                         Account Number: [id number]  Date of Birth: 10/30/1970              Admit Type: Outpatient  Age: 54                               Gender: Female  Note Status: Finalized                Attending MD: Rumalda Earnie Mealing , MD,   [id number]  _______________________________________________________________________________     Procedure:               Colonoscopy  Indications:             Screening for colorectal malignant neoplasm  Patient Profile:         This is a 54 year old female.  Providers:               Rumalda Earnie Mealing, MD  Referring MD:            Rumalda Earnie Mealing, MD  Medicines:               Monitored Anesthesia Care  Complications:           No immediate complications. Estimated blood loss:                            Minimal.  _______________________________________________________________________________  Procedure:               Pre-Anesthesia Assessment:                           - ASA Grade Assessment: II - A patient with mild                            systemic disease.                           - Prior Anticoagulants: The patient has taken no                            anticoagulant or antiplatelet agents.                           After I obtained informed consent, the scope was                            passed under direct vision. Throughout the                            procedure, the patient's blood pressure, pulse, and                            oxygen saturations were monitored continuously. The  Colonoscope was introduced through the anus and                            advanced to the cecum, identified by appendiceal                            orifice and ileocecal valve. The patient tolerated                            the procedure well. The  terminal ileum, ileocecal                            valve, appendiceal orifice, and rectum were                            photographed. The colonoscopy was performed without                            difficulty. The quality of the bowel preparation was                            good.                                                                                   Findings:       Four sessile polyps were found in the descending colon and ascending        colon. The polyps were diminutive (1-3 mm) in size. These polyps were        removed with a cold biopsy forceps. Resection and retrieval were        complete.       Three sessile, non-bleeding polyps were found in the recto-sigmoid colon        and ascending colon. The polyps were 5 to 8 mm in size. These polyps        were removed with a cold snare. Resection and retrieval were complete.       Multiple medium-mouthed diverticula were found in the sigmoid colon and        descending colon.       Non-bleeding external and internal hemorrhoids were found during        retroflexion. The hemorrhoids were medium-sized.                                                                                   Estimated Blood Loss:       Estimated blood loss was minimal.  Impression:              -  Four diminutive (1-3 mm) polyps in the descending                            colon and in the ascending colon, removed with a                            cold biopsy forceps. Resected and retrieved.                           - Three 5 to 8 mm, non-bleeding polyps at the                            recto-sigmoid colon and in the ascending colon,                            removed with a cold snare. Resected and retrieved.                           - Diverticulosis in the sigmoid colon and in the                            descending colon.                           - Non-bleeding internal hemorrhoids.  Recommendation:          - Discharge patient to home.                           -  Patient has a contact number available for                            emergencies. The signs and symptoms of potential                            delayed complications were discussed with the                            patient. Return to normal activities tomorrow.                            Written discharge instructions were provided to the                            patient.                           - Repeat colonoscopy 3-7 years for surveillance.  Attending Participation: I was present and participated during the entire                            procedure, including non-key portions.  Procedure Code(s):       --- Professional ---                           [phone], 33, Colonoscopy, flexible; with removal of                            tumor(s), polyp(s), or other lesion(s) by snare                            technique                           45380, 59,33, Colonoscopy, flexible; with biopsy,                            single or multiple  Diagnosis Code(s):       --- Professional ---       Z12.11, Encounter for screening for malignant neoplasm of colon       D12.4, Benign neoplasm of descending colon       D12.7, Benign neoplasm of rectosigmoid junction       D12.2, Benign neoplasm of ascending colon       K64.8, Other hemorrhoids       K57.30, Diverticulosis of large intestine without perforation or abscess        without bleeding                                                                                     CPT copyright 2023 American Medical Association. All rights reserved.    The codes documented in this report are preliminary and upon coder review may   be revised to meet current compliance requirements.    Rose Madison Direnzo,MD  ________________________  Rumalda Earnie Mealing, MD  10/08/2024 2:50:19 PM  This report has been signed electronically.Rose Jian Hodgman , MD  Scope Withdrawal Time 0 hours 15 minutes 50 seconds   Total  Procedure Duration Time 0 hours 22 minutes 42 seconds   Scope In: 2:17:47 PM  Scope Out: 2:40:29 PM

## 2024-10-08 NOTE — Post Briefing (Signed)
 Endoscopy  Post Procedure Briefing Note: Mary [name]      Specimens:   Specimens       ID Source Type Tests Collected By Collected At Frozen? Attributes Order ID Breast Spec Formalin Marked as Sent    A Colon Tissue SURGICAL PATHOLOGY EXAM   [name], MD 10/08/24 1431 No Sent in Formalin [id number]        Comment: 2 removed with cold snare  1 removed with cold forceps    B Colon Tissue SURGICAL PATHOLOGY EXAM   [name], MD 10/08/24 1431 No Sent in Formalin [id number]        Comment: 3 removed with cold forceps     C Rectum Tissue SURGICAL PATHOLOGY EXAM   [name], MD 10/08/24 1437 No Sent in Formalin [id number]        Comment: 1 removed with cold snare            Prior to leaving the room the nurse confirmed procedure, specimen labeling, prep and diagnosis with MD. Colonoscopy complete. Report called. Patient to recover with same day nurse. VSS   Are there any recovery management concerns or post procedure orders?   No        Signed: Darryle Breen, RN    Date: 10/08/2024    Time: 2:41 PM

## 2024-10-14 NOTE — Progress Notes (Signed)
 Follow up colonoscopy in 3 years.    Electronically signed by: [name], MD, 10/14/2024 8:39 PM

## 2024-10-15 MED FILL — FLUTICASONE FUR. 200 MCG-UMECLID 62.5 MCG-VILANT 25 MCG INHALAT.POWDER: 200-62.5-25 200-62.5-25 mcg | RESPIRATORY_TRACT | 30 days supply | Qty: 60 | Fill #3

## 2024-11-02 ENCOUNTER — Ambulatory Visit: Admit: 2024-11-02 | Payer: PRIVATE HEALTH INSURANCE

## 2024-11-02 ENCOUNTER — Ambulatory Visit: Admit: 2024-11-02 | Discharge: 2024-11-06 | Payer: PRIVATE HEALTH INSURANCE

## 2024-11-02 DIAGNOSIS — R233 Spontaneous ecchymoses: Principal | ICD-10-CM

## 2024-11-02 LAB — PROTIME-INR
INR: 0.9 (ref 0.9–1.1)
Protime: 12.6 s (ref 12.1–15.1)

## 2024-11-02 LAB — DIFFERENTIAL
Basophils Absolute: 45 /uL (ref 0–108)
Basophils Relative: 0.9 % (ref 0.0–1.0)
Eosinophils Absolute: 230 /uL (ref 0–864)
Eosinophils Relative: 4.6 % (ref 0.0–8.0)
Lymphocytes Absolute: 1175 /uL (ref 570–4860)
Lymphocytes Relative: 23.5 % (ref 15.0–45.0)
Monocytes Absolute: 365 /uL (ref 0–1296)
Monocytes Relative: 7.3 % (ref 0.0–12.0)
Neutrophils Absolute: 3185 /uL (ref 1520–8640)
Neutrophils Relative: 63.7 % (ref 40.0–80.0)
nRBC: 0 /100{WBCs} (ref 0–0)

## 2024-11-02 LAB — CBC
Hematocrit: 39.5 % (ref 35.0–45.0)
Hemoglobin: 12.9 g/dL (ref 11.7–15.5)
MCH: 29.1 pg (ref 27.0–33.0)
MCHC: 32.7 g/dL (ref 32.0–36.0)
MCV: 89.2 fL (ref 80.0–100.0)
MPV: 8.3 fL (ref 7.5–11.5)
Platelets: 316 10*3/uL (ref 140–400)
RBC: 4.42 10*6/uL (ref 3.80–5.10)
RDW: 13.7 % (ref 11.0–15.0)
WBC: 5 10*3/uL (ref 3.8–10.8)

## 2024-11-02 LAB — FACTOR 9 ASSAY: Factor IX Activity: 110 %{normal} (ref 60–160)

## 2024-11-02 LAB — MIXING STUDY, PT
INR: 0.9 (ref 0.9–1.1)
Protime: 12.9 s (ref 12.1–15.1)

## 2024-11-02 LAB — FACTOR 5 LEIDEN, FACTOR 2 DNA BY PCR
Factor II, DNA Analysis: NORMAL
Factor V Leiden: NORMAL

## 2024-11-02 LAB — IRON STUDIES
% Iron Saturation: 40.3 % (ref 15.0–55.0)
Iron: 129 ug/dL (ref 50–212)
TIBC: 320 ug/dL (ref 265–497)

## 2024-11-02 LAB — APTT: aPTT: 25.3 s — ABNORMAL LOW (ref 25.5–35.0)

## 2024-11-02 LAB — FACTOR 8 ASSAY: Factor VIII Activity: 172 %{normal} (ref 50–180)

## 2024-11-02 LAB — FIBRINOGEN: Fibrinogen: 314 mg/dL (ref 218–406)

## 2024-11-02 LAB — FERRITIN: Ferritin: 60.3 ng/mL (ref 11.0–306.8)

## 2024-11-02 LAB — PLATELET FUNCTION ASPIRIN: Platelet Function Aspirin: 425 {ARU}

## 2024-11-02 LAB — EQUAL MIX, APTT: aPTT: 25.2 s — ABNORMAL LOW (ref 25.5–35.0)

## 2024-11-02 LAB — VON WILLEBRAND FUNCTION (RISTOCETIN): Von Willebrand Factor, Func: 149 % (ref 50–170)

## 2024-11-02 LAB — VON WILLEBRAND ANTIGEN: Von Willebrand Ag: 142 % (ref 50–160)

## 2024-11-02 LAB — THROMBIN TIME: Thrombin Time: 17.9 s (ref 14.6–20.5)

## 2024-11-02 NOTE — Progress Notes (Signed)
 [facility] Hematology Oncology  [facility]  Benign Hematology Clinic    PATIENT DETAILS  Legal Name: Mary [name]  Preferred Name: Mary [name]    AGE/SEX: 54 y.o.,female  DOB: July 23, 1971  MRN: [id number]    Referring Provider     Attending Provider Unknown  No address on file    Primary Care Provider     Ila MARLA Stank, MD    Chief Complaint / Reason for Follow-Up     The principal reason for today's visit is easy bruising.    Brief Summary of Disease & Interval Updates     Mary [name] is a 54 y.o. female who was referred to the hematology clinic by her PCP for easy and persistent bruising and surgery clearance for procedure related to hemorrhoid removal.     Today Mary [name] reports she has struggled with easy bruising most of her adult-life. Did not struggle with easy bruising in childhood. No one else in her family or her children struggle with easy bruising. She did not experience any post-partum hemorrhages or significant bleeding with any other previous surgeries. Denies any significant epistaxis or gum bleeding. Does have blood in stool secondary to hemorrhoids; underwent colonoscopy that was unremarkable. Does note menorrhagia prior to undergoing ablation but like the bruising, menorrhagia was not present when younger. States this was more of an issue within the past 10-15 years.     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 implant rupture     AUGMENTATION MAMMAPLASTY  age [age]    [place], [place]     BIOPSY BREAST NEEDLE LOCALIZATION Right 06/02/2016    Procedure: RIGHT BREAST NEEDLE LOCALIZED EXCISIONAL BIOPSY OF 2 AREAS,   ;  Surgeon: Ami JONETTA Kerns, MD;  Location: [facility] OR;  Service: General;  Laterality: Right;    BREAST EXCISIONAL BIOPSY      atypical cells    CERVICAL CONE BIOPSY      COLONOSCOPY N/A 10/08/2024    Procedure: COLONOSCOPY W/ OR W/O BIOPSY;  Surgeon: [name], MD;  Location: [facility] ENDOSCOPY;  Service: Gastroenterology;  Laterality: N/A;    COLONOSCOPY N/A 10/08/2024    Procedure: COLONOSCOPY LOWER POLYP REMOVAL SNARE OR CAUTERY;  Surgeon: [name], MD;  Location: [facility] ENDOSCOPY;  Service: Gastroenterology;  Laterality: N/A;    COLONOSCOPY N/A 10/08/2024    Procedure: COLONOSCOPY WITH COLD FORCEP POLYPECTOMY;  Surgeon: [name], MD;  Location: [facility] ENDOSCOPY;  Service: Gastroenterology;  Laterality: N/A;    COSMETIC SURGERY      ENDOMETRIAL BIOPSY      benign polyp    FUNCTONAL ENDO SINUS SURGERY WITH LANDMARK Bilateral 02/03/2022    Procedure: endoscopic sinus surgery with septoplasty and inferior  turbinate reductions.;  Surgeon: Dominique Section, MD;  Location: [facility] OR;  Service: ENT;  Laterality: Bilateral;    HYSTEROSCOPY ABLATION ENDOMETRIAL N/A 06/02/2016    Procedure: HYSTEROSCOPIC RESECTION OF ENDOMETRIAL POLYP;  Surgeon: Fairy Reams;  Location: [facility] OR;  Service: Gynecology;  Laterality: N/A;    uterine biopsy       x2       Family History   Problem Relation Age of Onset    Uterine Cancer Mother 36        uterine vs cervical     Hypertension Father     Asthma Father     Alcohol abuse Father     Lung Cancer Father     Colon polyps Father     Bone Cancer Paternal Grandmother 68    Asthma Other     Allergies Other     Melanoma Neg Hx     Breast Cancer Neg Hx     Ovarian cancer Neg Hx     Colon Cancer Neg Hx     Pancreatic Cancer Neg Hx     Prostate Cancer Neg Hx     Anesthesia problems Neg Hx        Social History     Occupational History    Not on file    Tobacco Use    Smoking status: Never    Smokeless tobacco: Never    Tobacco comments:     02/24/2011; passive smoke exposure:no (06/18/2005)   Vaping Use    Vaping status: Never Used   Substance and Sexual Activity    Alcohol use: Yes     Alcohol/week: 4.0 standard drinks of alcohol     Types: 4 Glasses of wine per week     Comment: socially    Drug use: Yes     Types: Medical Marijuana     Comment: 01/07/2011    Sexual activity: Yes     Partners: Female     Comment: vasectomy       The following portions of the patient's history were reviewed and updated as appropriate: allergies, current medications, past family history, past medical history, past social history, past surgical history and problem list.    Current Outpatient Medications     Current Outpatient Medications   Medication Sig    albuterol  Inhale 2 puffs into the lungs every 6 hours as needed for Wheezing.    AMOXicillin -clavulanate Take 1 tablet by mouth every 12 hours.    azelastine  Use 1 spray into each nostril 2 times a day. Use in each nostril as directed    buPROPion  XL Take 1 tablet (150 mg total) by mouth daily.    cetirizine  Take 1 tablet (10 mg total) by mouth daily as needed for Allergies.    cholecalciferol (vitamin D3) Take by mouth.    dextroamphetamine -amphetamine  Take 1 tablet (20 mg total) by mouth every morning.    dextroamphetamine -amphetamine  Take 1 tablet (20 mg total) by mouth every morning.    [START ON 11/06/2024] dextroamphetamine -amphetamine  Take 1 tablet (20 mg total) by mouth every morning.    escitalopram  oxalate Take 1 tablet (20 mg total) by mouth daily.    FERROUS SULFATE (IRON ORAL) Take by mouth.    fluticasone  propionate SHAKE LIQUID AND USE 1 TO 2 SPRAYS IN EACH NOSTRIL DAILY AS NEEDED    Trelegy Ellipta  Inhale 1 puff into the lungs daily.    montelukast  Take 1 tablet (10 mg total) by mouth at bedtime.    omeprazole   Take 1 capsule (40 mg total) by mouth every morning before breakfast.    polyethylene glycol PATIENT IS  TO FOLLOW THE [facility] PREP INSTRUCTIONS according to the instructions outlined in your prep letter from [facility].    triamcinolone  Use 2 sprays into each nostril 2 times a day. Indications: please dispense 2 bottles     No current facility-administered medications for this visit.       Review of Systems     Review of Systems   Constitutional:  Negative for chills and fever.   HENT:  Negative for nosebleeds.    Cardiovascular:  Negative for chest pain, palpitations and leg swelling.   Gastrointestinal:  Positive for blood in stool. Negative for abdominal pain, nausea and vomiting.        2/2 hemorrhoids   Genitourinary:  Negative for hematuria.   Endo/Heme/Allergies:  Bruises/bleeds easily.        Vital Signs     Blood pressure 155/82, pulse 97, temperature 98.5 ??F (36.9 ??C), temperature source Temporal, resp. rate 18, height 5' 6 (1.676 m), weight 169 lb (76.7 kg), SpO2 98%.      Physical Exam     Physical Exam  Constitutional:       General: She is not in acute distress.     Appearance: Normal appearance. She is not ill-appearing.   HENT:      Head: Normocephalic and atraumatic.   Eyes:      Extraocular Movements: Extraocular movements intact.      Pupils: Pupils are equal, round, and reactive to light.   Cardiovascular:      Rate and Rhythm: Normal rate.   Pulmonary:      Effort: Pulmonary effort is normal.   Musculoskeletal:         General: Normal range of motion.   Neurological:      General: No focal deficit present.      Mental Status: She is alert and oriented to person, place, and time. Mental status is at baseline.   Psychiatric:         Mood and Affect: Mood normal.         Behavior: Behavior normal.         Thought Content: Thought content normal.          Laboratory Data     Complete Blood Count (most recent)  Lab Results   Component Value Date    WBC 4.6 08/30/2023    HGB 13.3 08/30/2023    HCT 40.0 08/30/2023    MCV 90.0 08/30/2023    PLT 290 08/30/2023     Renal Panel  Lab Results   Component Value Date     NA 138 12/22/2022    K 4.2 12/22/2022    CL 100 12/22/2022    CO2 26 12/22/2022    ANIONGAP 12 12/22/2022    BUN 11 12/22/2022    CREATININE 0.72 12/22/2022    EGFRAACKDEPI >90 10/01/2020    EGFRNONACKDE >90 10/01/2020    GLUCOSE 101 (H) 12/22/2022    CALCIUM 9.3 12/22/2022     Liver Panel  Lab Results   Component Value Date    BILITOT 1.3 12/22/2022    BILIDIRECT 0.1 09/30/2017    AST 30 09/30/2017    ALT 11 12/22/2022    ALKPHOS 43 12/22/2022    PROT 7.2 12/22/2022    ALBUMIN 4.0 12/22/2022       Assessment & Plan     Mary [name] is a 54  y.o. female seen for NPV    PROBLEMS ADDRESSED  #Easy bruising    PLAN SUMMARY  - Discussed with patient that we will obtain labs to ensure she does not have any underlying bleeding disorder and consequently grant clearance for procedure  - Will reach out with results and next steps  - Mary [name] verbalized understanding and agreed with plan    RTC: TBD      Signed:  Glennon Pinal, CNP    I spent 60 minutes on the day of patient's visit reviewing patient chart in addition to seeing patient during visit. In reviewing the patient's chart time was taken to review past notes, lab values, imaging, and treatment plans. Time was taken during the visit to educate the patient as well as review labs and answer questions and concerns.       Medical Decision Making:  The following items were considered in medical decision making:  Obtain records and history from outside facility/provider  Review / order clinical lab tests  Review / order radiology tests  Review / order other diagnostic tests/interventions  Reviewed outside records  Medication toxicity monitoring performed  Discuss with Attending Physician       Number and Complexity of Problems (Level 4)  During this encounter, I addressed 1 or more chronic illnesses with exacerbation, progression, or side effects of treatment      Amount and/or Complexity of Data Ordered, Reviewed, or Analyzed (Level 4)  I reviewed 2 external note(s)  from:.  I ordered 3+ test(s) including:.      Risk of Complication and/or Morbidity or Mortality of Patient Management (Level  4)  The patient's management entails Moderate risk of complications and/or morbidity or mortality.    Overall LOS:  4
